# Patient Record
Sex: Male | Born: 1940
Health system: Southern US, Community
[De-identification: ages and names within clinical notes are randomized; demographics above are authoritative.]

## PROBLEM LIST (undated history)

## (undated) DIAGNOSIS — M25579 Pain in unspecified ankle and joints of unspecified foot: Secondary | ICD-10-CM

## (undated) DIAGNOSIS — N200 Calculus of kidney: Secondary | ICD-10-CM

## (undated) DIAGNOSIS — I4891 Unspecified atrial fibrillation: Secondary | ICD-10-CM

## (undated) DIAGNOSIS — E119 Type 2 diabetes mellitus without complications: Secondary | ICD-10-CM

## (undated) DIAGNOSIS — I2510 Atherosclerotic heart disease of native coronary artery without angina pectoris: Secondary | ICD-10-CM

## (undated) DIAGNOSIS — H269 Unspecified cataract: Secondary | ICD-10-CM

## (undated) DIAGNOSIS — M545 Low back pain: Secondary | ICD-10-CM

## (undated) DIAGNOSIS — E785 Hyperlipidemia, unspecified: Secondary | ICD-10-CM

## (undated) DIAGNOSIS — E669 Obesity, unspecified: Secondary | ICD-10-CM

## (undated) DIAGNOSIS — C649 Malignant neoplasm of unspecified kidney, except renal pelvis: Secondary | ICD-10-CM

## (undated) DIAGNOSIS — E039 Hypothyroidism, unspecified: Secondary | ICD-10-CM

## (undated) DIAGNOSIS — I10 Essential (primary) hypertension: Secondary | ICD-10-CM

## (undated) HISTORY — DX: Hyperlipidemia, unspecified: E78.5

## (undated) HISTORY — DX: Calculus of kidney: N20.0

## (undated) HISTORY — DX: Unspecified atrial fibrillation: I48.91

## (undated) HISTORY — DX: Obesity, unspecified: E66.9

## (undated) HISTORY — DX: Unspecified cataract: H26.9

## (undated) HISTORY — DX: Essential (primary) hypertension: I10

## (undated) HISTORY — PX: CORONARY ANGIOPLASTY WITH STENT PLACEMENT: SHX49

## (undated) HISTORY — DX: Type 2 diabetes mellitus without complications: E11.9

## (undated) HISTORY — DX: Malignant neoplasm of unspecified kidney, except renal pelvis: C64.9

## (undated) HISTORY — DX: Pain in unspecified ankle and joints of unspecified foot: M25.579

## (undated) HISTORY — DX: Low back pain: M54.5

## (undated) HISTORY — PX: ANKLE SURGERY: SHX546

## (undated) HISTORY — DX: Hypothyroidism, unspecified: E03.9

## (undated) HISTORY — DX: Atherosclerotic heart disease of native coronary artery without angina pectoris: I25.10

## (undated) HISTORY — PX: ROTATOR CUFF REPAIR: SHX139

---

## 2005-06-29 LAB — HM SIGMOIDOSCOPY: HM Sigmoidoscopy: NORMAL

## 2009-06-29 ENCOUNTER — Ambulatory Visit: Payer: Self-pay | Admitting: Gastroenterology

## 2010-09-05 DIAGNOSIS — E119 Type 2 diabetes mellitus without complications: Secondary | ICD-10-CM

## 2010-09-05 DIAGNOSIS — E039 Hypothyroidism, unspecified: Secondary | ICD-10-CM

## 2010-09-05 DIAGNOSIS — C649 Malignant neoplasm of unspecified kidney, except renal pelvis: Secondary | ICD-10-CM

## 2010-09-05 DIAGNOSIS — N200 Calculus of kidney: Secondary | ICD-10-CM

## 2010-09-05 DIAGNOSIS — I10 Essential (primary) hypertension: Secondary | ICD-10-CM

## 2010-09-05 HISTORY — DX: Hypothyroidism, unspecified: E03.9

## 2010-09-05 HISTORY — DX: Type 2 diabetes mellitus without complications: E11.9

## 2010-09-05 HISTORY — DX: Malignant neoplasm of unspecified kidney, except renal pelvis: C64.9

## 2010-09-05 HISTORY — DX: Calculus of kidney: N20.0

## 2010-09-05 HISTORY — DX: Essential (primary) hypertension: I10

## 2010-11-09 ENCOUNTER — Other Ambulatory Visit (HOSPITAL_COMMUNITY): Payer: Self-pay | Admitting: Urology

## 2010-11-09 ENCOUNTER — Ambulatory Visit (HOSPITAL_COMMUNITY)
Admission: RE | Admit: 2010-11-09 | Discharge: 2010-11-09 | Disposition: A | Discharge status: Home / Self Care | Payer: MEDICARE | Source: Ambulatory Visit | Attending: Urology | Admitting: Urology

## 2010-11-09 DIAGNOSIS — D4959 Neoplasm of unspecified behavior of other genitourinary organ: Secondary | ICD-10-CM | POA: Insufficient documentation

## 2010-11-09 DIAGNOSIS — I10 Essential (primary) hypertension: Secondary | ICD-10-CM | POA: Insufficient documentation

## 2010-11-09 DIAGNOSIS — R0602 Shortness of breath: Secondary | ICD-10-CM | POA: Insufficient documentation

## 2010-11-09 DIAGNOSIS — D49519 Neoplasm of unspecified behavior of unspecified kidney: Secondary | ICD-10-CM

## 2010-12-05 HISTORY — PX: KIDNEY SURGERY: SHX687

## 2010-12-28 ENCOUNTER — Encounter (HOSPITAL_COMMUNITY): Payer: MEDICARE | Attending: Urology

## 2010-12-28 LAB — BASIC METABOLIC PANEL
BUN: 20 mg/dL (ref 6–23)
Chloride: 100 mEq/L (ref 96–112)
Creatinine, Ser: 1.26 mg/dL (ref 0.4–1.5)
GFR calc Af Amer: >60 mL/min (ref >60)
GFR calc non Af Amer: 57 mL/min — ABNORMAL LOW (ref >60)
Potassium: 3.8 mEq/L (ref 3.5–5.1)

## 2010-12-28 LAB — CBC
Platelets: 355 10*3/uL (ref 150–400)
RBC: 5.1 MIL/uL (ref 4.22–5.81)
RDW: 14.2 % (ref 11.5–15.5)
WBC: 10.7 10*3/uL — ABNORMAL HIGH (ref 4.0–10.5)

## 2010-12-28 LAB — SURGICAL PCR SCREEN
MRSA, PCR: NEGATIVE
Staphylococcus aureus: NEGATIVE

## 2011-01-03 ENCOUNTER — Inpatient Hospital Stay (HOSPITAL_COMMUNITY)
Admission: RE | Admit: 2011-01-03 | Discharge: 2011-01-06 | DRG: 658 | Disposition: A | Discharge status: Home / Self Care | Payer: Medicare Other | Source: Ambulatory Visit | Attending: Urology | Admitting: Urology

## 2011-01-03 ENCOUNTER — Other Ambulatory Visit: Payer: Self-pay | Admitting: Urology

## 2011-01-03 DIAGNOSIS — I10 Essential (primary) hypertension: Secondary | ICD-10-CM | POA: Diagnosis present

## 2011-01-03 DIAGNOSIS — Z5331 Laparoscopic surgical procedure converted to open procedure: Secondary | ICD-10-CM

## 2011-01-03 DIAGNOSIS — E119 Type 2 diabetes mellitus without complications: Secondary | ICD-10-CM | POA: Diagnosis present

## 2011-01-03 DIAGNOSIS — J45909 Unspecified asthma, uncomplicated: Secondary | ICD-10-CM | POA: Diagnosis present

## 2011-01-03 DIAGNOSIS — C649 Malignant neoplasm of unspecified kidney, except renal pelvis: Principal | ICD-10-CM | POA: Diagnosis present

## 2011-01-03 DIAGNOSIS — E785 Hyperlipidemia, unspecified: Secondary | ICD-10-CM | POA: Diagnosis present

## 2011-01-03 DIAGNOSIS — K219 Gastro-esophageal reflux disease without esophagitis: Secondary | ICD-10-CM | POA: Diagnosis present

## 2011-01-03 DIAGNOSIS — E039 Hypothyroidism, unspecified: Secondary | ICD-10-CM | POA: Diagnosis present

## 2011-01-03 LAB — BASIC METABOLIC PANEL
BUN: 13 mg/dL (ref 6–23)
CO2: 24 mEq/L (ref 19–32)
Glucose, Bld: 140 mg/dL — ABNORMAL HIGH (ref 70–99)
Potassium: 3.9 mEq/L (ref 3.5–5.1)
Sodium: 131 mEq/L — ABNORMAL LOW (ref 135–145)

## 2011-01-03 LAB — GLUCOSE, CAPILLARY
Glucose-Capillary: 128 mg/dL — ABNORMAL HIGH (ref 70–99)
Glucose-Capillary: 129 mg/dL — ABNORMAL HIGH (ref 70–99)

## 2011-01-03 LAB — TYPE AND SCREEN: Antibody Screen: NEGATIVE

## 2011-01-03 LAB — ABO/RH: ABO/RH(D): A POS

## 2011-01-04 LAB — GLUCOSE, CAPILLARY
Glucose-Capillary: 103 mg/dL — ABNORMAL HIGH (ref 70–99)
Glucose-Capillary: 121 mg/dL — ABNORMAL HIGH (ref 70–99)
Glucose-Capillary: 125 mg/dL — ABNORMAL HIGH (ref 70–99)

## 2011-01-04 LAB — BASIC METABOLIC PANEL
BUN: 11 mg/dL (ref 6–23)
CO2: 25 mEq/L (ref 19–32)
Chloride: 97 mEq/L (ref 96–112)
Glucose, Bld: 114 mg/dL — ABNORMAL HIGH (ref 70–99)
Potassium: 3.8 mEq/L (ref 3.5–5.1)

## 2011-01-05 LAB — GLUCOSE, CAPILLARY
Glucose-Capillary: 119 mg/dL — ABNORMAL HIGH (ref 70–99)
Glucose-Capillary: 119 mg/dL — ABNORMAL HIGH (ref 70–99)
Glucose-Capillary: 125 mg/dL — ABNORMAL HIGH (ref 70–99)

## 2011-01-05 LAB — BASIC METABOLIC PANEL
BUN: 6 mg/dL (ref 6–23)
CO2: 27 mEq/L (ref 19–32)
Chloride: 95 mEq/L — ABNORMAL LOW (ref 96–112)
Creatinine, Ser: 0.95 mg/dL (ref 0.4–1.5)
GFR calc Af Amer: >60 mL/min (ref >60)

## 2011-01-07 LAB — GLUCOSE, CAPILLARY
Glucose-Capillary: 142 mg/dL — ABNORMAL HIGH (ref 70–99)
Glucose-Capillary: 118 mg/dL — ABNORMAL HIGH (ref 70–99)
Glucose-Capillary: 153 mg/dL — ABNORMAL HIGH (ref 70–99)

## 2011-01-08 NOTE — Op Note (Signed)
NAMEJEARL, Nicholas Rivera NO.:  [id number]  MEDICAL RECORD NO.:  [id number]           PATIENT TYPE:  I  LOCATION:  1418                         FACILITY:  [facility]  PHYSICIAN:  Heloise Purpura, MD      DATE OF BIRTH:  Nov 10, 1940  DATE OF PROCEDURE: DATE OF DISCHARGE:                              OPERATIVE REPORT   DATE OF PROCEDURE:  January 03, 2011  PREOPERATIVE DIAGNOSIS:  Left renal neoplasm.  POSTOPERATIVE DIAGNOSIS:  Left renal neoplasm.  PROCEDURE:  Left laparoscopic partial nephrectomy converted to open left partial nephrectomy.  SURGEON:  Dr. Heloise Purpura.  ASSISTANT:  Delia Chimes, NPC  ANESTHESIA:  General.  COMPLICATIONS:  None.  ESTIMATED BLOOD LOSS:  75 mL.  INTRAVENOUS FLUIDS:  3 liters of lactated Ringer.  SPECIMENS: 1. Left perinephric fat. 2. Left renal neoplasm. 3. Left renal tumor margins.  DISPOSITION:  Specimen to pathology.  INTRAOPERATIVE FINDINGS: 1. Intraoperative tumor margins were negative on frozen section     analysis. 2. Warm left renal ischemia times 18 minutes. 3. The left renal neoplasm was confirmed by Pathology to represent the     lesion of concern.  INDICATIONS:  [name] is a 70 year old gentleman who was found to have an incidentally detected enhancing left renal neoplasm concerning for malignancy.  He underwent a metastatic evaluation, which was negative. After discussing management options for treatment, he elected to proceed with surgical resection and a planned left robotic assisted laparoscopic partial nephrectomy.  The potential risks, complications and alternative treatment options were discussed in detail and informed consent obtained.  DESCRIPTION OF PROCEDURE:  Prior to bringing the patient back to the operating room, there was noted to be a fault with the robot.  This was therefore discussed with technical support and troubleshooting occurred.  It was felt that this was fixed with replacement  of a new camera cable. Utilizing the preoperative diagnostics, the robot appeared to be functioning well.  The patient was therefore brought back to the operating room and general anesthesia was induced. He was administered preoperative antibiotics and a preoperative time out was performed. His abdomen was prepped and draped in the usual sterile fashion.  At this point, a similar non-recoverable fault was identified.  Again technical support was contacted and despite further troubleshooting, it was felt that this represented a problem that could not be remedied immediately.  It was felt that it would require a new part, which would not be available for several hours.  It was therefore decided to proceed with surgery laparoscopically at this point.  A camera port site was selected off to the left of the umbilicus in the left upper quadrant and an open Hassan technique was used to enter the peritoneal cavity under direct vision without difficulty.  0 Vicryl holding sutures were placed and a 12-mm Hassan cannula was secured to these holding sutures.  A 30- degree lens was then used to inspect the abdomen, there were noted be multiple omental adhesions along the left side of the abdominal wall, thereby obscuring the left kidney and descending colon.  A 12 mm port was then  placed in the left lower quadrant and a 5-mm port was placed in the left upper quadrant.  The aforementioned adhesions were then taken down with a harmonic scalpel.  Once these adhesions were taken down, the colon was able to be identified and the white line of Toldt was incised along the length of the descending colon allowing the colon be mobilized medially in the space between the mesocolon and the anterior layer of Gerota's fascia to be developed.  The ureter was identified inferiorly and this was able to be lifted anteriorly off the psoas muscle. Dissection then proceeded superiorly toward the main renal hilum where  a single renal artery was identified and isolated with right-angle dissection.  A single left renal vein was also identified just superiorly to the artery.  Both of these vessels were isolated from the surrounding structures.  12.5 grams of intravenous mannitol was administered.  The perinephric fat was then incised over the anterior aspect of the kidney, allowing the inferior pole to be isolated from the surrounding perinephric fat.  However, the patient was noted to have very adherent perinephric fat in the anterior lower pole, which was the part of the kidney where the tumor was located, it was unable to be readily visualized despite extensive dissection.  At this point, it was felt that it would be in the patient's best interest to make an open incision for further evaluation of the kidney and subsequent resection. Therefore the previously placed laparoscopic ports were identified and closed with 0 Vicryl sutures placed laparoscopically prior to removal of these ports.  The 5 mm port in the left upper quadrant was then extended laterally in the subcostal region and a self-retaining Bookwalter retractor was placed.  The left kidney was further mobilized with the lateral and posterior attachments taken down with electrocautery.  The lower and anterior aspect of the left kidney was carefully examined and the patient's left renal tumor was still not well visualized although it was felt to be in the anterior lowe pole in a specific area only much more endophytic than expected based on his imaging.  Based on the patient's imaging, the tumor site was felt to be located just below some very adherent perinephric fat and the anterior aspect of the lower pole. With no further lesions identified, it was felt that this particular area represented the lesion of interest and preparations were made for excision.  The single renal artery was clamped with a bulldog clamp and cold scissor dissection was  used to excise the presumed tumor in the left lower pole.  This appeared to be excised in its entirety grossly and excision continued down to the renal sinus fat.  This specimen was then removed.  Additional margins from the representative section of the underlying parenchyma were excised and sent for frozen section analysis.  The frozen section subsequently returned negative for tumor.  I also asked Dr. Luisa Hart in Pathology to examine the excised tumor specimen grossly and he did confirm that there was a calcified cystic tumor within this parenchyma confirming that this was the lesion of concern. Preparations were then made for repair of the renal defect.  A running 4- 0 Vicryl suture was used to close the collecting system and this was secured with lap ties.  A running 2-0 Vicryl suture was then used to reapproximate the parenchyma again with lap ties used to secure these sutures.  A Surgicel bolster was then placed into the defect and FloSeal was placed underneath the  bolster.  The bolster was then secured with a double-armed 2-0 Monocryl suture, which was secured with a combination of 5 mm Hem-o-lok clips and lap ties.  This resulted in good compression of the renal defect.  The bulldog clamp was then removed and hemostasis appeared excellent.  Total warm ischemia time was 18 minutes.  An additional 12.5 grams of intravenous mannitol was then administered. The kidney was placed back into its normal anatomic position and the bowel was placed back over the kidney in its normal anatomic position.  A #15 round Blake drain was then brought through a separate stab incision in the left lower quadrant and positioned appropriately under direct guidance in the perinephric space.  It was secured to the skin with a nylon suture.  The layers of the abdominal wall and fascia were then closed in two separate layers.  The transversus abdominus and internal oblique fascial layers were closed with a  running looped #1 PDS suture. The external oblique fascia was then closed with a second running #1 PDS suture.  This wound was then copiously irrigated and reapproximated with a running 3-0 Vicryl subcutaneous layer.  4-0 Vicryl subcuticular sutures were then used to reapproximate the skin and Steri-Strips were applied.  The patient appeared to tolerate the procedure well without complications.  He was able to be extubated and transferred to the recovery unit in satisfactory condition.     Heloise Purpura, MD     LB/MEDQ  D:  01/03/2011  T:  01/04/2011  Job:  [id number]  Electronically Signed by Heloise Purpura MD on 01/08/2011 04:19:48 PM

## 2011-06-24 ENCOUNTER — Other Ambulatory Visit (HOSPITAL_COMMUNITY): Payer: Self-pay | Admitting: Urology

## 2011-06-24 ENCOUNTER — Ambulatory Visit (HOSPITAL_COMMUNITY)
Admission: RE | Admit: 2011-06-24 | Discharge: 2011-06-24 | Disposition: A | Discharge status: Home / Self Care | Payer: Medicare Other | Source: Ambulatory Visit | Attending: Urology | Admitting: Urology

## 2011-06-24 DIAGNOSIS — I10 Essential (primary) hypertension: Secondary | ICD-10-CM | POA: Insufficient documentation

## 2011-06-24 DIAGNOSIS — E119 Type 2 diabetes mellitus without complications: Secondary | ICD-10-CM | POA: Insufficient documentation

## 2011-06-24 DIAGNOSIS — C649 Malignant neoplasm of unspecified kidney, except renal pelvis: Secondary | ICD-10-CM | POA: Insufficient documentation

## 2011-09-06 DIAGNOSIS — H269 Unspecified cataract: Secondary | ICD-10-CM

## 2011-09-06 HISTORY — DX: Unspecified cataract: H26.9

## 2011-12-30 ENCOUNTER — Other Ambulatory Visit (HOSPITAL_COMMUNITY): Payer: Self-pay | Admitting: Urology

## 2011-12-30 ENCOUNTER — Ambulatory Visit (HOSPITAL_COMMUNITY)
Admission: RE | Admit: 2011-12-30 | Discharge: 2011-12-30 | Disposition: A | Discharge status: Home / Self Care | Payer: Medicare Other | Source: Ambulatory Visit | Attending: Urology | Admitting: Urology

## 2011-12-30 DIAGNOSIS — C649 Malignant neoplasm of unspecified kidney, except renal pelvis: Secondary | ICD-10-CM

## 2012-07-11 ENCOUNTER — Other Ambulatory Visit (HOSPITAL_COMMUNITY): Payer: Self-pay | Admitting: Urology

## 2012-07-11 ENCOUNTER — Ambulatory Visit (HOSPITAL_COMMUNITY)
Admission: RE | Admit: 2012-07-11 | Discharge: 2012-07-11 | Disposition: A | Discharge status: Home / Self Care | Payer: Medicare Other | Source: Ambulatory Visit | Attending: Urology | Admitting: Urology

## 2012-07-11 DIAGNOSIS — N401 Enlarged prostate with lower urinary tract symptoms: Secondary | ICD-10-CM

## 2012-07-11 DIAGNOSIS — N138 Other obstructive and reflux uropathy: Secondary | ICD-10-CM | POA: Insufficient documentation

## 2012-07-11 DIAGNOSIS — E119 Type 2 diabetes mellitus without complications: Secondary | ICD-10-CM | POA: Insufficient documentation

## 2012-07-11 DIAGNOSIS — I10 Essential (primary) hypertension: Secondary | ICD-10-CM | POA: Insufficient documentation

## 2012-07-11 DIAGNOSIS — R339 Retention of urine, unspecified: Secondary | ICD-10-CM | POA: Insufficient documentation

## 2012-09-03 DIAGNOSIS — M545 Low back pain, unspecified: Secondary | ICD-10-CM | POA: Insufficient documentation

## 2012-09-05 DIAGNOSIS — M545 Low back pain, unspecified: Secondary | ICD-10-CM

## 2012-09-05 HISTORY — DX: Low back pain, unspecified: M54.50

## 2012-12-25 ENCOUNTER — Ambulatory Visit (INDEPENDENT_AMBULATORY_CARE_PROVIDER_SITE_OTHER): Payer: Medicare Other | Admitting: Internal Medicine

## 2012-12-25 ENCOUNTER — Encounter: Payer: Self-pay | Admitting: Internal Medicine

## 2012-12-25 VITALS — BP 110/72 | HR 63 | Temp 98.2°F | Resp 12 | Ht 66.0 in | Wt 233.0 lb

## 2012-12-25 DIAGNOSIS — M533 Sacrococcygeal disorders, not elsewhere classified: Secondary | ICD-10-CM

## 2012-12-25 DIAGNOSIS — M545 Low back pain, unspecified: Secondary | ICD-10-CM

## 2012-12-25 DIAGNOSIS — E119 Type 2 diabetes mellitus without complications: Secondary | ICD-10-CM

## 2012-12-25 DIAGNOSIS — E039 Hypothyroidism, unspecified: Secondary | ICD-10-CM

## 2012-12-25 DIAGNOSIS — E785 Hyperlipidemia, unspecified: Secondary | ICD-10-CM

## 2012-12-25 DIAGNOSIS — I10 Essential (primary) hypertension: Secondary | ICD-10-CM | POA: Insufficient documentation

## 2012-12-25 DIAGNOSIS — R972 Elevated prostate specific antigen [PSA]: Secondary | ICD-10-CM

## 2012-12-25 DIAGNOSIS — E1169 Type 2 diabetes mellitus with other specified complication: Secondary | ICD-10-CM | POA: Insufficient documentation

## 2012-12-25 DIAGNOSIS — C649 Malignant neoplasm of unspecified kidney, except renal pelvis: Secondary | ICD-10-CM

## 2012-12-25 DIAGNOSIS — N209 Urinary calculus, unspecified: Secondary | ICD-10-CM

## 2012-12-25 DIAGNOSIS — E669 Obesity, unspecified: Secondary | ICD-10-CM

## 2012-12-25 MED ORDER — OMEPRAZOLE 20 MG PO CPDR: DELAYED_RELEASE_CAPSULE | ORAL | Status: DC

## 2012-12-25 MED ORDER — METFORMIN HCL 500 MG PO TABS: ORAL_TABLET | ORAL | Status: DC

## 2012-12-25 NOTE — Patient Instructions (Signed)
Continue current medications. 

## 2012-12-25 NOTE — Progress Notes (Signed)
Date: 12/25/2012  MRN:  [id number] Name:  Nicholas Rivera Sex:  male Age:  72 y.o. DOB:1941-08-03   Level Of Care: Independent   Emergency Contacts: Contact Information   Name Relation Home Work Mobile   Nicholas Rivera Spouse [phone]     Nicholas, Rivera [phone]  [phone]      Code Status: Full code  Allergies:No Known Allergies   Chief Complaint  Patient presents with  . establish with practice     HPI:  Diabetes mellitus without complication: generally under good control  Hypertension: Controlled  Unspecified hypothyroidism: Has been normal on supplements  Cancer of kidney, unspecified laterality: Removed surgically by Dr. Laverle Patter. No signs of relapse.  Obesity, unspecified: Denies recent weight gain or loss. He did weigh less than 200 pounds in 2009  Lumbar back pain: This renal lower back since he fell in his care. His muscle relaxer. No radiation of pain in the legs. Eating has helped.  Other and unspecified hyperlipidemia  PSA elevation    Past Medical History  Diagnosis Date  . Diabetes mellitus without complication 2012  . Hypertension 2012  . Unspecified hypothyroidism 2012  . Nephrolithiasis 2012  . Cancer of kidney 2012  . Obesity, unspecified   . Cataract 2013  . Other and unspecified hyperlipidemia 04/03/2013  . Back pain, lumbosacral 2014    Past Surgical History  Procedure Laterality Date  . Kidney surgery  12/2010    had cancer removed; Dr. Laverle Patter  . Rotator cuff repair      bilateral; Dr. Ranell Patrick     Procedures:   Consultants: Urology: Dr. Laverle Patter Orthopedics: Dr. Ranell Patrick   Current Outpatient Prescriptions  Medication Sig Dispense Refill  . aspirin 325 MG tablet Take 325 mg by mouth daily. Take one tablet by mouth daily.      . bisoprolol-hydrochlorothiazide (ZIAC) 5-6.25 MG per tablet Take 1 tablet by mouth daily. Take two tablets by mouth twice daily.      Marland Kitchen levothyroxine (SYNTHROID, LEVOTHROID) 50 MCG tablet  Take 50 mcg by mouth daily before breakfast. Take one tablet by mouth daily.      Marland Kitchen losartan (COZAAR) 100 MG tablet Take 100 mg by mouth daily. Take one tablet by mouth daily.      . metFORMIN (GLUCOPHAGE) 500 MG tablet Take one tablet by mouth twice daily.  180 tablet  4  . Multiple Vitamins-Minerals (MULTIVITAMIN WITH MINERALS) tablet Take 1 tablet by mouth daily. Take one tablet by mouth daily.      Marland Kitchen omeprazole (PRILOSEC) 20 MG capsule Take one capsule by mouth daily to reduce stomach acid.  90 capsule  4  . potassium citrate (UROCIT-K) 10 MEQ (1080 MG) SR tablet Take 10 mEq by mouth 3 (three) times daily with meals. Take two tablets by mouth twice daily.      Marland Kitchen RASPBERRY KETONES PO Take by mouth. Take two tablets by mouth in the morning and two tablets by mouth in the evening for metabolism.      . vitamin C (ASCORBIC ACID) 500 MG tablet Take 500 mg by mouth daily. Take two tablets by mouth daily.       No current facility-administered medications for this visit.    Immunization History  Administered Date(s) Administered  . Influenza-Generic 05/06/2012  . Pneumococcal Polysaccharide 09/05/1998  . Td 09/06/2003     Diet:  Low carbohydrate, no concentrated sweets  History  Substance Use Topics  . Smoking status: Never Smoker   . Smokeless tobacco: Not on file  .  Alcohol Use: No    Family Status  Relation Status Death Age  . Mother Deceased 12    choked  . Father Deceased 56    car accident  . Daughter Alive   . Son Alive       Review of Systems  Constitutional: Negative for fever, chills, activity change and fatigue.  HENT: Negative.   Eyes: Negative.   Respiratory: Negative.   Cardiovascular: Negative for chest pain, palpitations and leg swelling.  Gastrointestinal: Negative.   Endocrine:       Diabetic  Genitourinary: Negative for dysuria, urgency, decreased urine volume and difficulty urinating.       History of kidney cancer removed by Dr. Laverle Patter. History of  bladder stones.  Musculoskeletal:       Low back discomfort. History of bilateral rotator cuff surgery by Dr. Ranell Patrick.  Skin: Negative.   Allergic/Immunologic: Negative.   Neurological: Negative.   Hematological: Negative.   Psychiatric/Behavioral: Negative.      Vital signs: BP 110/72  Pulse 63  Temp(Src) 98.2 F (36.8 C) (Oral)  Resp 12  Ht 5\' 6"  (1.676 m)  Wt 233 lb (105.688 kg)  BMI 37.63 kg/m2  SpO2 97%  Physical Exam  Constitutional: He is oriented to person, place, and time and well-developed, well-nourished, and in no distress.  Moderately overweight.  HENT:  Head: Normocephalic and atraumatic.  Right Ear: External ear normal.  Left Ear: External ear normal.  Nose: Nose normal.  Mouth/Throat: Oropharynx is clear and moist.  Eyes: Conjunctivae and EOM are normal. Pupils are equal, round, and reactive to light.  Corrective lenses  Neck: Neck supple. No JVD present. No tracheal deviation present. No thyromegaly present.  Cardiovascular: Normal rate, regular rhythm, normal heart sounds and intact distal pulses.  Exam reveals no gallop and no friction rub.   No murmur heard. Pulmonary/Chest: Breath sounds normal. No respiratory distress. He has no wheezes. He has no rales.  Abdominal: Bowel sounds are normal. He exhibits no distension. There is no tenderness.  Genitourinary: Rectum normal, prostate normal and penis normal. Guaiac negative stool.  Musculoskeletal: He exhibits no edema and no tenderness.  Mild restriction of movement of the shoulders. Mild back discomfort without restriction of movement.  Lymphadenopathy:    He has no cervical adenopathy.  Neurological: He is oriented to person, place, and time. He has normal reflexes. No cranial nerve deficit. Gait normal. Coordination normal.  Skin: No rash noted. No erythema. No pallor.  Psychiatric: Mood, memory, affect and judgment normal.     Screening Score  MMS    PHQ2 0  PHQ9     Fall Risk    BIMS     Annual summary: Hospitalizations: None last year Infection History: None of significance Functional assessment: Independent in all ADL Areas of potential improvement: None likely Rehabilitation Potential: Not needed Prognosis for survival: Good  Plan: Diabetes mellitus without complication: Continue dietary control and metformin - Plan: Hemoglobin A1c, Lipid Panel, CMP, Microalbumin/Creatinine Ratio, Urine  Hypertension: Controlled  Unspecified hypothyroidism: Controlled on supplements - Plan: TSH  Cancer of kidney, unspecified laterality: No sign of relapse  Obesity, unspecified: Encouraged weight loss and dietary control of portion size and elimination of extra carbohydrates  Lumbago: Continue with mild analgesics as needed. Given advice about back exercises.  Other and unspecified hyperlipidemia: Controlled  PSA elevation: Routine followup with future lab  Urolithiasis: History of bladder stones. Currently asymptomatic.

## 2012-12-26 ENCOUNTER — Other Ambulatory Visit: Payer: Self-pay | Admitting: *Deleted

## 2012-12-26 DIAGNOSIS — I10 Essential (primary) hypertension: Secondary | ICD-10-CM

## 2012-12-26 DIAGNOSIS — E785 Hyperlipidemia, unspecified: Secondary | ICD-10-CM

## 2012-12-26 DIAGNOSIS — E119 Type 2 diabetes mellitus without complications: Secondary | ICD-10-CM

## 2013-01-18 ENCOUNTER — Other Ambulatory Visit (HOSPITAL_COMMUNITY): Payer: Self-pay | Admitting: Urology

## 2013-01-18 ENCOUNTER — Ambulatory Visit (HOSPITAL_COMMUNITY)
Admission: RE | Admit: 2013-01-18 | Discharge: 2013-01-18 | Disposition: A | Discharge status: Home / Self Care | Payer: Medicare Other | Source: Ambulatory Visit | Attending: Urology | Admitting: Urology

## 2013-01-18 DIAGNOSIS — C649 Malignant neoplasm of unspecified kidney, except renal pelvis: Secondary | ICD-10-CM

## 2013-03-28 ENCOUNTER — Other Ambulatory Visit: Payer: Self-pay | Admitting: Internal Medicine

## 2013-03-28 ENCOUNTER — Other Ambulatory Visit: Payer: Medicare Other

## 2013-04-03 ENCOUNTER — Ambulatory Visit (INDEPENDENT_AMBULATORY_CARE_PROVIDER_SITE_OTHER): Payer: Medicare Other | Admitting: Internal Medicine

## 2013-04-03 ENCOUNTER — Encounter: Payer: Self-pay | Admitting: Internal Medicine

## 2013-04-03 VITALS — BP 108/70 | HR 64 | Temp 97.8°F | Resp 18 | Ht 66.0 in | Wt 231.4 lb

## 2013-04-03 DIAGNOSIS — C642 Malignant neoplasm of left kidney, except renal pelvis: Secondary | ICD-10-CM

## 2013-04-03 DIAGNOSIS — R972 Elevated prostate specific antigen [PSA]: Secondary | ICD-10-CM

## 2013-04-03 DIAGNOSIS — M545 Low back pain, unspecified: Secondary | ICD-10-CM

## 2013-04-03 DIAGNOSIS — E039 Hypothyroidism, unspecified: Secondary | ICD-10-CM

## 2013-04-03 DIAGNOSIS — E785 Hyperlipidemia, unspecified: Secondary | ICD-10-CM

## 2013-04-03 DIAGNOSIS — E669 Obesity, unspecified: Secondary | ICD-10-CM

## 2013-04-03 DIAGNOSIS — C649 Malignant neoplasm of unspecified kidney, except renal pelvis: Secondary | ICD-10-CM

## 2013-04-03 DIAGNOSIS — I10 Essential (primary) hypertension: Secondary | ICD-10-CM

## 2013-04-03 DIAGNOSIS — E119 Type 2 diabetes mellitus without complications: Secondary | ICD-10-CM

## 2013-04-03 HISTORY — DX: Hyperlipidemia, unspecified: E78.5

## 2013-04-03 MED ORDER — LEVOTHYROXINE SODIUM 50 MCG PO TABS: 50.0000 ug | ORAL_TABLET | Freq: Every day | ORAL | Status: DC

## 2013-04-03 MED ORDER — LOSARTAN POTASSIUM 100 MG PO TABS: 100.0000 mg | ORAL_TABLET | Freq: Every day | ORAL | Status: DC

## 2013-04-03 MED ORDER — BISOPROLOL-HYDROCHLOROTHIAZIDE 5-6.25 MG PO TABS: 2.0000 | ORAL_TABLET | Freq: Every day | ORAL | Status: DC

## 2013-04-03 NOTE — Patient Instructions (Signed)
Continue current medications. 

## 2013-04-03 NOTE — Progress Notes (Signed)
Subjective:    Patient ID: Nicholas Rivera, male    DOB: 1941-07-18, 72 y.o.   MRN: [id number]  HPI  Diabetes mellitus without complication: under good control  Hypertension: Controlled  Unspecified hypothyroidism: Controlled  Obesity, unspecified:Lost 2#. Has been trying to lose weight.  Cancer of kidney, left: Dr. Verlee Rossetti. No symptoms at present.  Lumbago -started about one month ago when he fell from his  camper truck  PSA elevation -Urologist requested patient have repeat PSA.Marland Kitchen History of BPH.  Other and unspecified hyperlipidemia: Modest elevation in LDL  Current Outpatient Prescriptions on File Prior to Visit  Medication Sig Dispense Refill  . vitamin C (ASCORBIC ACID) 500 MG tablet Take 500 mg by mouth daily. Take two tablets by mouth daily.      Marland Kitchen aspirin 325 MG tablet Take 325 mg by mouth daily. Take one tablet by mouth daily.      . metFORMIN (GLUCOPHAGE) 500 MG tablet Take one tablet by mouth twice daily.  180 tablet  4  . Multiple Vitamins-Minerals (MULTIVITAMIN WITH MINERALS) tablet Take 1 tablet by mouth daily. Take one tablet by mouth daily.      Marland Kitchen omeprazole (PRILOSEC) 20 MG capsule Take one capsule by mouth daily to reduce stomach acid.  90 capsule  4  . potassium citrate (UROCIT-K) 10 MEQ (1080 MG) SR tablet Take 10 mEq by mouth 3 (three) times daily with meals. Take two tablets by mouth twice daily.      Marland Kitchen RASPBERRY KETONES PO Take by mouth. Take two tablets by mouth in the morning and two tablets by mouth in the evening for metabolism.       No current facility-administered medications on file prior to visit.     Review of Systems  Constitutional:       Obese  HENT: Negative.   Eyes: Negative.   Respiratory: Negative.   Cardiovascular: Negative for chest pain, palpitations and leg swelling.  Gastrointestinal: Negative for abdominal pain and abdominal distention.  Endocrine: Negative for cold intolerance, heat intolerance, polydipsia, polyphagia and  polyuria.       Hx DM and hypothyroidism.  Genitourinary:       Hx Ca lef tkidney and removal surgically. Hx BPH. Hx elevated PSA.  Musculoskeletal: Positive for back pain.  Skin: Negative.   Neurological: Negative.   Hematological: Negative.   Psychiatric/Behavioral: Negative.        Objective:   Physical Exam  Constitutional: He is oriented to person, place, and time. No distress.  Obese.  HENT:  Head: Normocephalic.  Right Ear: External ear normal.  Left Ear: External ear normal.  Eyes: Conjunctivae and EOM are normal. Pupils are equal, round, and reactive to light.  Corrective lenses.  Neck: Normal range of motion. Neck supple. No JVD present. No tracheal deviation present. No thyromegaly present.  Cardiovascular: Normal rate, regular rhythm, normal heart sounds and intact distal pulses.  Exam reveals no gallop and no friction rub.   No murmur heard. Pulmonary/Chest: Effort normal and breath sounds normal. No respiratory distress. He has no wheezes. He has no rales. He exhibits no tenderness.  Abdominal: Soft. Bowel sounds are normal. He exhibits no distension and no mass. There is no tenderness.  Musculoskeletal: Normal range of motion. He exhibits edema. He exhibits no tenderness.  Lymphadenopathy:    He has no cervical adenopathy.  Neurological: He is alert and oriented to person, place, and time. No cranial nerve deficit. Coordination normal.  Skin: Skin is warm and dry. No  rash noted. No erythema. No pallor.  Psychiatric: He has a normal mood and affect. His behavior is normal. Judgment and thought content normal.      LAB REVIEWED 03/28/13 CMP: glu 124, otherwise normal  Lipid: TC 213, trig 183, HDL 42, LDL 134  TSH 3.070  Urine microalbumin 3.1  A1c 6.4    Assessment & Plan:  Diabetes mellitus without complication - Plan: Hemoglobin A1c, Basic metabolic panel  Hypertension: controlled  Unspecified hypothyroidism; controlled  Obesity, unspecified: continue  weight loss. Has tried Atkins diet, but did not like it. Suggested he try [facility] Diet  Cancer of kidney, left: no relapse  Lumbago : started after a fall 18 mo ago. Never had a back pain last this long.  - Plan: DG Lumbar Spine Complete  PSA elevation - Plan: PSA  Other and unspecified hyperlipidemia - Plan: Lipid panel

## 2013-04-05 LAB — COMPREHENSIVE METABOLIC PANEL
ALT: 16 IU/L (ref 0–44)
AST: 18 IU/L (ref 0–40)
Alkaline Phosphatase: 99 IU/L (ref 39–117)
BUN/Creatinine Ratio: 14 (ref 10–22)
CO2: 22 mmol/L (ref 18–29)
Calcium: 10.1 mg/dL (ref 8.6–10.2)
Globulin, Total: 2.4 g/dL (ref 1.5–4.5)
Potassium: 4.4 mmol/L (ref 3.5–5.2)
Sodium: 140 mmol/L (ref 134–144)

## 2013-04-05 LAB — LIPID PANEL WITH LDL/HDL RATIO
HDL: 42 mg/dL (ref >39)
LDl/HDL Ratio: 3.2 ratio units (ref 0.0–3.6)
Triglycerides: 183 mg/dL — ABNORMAL HIGH (ref 0–149)

## 2013-04-05 LAB — TSH: TSH: 3.07 u[IU]/mL (ref 0.450–4.500)

## 2013-04-05 LAB — HGB A1C W/O EAG: Hgb A1c MFr Bld: 6.4 % — ABNORMAL HIGH (ref 4.8–5.6)

## 2013-04-08 LAB — PSA: PSA: 3.5 ng/mL (ref 0.0–4.0)

## 2013-04-08 LAB — SPECIMEN STATUS REPORT

## 2013-04-27 ENCOUNTER — Encounter: Payer: Self-pay | Admitting: Internal Medicine

## 2013-04-27 DIAGNOSIS — N209 Urinary calculus, unspecified: Secondary | ICD-10-CM | POA: Insufficient documentation

## 2013-04-27 DIAGNOSIS — M545 Low back pain, unspecified: Secondary | ICD-10-CM | POA: Insufficient documentation

## 2013-07-09 ENCOUNTER — Encounter: Payer: Self-pay | Admitting: Internal Medicine

## 2013-07-09 ENCOUNTER — Ambulatory Visit (INDEPENDENT_AMBULATORY_CARE_PROVIDER_SITE_OTHER): Payer: Medicare Other | Admitting: Internal Medicine

## 2013-07-09 VITALS — BP 118/72 | HR 76 | Wt 229.4 lb

## 2013-07-09 DIAGNOSIS — W540XXA Bitten by dog, initial encounter: Secondary | ICD-10-CM

## 2013-07-09 DIAGNOSIS — S2195XA Open bite of unspecified part of thorax, initial encounter: Secondary | ICD-10-CM | POA: Insufficient documentation

## 2013-07-09 DIAGNOSIS — E669 Obesity, unspecified: Secondary | ICD-10-CM

## 2013-07-09 DIAGNOSIS — I10 Essential (primary) hypertension: Secondary | ICD-10-CM

## 2013-07-09 DIAGNOSIS — IMO0002 Reserved for concepts with insufficient information to code with codable children: Secondary | ICD-10-CM

## 2013-07-09 MED ORDER — MUPIROCIN 2 % EX OINT: TOPICAL_OINTMENT | CUTANEOUS | Status: DC

## 2013-07-09 NOTE — Progress Notes (Signed)
Subjective:    Patient ID: Nicholas Rivera, male    DOB: May 27, 1941, 72 y.o.   MRN: [id number]  Chief Complaint  Patient presents with  . Animal Bite    Patient c/o dog bite(Son's dog) on left side of stomach  x 2 weeks ago     HPI Bitten by his son's dog 2 weeks ago. Used Neosporin. Got worse like an allergy. Switched to Betadine 3 days ago..Dog has had all its shots including rabies.  Home glucose about 120's.  Bp is stable and controlled  Immunization History  Administered Date(s) Administered  . Influenza-Generic 05/06/2012, 06/05/2013  . Pneumococcal Polysaccharide 09/05/1998  . Td 09/06/2003     Current Outpatient Prescriptions on File Prior to Visit  Medication Sig Dispense Refill  . aspirin 325 MG tablet Take 325 mg by mouth daily. Take one tablet by mouth daily.      . bisoprolol-hydrochlorothiazide (ZIAC) 5-6.25 MG per tablet Take 2 tablets by mouth daily. Take two tablets by mouth daily.  180 tablet  3  . levothyroxine (SYNTHROID, LEVOTHROID) 50 MCG tablet Take 1 tablet (50 mcg total) by mouth daily before breakfast. Take one tablet by mouth daily.  90 tablet  3  . losartan (COZAAR) 100 MG tablet Take 1 tablet (100 mg total) by mouth daily. Take one tablet by mouth daily.  90 tablet  3  . metFORMIN (GLUCOPHAGE) 500 MG tablet Take one tablet by mouth twice daily.  180 tablet  4  . Multiple Vitamins-Minerals (MULTIVITAMIN WITH MINERALS) tablet Take 1 tablet by mouth daily. Take one tablet by mouth daily.      Marland Kitchen omeprazole (PRILOSEC) 20 MG capsule Take one capsule by mouth daily to reduce stomach acid.  90 capsule  4  . potassium citrate (UROCIT-K) 10 MEQ (1080 MG) SR tablet Take 10 mEq by mouth 3 (three) times daily with meals. Take two tablets by mouth twice daily.      . vitamin C (ASCORBIC ACID) 500 MG tablet Take 500 mg by mouth daily. Take two tablets by mouth daily.       No current facility-administered medications on file prior to visit.    Review of Systems   Constitutional:       Obese  HENT: Positive for hearing loss.   Eyes: Negative.   Respiratory: Negative.   Cardiovascular: Negative for chest pain, palpitations and leg swelling.  Gastrointestinal: Negative for abdominal pain and abdominal distention.  Endocrine: Negative for cold intolerance, heat intolerance, polydipsia, polyphagia and polyuria.       Hx DM and hypothyroidism.  Genitourinary:       Hx Ca lef tkidney and removal surgically. Hx BPH. Hx elevated PSA.  Musculoskeletal: Positive for back pain.  Skin:       Spreading rash on the left lower quadrant of the abdomen. Skin has dried in areas. No residual puncture marks from the dog bite.  Neurological: Negative.   Hematological: Negative.   Psychiatric/Behavioral: Negative.        Objective:BP 118/72  Pulse 76  Wt 229 lb 6.4 oz (104.055 kg)  SpO2 97%    Physical Exam  Constitutional: He is oriented to person, place, and time. No distress.  Obese.  HENT:  Head: Normocephalic.  Right Ear: External ear normal.  Left Ear: External ear normal.  Eyes: Conjunctivae and EOM are normal. Pupils are equal, round, and reactive to light.  Corrective lenses.  Neck: Normal range of motion. Neck supple. No JVD present. No tracheal deviation  present. No thyromegaly present.  Cardiovascular: Normal rate, regular rhythm, normal heart sounds and intact distal pulses.  Exam reveals no gallop and no friction rub.   No murmur heard. Pulmonary/Chest: Effort normal and breath sounds normal. No respiratory distress. He has no wheezes. He has no rales. He exhibits no tenderness.  Abdominal: Soft. Bowel sounds are normal. He exhibits no distension and no mass. There is no tenderness.  Musculoskeletal: Normal range of motion. He exhibits edema. He exhibits no tenderness.  Lymphadenopathy:    He has no cervical adenopathy.  Neurological: He is alert and oriented to person, place, and time. No cranial nerve deficit. Coordination normal.  Skin:  Rash noted. There is erythema.  Spreading rash of the left lower abd quadrant. Dry patches. No residual puncture marks.  Psychiatric: He has a normal mood and affect. His behavior is normal. Judgment and thought content normal.          Assessment & Plan:  Dog bite of trunk, initial encounter - Plan: mupirocin ointment (BACTROBAN) 2 %  Obesity, unspecified: he is planning to go to a vegetarian diet to lose weight  Hypertension: controlled

## 2013-07-09 NOTE — Patient Instructions (Signed)
Continue current medications. Try Bactroban to affected area of skin.

## 2013-07-31 ENCOUNTER — Other Ambulatory Visit: Payer: Medicare Other

## 2013-07-31 DIAGNOSIS — E119 Type 2 diabetes mellitus without complications: Secondary | ICD-10-CM

## 2013-07-31 DIAGNOSIS — I10 Essential (primary) hypertension: Secondary | ICD-10-CM

## 2013-07-31 DIAGNOSIS — E785 Hyperlipidemia, unspecified: Secondary | ICD-10-CM

## 2013-08-01 LAB — HEMOGLOBIN A1C
Est. average glucose Bld gHb Est-mCnc: 137 mg/dL
Hgb A1c MFr Bld: 6.4 % — ABNORMAL HIGH (ref 4.8–5.6)

## 2013-08-01 LAB — COMPREHENSIVE METABOLIC PANEL
ALT: 16 IU/L (ref 0–44)
AST: 22 IU/L (ref 0–40)
Alkaline Phosphatase: 85 IU/L (ref 39–117)
BUN/Creatinine Ratio: 13 (ref 10–22)
CO2: 25 mmol/L (ref 18–29)
Calcium: 9.8 mg/dL (ref 8.6–10.2)
Chloride: 99 mmol/L (ref 97–108)
Sodium: 140 mmol/L (ref 134–144)

## 2013-08-01 LAB — MICROALBUMIN / CREATININE URINE RATIO
MICROALB/CREAT RATIO: <2.9 mg/g creat (ref 0.0–30.0)
Microalbumin, Urine: <3 ug/mL (ref 0.0–17.0)

## 2013-08-01 LAB — LIPID PANEL
Cholesterol, Total: 233 mg/dL — ABNORMAL HIGH (ref 100–199)
HDL: 44 mg/dL (ref >39)
Triglycerides: 279 mg/dL — ABNORMAL HIGH (ref 0–149)

## 2013-08-06 ENCOUNTER — Ambulatory Visit (INDEPENDENT_AMBULATORY_CARE_PROVIDER_SITE_OTHER): Payer: Medicare Other | Admitting: Internal Medicine

## 2013-08-06 ENCOUNTER — Encounter: Payer: Self-pay | Admitting: Internal Medicine

## 2013-08-06 VITALS — BP 140/78 | HR 56 | Temp 97.8°F | Resp 14 | Wt 236.6 lb

## 2013-08-06 DIAGNOSIS — E785 Hyperlipidemia, unspecified: Secondary | ICD-10-CM

## 2013-08-06 DIAGNOSIS — M545 Low back pain, unspecified: Secondary | ICD-10-CM

## 2013-08-06 DIAGNOSIS — I10 Essential (primary) hypertension: Secondary | ICD-10-CM

## 2013-08-06 DIAGNOSIS — N209 Urinary calculus, unspecified: Secondary | ICD-10-CM

## 2013-08-06 DIAGNOSIS — E119 Type 2 diabetes mellitus without complications: Secondary | ICD-10-CM

## 2013-08-06 DIAGNOSIS — E039 Hypothyroidism, unspecified: Secondary | ICD-10-CM

## 2013-08-06 DIAGNOSIS — E669 Obesity, unspecified: Secondary | ICD-10-CM

## 2013-08-06 MED ORDER — ATENOLOL 50 MG PO TABS: ORAL_TABLET | ORAL | Status: DC

## 2013-08-06 NOTE — Progress Notes (Signed)
Subjective:    Patient ID: Nicholas Rivera, male    DOB: [birth year], 72 y.o.   MRN: [id number]  Chief Complaint  Patient presents with  . Follow-up    4 month f/u with labs printed  . other    ? about changing BP medication  . other    colonoscopy done in 2006 @ [facility]     HPI  Hypertension: controlled  Diabetes mellitus without complication; controlled  Lumbago: more than usual pain today.  Obesity, unspecified: gained 6# since last visit  Other and unspecified hyperlipidemia: moderate elevation in the LDL  Unspecified hypothyroidism; compensated  Prior dog bite area on the abd is still red, but is healed.    Current Outpatient Prescriptions on File Prior to Visit  Medication Sig Dispense Refill  . aspirin 325 MG tablet Take 325 mg by mouth daily. Take one tablet by mouth daily.      . bisoprolol-hydrochlorothiazide (ZIAC) 5-6.25 MG per tablet Take 2 tablets by mouth daily. Take two tablets by mouth daily.  180 tablet  3  . levothyroxine (SYNTHROID, LEVOTHROID) 50 MCG tablet Take 1 tablet (50 mcg total) by mouth daily before breakfast. Take one tablet by mouth daily.  90 tablet  3  . losartan (COZAAR) 100 MG tablet Take 1 tablet (100 mg total) by mouth daily. Take one tablet by mouth daily.  90 tablet  3  . metFORMIN (GLUCOPHAGE) 500 MG tablet Take one tablet by mouth twice daily.  180 tablet  4  . Multiple Vitamins-Minerals (MULTIVITAMIN WITH MINERALS) tablet Take 1 tablet by mouth daily. Take one tablet by mouth daily.      . mupirocin ointment (BACTROBAN) 2 % Apply twice daily to affected area.  22 g  0  . omeprazole (PRILOSEC) 20 MG capsule Take one capsule by mouth daily to reduce stomach acid.  90 capsule  4  . potassium citrate (UROCIT-K) 10 MEQ (1080 MG) SR tablet Take 10 mEq by mouth 3 (three) times daily with meals. Take two tablets by mouth twice daily.      . vitamin C (ASCORBIC ACID) 500 MG tablet Take 500 mg by mouth daily. Take two tablets by  mouth daily.       No current facility-administered medications on file prior to visit.    Review of Systems  Constitutional:       Obese  HENT: Positive for hearing loss.   Eyes: Negative.   Respiratory: Negative.   Cardiovascular: Negative for chest pain, palpitations and leg swelling.  Gastrointestinal: Negative for abdominal pain and abdominal distention.  Endocrine: Negative for cold intolerance, heat intolerance, polydipsia, polyphagia and polyuria.       Hx DM and hypothyroidism.  Genitourinary:       Hx Ca left tkidney and removal surgically. Hx BPH. Hx elevated PSA.  Musculoskeletal: Positive for back pain.  Skin:       Spreading rash on the left lower quadrant of the abdomen. Skin has dried in areas. No residual puncture marks from the dog bite.  Neurological: Negative.   Hematological: Negative.   Psychiatric/Behavioral: Negative.        Objective:BP 140/78  Pulse 56  Temp(Src) 97.8 F (36.6 C) (Oral)  Resp 14  Wt 236 lb 9.6 oz (107.321 kg)  SpO2 97%    Physical Exam  Constitutional: He is oriented to person, place, and time. No distress.  Obese.  HENT:  Head: Normocephalic.  Right Ear: External ear normal.  Left Ear: External  ear normal.  Eyes: Conjunctivae and EOM are normal. Pupils are equal, round, and reactive to light.  Corrective lenses.  Neck: Normal range of motion. Neck supple. No JVD present. No tracheal deviation present. No thyromegaly present.  Cardiovascular: Normal rate, regular rhythm, normal heart sounds and intact distal pulses.  Exam reveals no gallop and no friction rub.   No murmur heard. Pulmonary/Chest: Effort normal and breath sounds normal. No respiratory distress. He has no wheezes. He has no rales. He exhibits no tenderness.  Abdominal: Soft. Bowel sounds are normal. He exhibits no distension and no mass. There is no tenderness.  Musculoskeletal: Normal range of motion. He exhibits edema. He exhibits no tenderness.    Lymphadenopathy:    He has no cervical adenopathy.  Neurological: He is alert and oriented to person, place, and time. No cranial nerve deficit. Coordination normal.  Skin: No rash noted. There is erythema.  Psychiatric: He has a normal mood and affect. His behavior is normal. Judgment and thought content normal.    Appointment on 07/31/2013  Component Date Value Range Status  . Hemoglobin A1C 07/31/2013 6.4* 4.8 - 5.6 % Final   Comment:          Increased risk for diabetes: 5.7 - 6.4                                   Diabetes: >6.4                                   Glycemic control for adults with diabetes: <7.0  . Estimated average glucose 07/31/2013 137   Final  . Glucose 07/31/2013 102* 65 - 99 mg/dL Final  . BUN 16/06/9603 14  8 - 27 mg/dL Final  . Creatinine, Ser 07/31/2013 1.05  0.76 - 1.27 mg/dL Final  . GFR calc non Af Amer 07/31/2013 72  >59 mL/min/1.73 Final  . GFR calc Af Amer 07/31/2013 83  >59 mL/min/1.73 Final  . BUN/Creatinine Ratio 07/31/2013 13  10 - 22 Final  . Sodium 07/31/2013 140  134 - 144 mmol/L Final  . Potassium 07/31/2013 4.8  3.5 - 5.2 mmol/L Final  . Chloride 07/31/2013 99  97 - 108 mmol/L Final  . CO2 07/31/2013 25  18 - 29 mmol/L Final  . Calcium 07/31/2013 9.8  8.6 - 10.2 mg/dL Final  . Total Protein 07/31/2013 6.4  6.0 - 8.5 g/dL Final  . Albumin 54/05/8118 4.1  3.5 - 4.8 g/dL Final  . Globulin, Total 07/31/2013 2.3  1.5 - 4.5 g/dL Final  . Albumin/Globulin Ratio 07/31/2013 1.8  1.1 - 2.5 Final  . Total Bilirubin 07/31/2013 0.4  0.0 - 1.2 mg/dL Final  . Alkaline Phosphatase 07/31/2013 85  39 - 117 IU/L Final  . AST 07/31/2013 22  0 - 40 IU/L Final  . ALT 07/31/2013 16  0 - 44 IU/L Final  . Cholesterol, Total 07/31/2013 233* 100 - 199 mg/dL Final  . Triglycerides 07/31/2013 279* 0 - 149 mg/dL Final  . HDL 14/78/2956 44  >39 mg/dL Final   Comment: According to ATP-III Guidelines, HDL-C >59 mg/dL is considered a                          negative  risk factor for CHD.  Marland Kitchen VLDL Cholesterol Cal 07/31/2013 56*  5 - 40 mg/dL Final  . LDL Calculated 07/31/2013 454* 0 - 99 mg/dL Final  . Chol/HDL Ratio 07/31/2013 5.3* 0.0 - 5.0 ratio units Final   Comment:                                   T. Chol/HDL Ratio                                                                      Men  Women                                                        1/2 Avg.Risk  3.4    3.3                                                            Avg.Risk  5.0    4.4                                                         2X Avg.Risk  9.6    7.1                                                         3X Avg.Risk 23.4   11.0  . TSH 07/31/2013 2.130  0.450 - 4.500 uIU/mL Final  . Creatinine, Ur 07/31/2013 101.7  22.0 - 328.0 mg/dL Final  . Microalbum.,U,Random 07/31/2013 <3.0  0.0 - 17.0 ug/mL Final   **Verified by repeat analysis**  . MICROALB/CREAT RATIO 07/31/2013 <2.9  0.0 - 30.0 mg/g creat Final         Assessment & Plan:  1. Hypertension Controlled. He wants to change from Bisoprolol to another medication due to cost increase. Will change to atenolol 50 mg qd.  2. Diabetes mellitus without complication controlled  3. Lumbago worse  4. Obesity, unspecified Continues to be a problem.   5. Other and unspecified hyperlipidemia Moderately high  6. Unspecified hypothyroidism Compensated  7. Urolithiasis Using potassium citrate to prevent stones

## 2013-08-06 NOTE — Patient Instructions (Signed)
Switch bisoprolol to atenolol. Continue other medications.

## 2013-10-14 ENCOUNTER — Other Ambulatory Visit: Payer: Self-pay | Admitting: Internal Medicine

## 2013-11-18 ENCOUNTER — Other Ambulatory Visit: Payer: Self-pay | Admitting: *Deleted

## 2013-11-18 MED ORDER — ONETOUCH LANCETS MISC: Status: DC

## 2013-11-18 MED ORDER — GLUCOSE BLOOD VI STRP: ORAL_STRIP | Status: DC

## 2013-11-18 NOTE — Telephone Encounter (Signed)
Patient called and stated that they are in [place] and needs test strips and lancets called into the pharmacy in [place] because he is out of them. Faxed Rx's into pharmacy

## 2014-01-05 DIAGNOSIS — M25579 Pain in unspecified ankle and joints of unspecified foot: Secondary | ICD-10-CM | POA: Insufficient documentation

## 2014-01-06 ENCOUNTER — Other Ambulatory Visit: Payer: Medicare Other

## 2014-01-06 DIAGNOSIS — E785 Hyperlipidemia, unspecified: Secondary | ICD-10-CM

## 2014-01-06 DIAGNOSIS — E119 Type 2 diabetes mellitus without complications: Secondary | ICD-10-CM

## 2014-01-07 LAB — HEMOGLOBIN A1C
Est. average glucose Bld gHb Est-mCnc: 128 mg/dL
Hgb A1c MFr Bld: 6.1 % — ABNORMAL HIGH (ref 4.8–5.6)

## 2014-01-07 LAB — LIPID PANEL
Chol/HDL Ratio: 5 ratio units (ref 0.0–5.0)
Cholesterol, Total: 217 mg/dL — ABNORMAL HIGH (ref 100–199)
HDL: 43 mg/dL (ref >39)
LDL CALC: 122 mg/dL — AB (ref 0–99)
Triglycerides: 261 mg/dL — ABNORMAL HIGH (ref 0–149)
VLDL Cholesterol Cal: 52 mg/dL — ABNORMAL HIGH (ref 5–40)

## 2014-01-07 LAB — BASIC METABOLIC PANEL
BUN/Creatinine Ratio: 21 (ref 10–22)
BUN: 19 mg/dL (ref 8–27)
CHLORIDE: 102 mmol/L (ref 97–108)
CO2: 21 mmol/L (ref 18–29)
Calcium: 9.8 mg/dL (ref 8.6–10.2)
Creatinine, Ser: 0.91 mg/dL (ref 0.76–1.27)
GFR, EST AFRICAN AMERICAN: 98 mL/min/{1.73_m2} (ref >59)
GFR, EST NON AFRICAN AMERICAN: 84 mL/min/{1.73_m2} (ref >59)
Glucose: 97 mg/dL (ref 65–99)
POTASSIUM: 5.3 mmol/L — AB (ref 3.5–5.2)
SODIUM: 142 mmol/L (ref 134–144)

## 2014-01-08 ENCOUNTER — Ambulatory Visit
Admission: RE | Admit: 2014-01-08 | Discharge: 2014-01-08 | Disposition: A | Discharge status: Home / Self Care | Payer: Medicare Other | Source: Ambulatory Visit | Attending: Internal Medicine | Admitting: Internal Medicine

## 2014-01-08 ENCOUNTER — Encounter: Payer: Self-pay | Admitting: Internal Medicine

## 2014-01-08 ENCOUNTER — Ambulatory Visit (INDEPENDENT_AMBULATORY_CARE_PROVIDER_SITE_OTHER): Payer: Medicare Other | Admitting: Internal Medicine

## 2014-01-08 VITALS — BP 132/80 | HR 66 | Temp 98.3°F | Resp 18 | Ht 65.35 in | Wt 230.4 lb

## 2014-01-08 DIAGNOSIS — I10 Essential (primary) hypertension: Secondary | ICD-10-CM

## 2014-01-08 DIAGNOSIS — M25579 Pain in unspecified ankle and joints of unspecified foot: Secondary | ICD-10-CM

## 2014-01-08 DIAGNOSIS — K219 Gastro-esophageal reflux disease without esophagitis: Secondary | ICD-10-CM

## 2014-01-08 DIAGNOSIS — E039 Hypothyroidism, unspecified: Secondary | ICD-10-CM

## 2014-01-08 DIAGNOSIS — E785 Hyperlipidemia, unspecified: Secondary | ICD-10-CM

## 2014-01-08 DIAGNOSIS — E119 Type 2 diabetes mellitus without complications: Secondary | ICD-10-CM

## 2014-01-08 DIAGNOSIS — E669 Obesity, unspecified: Secondary | ICD-10-CM

## 2014-01-08 DIAGNOSIS — M545 Low back pain, unspecified: Secondary | ICD-10-CM

## 2014-01-08 DIAGNOSIS — R609 Edema, unspecified: Secondary | ICD-10-CM | POA: Insufficient documentation

## 2014-01-08 MED ORDER — ASPIRIN 81 MG PO TABS: ORAL_TABLET | ORAL | Status: DC

## 2014-01-08 MED ORDER — FUROSEMIDE 20 MG PO TABS: ORAL_TABLET | ORAL | Status: DC

## 2014-01-08 MED ORDER — GLUCOSE BLOOD VI STRP: ORAL_STRIP | Status: DC

## 2014-01-08 NOTE — Progress Notes (Signed)
Patient ID: Nicholas Rivera, male   DOB: 02-05-1941, 73 y.o.   MRN: [id number]    Location:  [facility]   Place of Service: OFFICE    No Known Allergies  Chief Complaint  Patient presents with  . Follow-up    HPI:   Diabetes mellitus without complication: Well controlled  Hypertension: Controlled  Other and unspecified hyperlipidemia: Adequately controlled  Unspecified hypothyroidism: Recheck lab next visit  GERD (gastroesophageal reflux disease): Mild, intermittent, and generally in good control  Obesity, unspecified: Continues to struggle with weight loss. He does seem sincere in wanting to lose weight.  Lumbago: Chronic intermittent low back discomfort  Pain in joint, ankle and foot: Left foot swollen and uncomfortable. He does not recall any injuries, strains, or falls.    Medications: Patient's Medications  New Prescriptions   No medications on file  Previous Medications   ASPIRIN 325 MG TABLET    Take 325 mg by mouth daily. Take one tablet by mouth daily.   ATENOLOL (TENORMIN) 50 MG TABLET    One daily to control BP   GLUCOSE BLOOD TEST STRIP    Use in One Touch Meter to check blood sugar once daily. Dx 250.00   LEVOTHYROXINE (SYNTHROID, LEVOTHROID) 50 MCG TABLET    Take 1 tablet (50 mcg total) by mouth daily before breakfast. Take one tablet by mouth daily.   LOSARTAN (COZAAR) 100 MG TABLET    Take 1 tablet (100 mg total) by mouth daily. Take one tablet by mouth daily.   METFORMIN (GLUCOPHAGE) 500 MG TABLET    Take one tablet by mouth twice daily.   MULTIPLE VITAMINS-MINERALS (MULTIVITAMIN WITH MINERALS) TABLET    Take 1 tablet by mouth daily. Take one tablet by mouth daily.   MUPIROCIN OINTMENT (BACTROBAN) 2 %    Apply twice daily to affected area.   OMEPRAZOLE (PRILOSEC) 20 MG CAPSULE    Take one capsule by mouth daily to reduce stomach acid.   ONE TOUCH LANCETS MISC    Use to check blood sugar once daily Dx: 250.00   POTASSIUM CITRATE (UROCIT-K) 10 MEQ (1080 MG) SR  TABLET    TAKE 2 TABLETS BY MOUTH TWICE DAILY   VITAMIN C (ASCORBIC ACID) 500 MG TABLET    Take 500 mg by mouth daily. Take two tablets by mouth daily.  Modified Medications   No medications on file  Discontinued Medications   No medications on file     Review of Systems  Constitutional:       Obese  HENT: Positive for hearing loss.   Eyes: Negative.   Respiratory: Negative.   Cardiovascular: Negative for chest pain, palpitations and leg swelling.  Gastrointestinal: Negative for abdominal pain and abdominal distention.  Endocrine: Negative for cold intolerance, heat intolerance, polydipsia, polyphagia and polyuria.       Hx DM and hypothyroidism.  Genitourinary:       Hx Ca left tkidney and removal surgically. Hx BPH. Hx elevated PSA.  Musculoskeletal: Positive for back pain.       Swollen left foot that is mildly painful when he is walking.  Skin: Negative.   Neurological: Negative.   Hematological: Negative.   Psychiatric/Behavioral: Negative.     Filed Vitals:   01/08/14 1124  BP: 132/80  Pulse: 66  Temp: 98.3 F (36.8 C)  TempSrc: Oral  Resp: 18  Height: 5' 5.35" (1.66 m)  Weight: 230 lb 6.4 oz (104.509 kg)  SpO2: 97%   Physical Exam  Constitutional: He is  oriented to person, place, and time. No distress.  Obese.  HENT:  Head: Normocephalic.  Right Ear: External ear normal.  Left Ear: External ear normal.  Eyes: Conjunctivae and EOM are normal. Pupils are equal, round, and reactive to light.  Corrective lenses.  Neck: Normal range of motion. Neck supple. No JVD present. No tracheal deviation present. No thyromegaly present.  Cardiovascular: Normal rate, regular rhythm, normal heart sounds and intact distal pulses.  Exam reveals no gallop and no friction rub.   No murmur heard. Pulmonary/Chest: Effort normal and breath sounds normal. No respiratory distress. He has no wheezes. He has no rales. He exhibits no tenderness.  Abdominal: Soft. Bowel sounds are  normal. He exhibits no distension and no mass. There is no tenderness.  Musculoskeletal: Normal range of motion. He exhibits edema and tenderness.  Left ankle acutely inflamed.  Lymphadenopathy:    He has no cervical adenopathy.  Neurological: He is alert and oriented to person, place, and time. No cranial nerve deficit. Coordination normal.  Skin: No rash noted. There is erythema.  Psychiatric: He has a normal mood and affect. His behavior is normal. Judgment and thought content normal.     Labs reviewed: Appointment on 01/06/2014  Component Date Value Ref Range Status  . Hemoglobin A1C 01/06/2014 6.1* 4.8 - 5.6 % Final   Comment:          Increased risk for diabetes: 5.7 - 6.4                                   Diabetes: >6.4                                   Glycemic control for adults with diabetes: <7.0  . Estimated average glucose 01/06/2014 128   Final  . Glucose 01/06/2014 97  65 - 99 mg/dL Final  . BUN 01/06/2014 19  8 - 27 mg/dL Final  . Creatinine, Ser 01/06/2014 0.91  0.76 - 1.27 mg/dL Final  . GFR calc non Af Amer 01/06/2014 84  >59 mL/min/1.73 Final  . GFR calc Af Amer 01/06/2014 98  >59 mL/min/1.73 Final  . BUN/Creatinine Ratio 01/06/2014 21  10 - 22 Final  . Sodium 01/06/2014 142  134 - 144 mmol/L Final  . Potassium 01/06/2014 5.3* 3.5 - 5.2 mmol/L Final  . Chloride 01/06/2014 102  97 - 108 mmol/L Final  . CO2 01/06/2014 21  18 - 29 mmol/L Final  . Calcium 01/06/2014 9.8  8.6 - 10.2 mg/dL Final  . Cholesterol, Total 01/06/2014 217* 100 - 199 mg/dL Final  . Triglycerides 01/06/2014 261* 0 - 149 mg/dL Final  . HDL 01/06/2014 43  >39 mg/dL Final   Comment: According to ATP-III Guidelines, HDL-C >59 mg/dL is considered a                          negative risk factor for CHD.  Marland Kitchen VLDL Cholesterol Cal 01/06/2014 52* 5 - 40 mg/dL Final  . LDL Calculated 01/06/2014 122* 0 - 99 mg/dL Final  . Chol/HDL Ratio 01/06/2014 5.0  0.0 - 5.0 ratio units Final   Comment:  T. Chol/HDL Ratio                                                                      Men  Women                                                        1/2 Avg.Risk  3.4    3.3                                                            Avg.Risk  5.0    4.4                                                         2X Avg.Risk  9.6    7.1                                                         3X Avg.Risk 23.4   11.0      Assessment/Plan  1. Diabetes mellitus without complication: Continue current medication - glucose blood test strip; Use in One Touch Meter to check blood sugar once daily. Dx 250.00  Dispense: 50 each; Refill: 11  2. Hypertension: Controlled - aspirin 81 MG tablet; One daily to revent heart attack or stroke  Dispense: 30 tablet; Refill: 5  3. Other and unspecified hyperlipidemia Lose weight  4. Unspecified hypothyroidism Compensated. Repeat lab next visit.  5. GERD (gastroesophageal reflux disease) asymptomatic  6. Obesity, unspecified Focus on weight loss  7. Lumbago Fluctuating pain  8. Pain in joint, ankle and foot Acute. Should have x-rays and rule out gout. - Uric Acid - DG Ankle Complete Left; Future  9. Edema worse - furosemide (LASIX) 20 MG tablet; One each morning to help control edema.  Dispense: 30 tablet; Refill: 3

## 2014-01-08 NOTE — Patient Instructions (Signed)
Use medications as indicated.  Take Aleve with meals 3 times daily.

## 2014-01-09 ENCOUNTER — Encounter: Payer: Self-pay | Admitting: *Deleted

## 2014-01-09 LAB — SPECIMEN STATUS REPORT

## 2014-01-10 LAB — URIC ACID: Uric Acid: 6.6 mg/dL (ref 3.7–8.6)

## 2014-01-10 LAB — SPECIMEN STATUS REPORT

## 2014-01-24 ENCOUNTER — Ambulatory Visit (HOSPITAL_COMMUNITY)
Admission: RE | Admit: 2014-01-24 | Discharge: 2014-01-24 | Disposition: A | Discharge status: Home / Self Care | Payer: Medicare Other | Source: Ambulatory Visit | Attending: Urology | Admitting: Urology

## 2014-01-24 ENCOUNTER — Other Ambulatory Visit (HOSPITAL_COMMUNITY): Payer: Self-pay | Admitting: Urology

## 2014-01-24 DIAGNOSIS — C649 Malignant neoplasm of unspecified kidney, except renal pelvis: Secondary | ICD-10-CM | POA: Insufficient documentation

## 2014-01-24 DIAGNOSIS — J438 Other emphysema: Secondary | ICD-10-CM | POA: Insufficient documentation

## 2014-01-24 DIAGNOSIS — IMO0002 Reserved for concepts with insufficient information to code with codable children: Secondary | ICD-10-CM | POA: Insufficient documentation

## 2014-02-24 ENCOUNTER — Other Ambulatory Visit: Payer: Self-pay | Admitting: Internal Medicine

## 2014-03-19 ENCOUNTER — Ambulatory Visit (INDEPENDENT_AMBULATORY_CARE_PROVIDER_SITE_OTHER): Payer: Medicare Other | Admitting: Internal Medicine

## 2014-03-19 ENCOUNTER — Encounter: Payer: Self-pay | Admitting: Internal Medicine

## 2014-03-19 VITALS — BP 130/82 | HR 68 | Temp 98.3°F | Resp 10 | Wt 233.0 lb

## 2014-03-19 DIAGNOSIS — R609 Edema, unspecified: Secondary | ICD-10-CM

## 2014-03-19 DIAGNOSIS — K219 Gastro-esophageal reflux disease without esophagitis: Secondary | ICD-10-CM

## 2014-03-19 DIAGNOSIS — I10 Essential (primary) hypertension: Secondary | ICD-10-CM

## 2014-03-19 DIAGNOSIS — E039 Hypothyroidism, unspecified: Secondary | ICD-10-CM

## 2014-03-19 DIAGNOSIS — R059 Cough, unspecified: Secondary | ICD-10-CM

## 2014-03-19 DIAGNOSIS — E119 Type 2 diabetes mellitus without complications: Secondary | ICD-10-CM

## 2014-03-19 DIAGNOSIS — R05 Cough: Secondary | ICD-10-CM | POA: Insufficient documentation

## 2014-03-19 MED ORDER — METFORMIN HCL 500 MG PO TABS: ORAL_TABLET | ORAL | Status: DC

## 2014-03-19 MED ORDER — OMEPRAZOLE 20 MG PO CPDR
DELAYED_RELEASE_CAPSULE | ORAL | Status: DC
Start: 2014-03-19 — End: 2014-12-10

## 2014-03-19 MED ORDER — LEVOTHYROXINE SODIUM 50 MCG PO TABS: ORAL_TABLET | ORAL | Status: DC

## 2014-03-19 MED ORDER — LOSARTAN POTASSIUM 100 MG PO TABS: ORAL_TABLET | ORAL | Status: DC

## 2014-03-19 MED ORDER — AZITHROMYCIN 250 MG PO TABS: ORAL_TABLET | ORAL | Status: DC

## 2014-03-19 NOTE — Progress Notes (Signed)
Patient ID: Nicholas Rivera, male   DOB: 1941-03-25, 73 y.o.   MRN: [id number]    Location:    [facility]  Place of Service:  OFFICE    No Known Allergies  Chief Complaint  Patient presents with  . URI    Cough x 3 weeks     HPI:  Acute respiratory illness that started about 3 weeks ago. Cough produces yellow phlegm. Using Robitussin DM. Denies fever, tearing, full ears, hemoptysis. No nausea.  Concerned about swollen ankles that seemed to occur when switched from [place] due to cost considerations.  Medications: Patient's Medications  New Prescriptions   No medications on file  Previous Medications   ASPIRIN 81 MG TABLET    One daily to revent heart attack or stroke   ATENOLOL (TENORMIN) 50 MG TABLET    One daily to control BP   FUROSEMIDE (LASIX) 20 MG TABLET    One each morning to help control edema.   GLUCOSE BLOOD TEST STRIP    Use in One Touch Meter to check blood sugar once daily. Dx 250.00   LEVOTHYROXINE (SYNTHROID, LEVOTHROID) 50 MCG TABLET    Take 1 tablet (50 mcg total) by mouth daily before breakfast. Take one tablet by mouth daily.   LOSARTAN (COZAAR) 100 MG TABLET    Take 1 tablet (100 mg total) by mouth daily. Take one tablet by mouth daily.   METFORMIN (GLUCOPHAGE) 500 MG TABLET    TAKE 1 TABLET BY MOUTH TWICE DAILY   MULTIPLE VITAMINS-MINERALS (MULTIVITAMIN WITH MINERALS) TABLET    Take 1 tablet by mouth daily. Take one tablet by mouth daily.   MUPIROCIN OINTMENT (BACTROBAN) 2 %    Apply twice daily to affected area.   OMEPRAZOLE (PRILOSEC) 20 MG CAPSULE    Take one capsule by mouth daily to reduce stomach acid.   ONE TOUCH LANCETS MISC    Use to check blood sugar once daily Dx: 250.00   VITAMIN C (ASCORBIC ACID) 500 MG TABLET    Take 500 mg by mouth daily. Take two tablets by mouth daily.  Modified Medications   No medications on file  Discontinued Medications   POTASSIUM CITRATE (UROCIT-K) 10 MEQ (1080 MG) SR TABLET    TAKE 2 TABLETS BY MOUTH TWICE DAILY     Review  of Systems  Constitutional:       Obese  HENT: Positive for hearing loss.   Eyes: Negative.   Respiratory: Negative.   Cardiovascular: Negative for chest pain, palpitations and leg swelling.  Gastrointestinal: Negative for abdominal pain and abdominal distention.  Endocrine: Negative for cold intolerance, heat intolerance, polydipsia, polyphagia and polyuria.       Hx DM and hypothyroidism.  Genitourinary:       Hx Ca left tkidney and removal surgically. Hx BPH. Hx elevated PSA.  Musculoskeletal: Positive for back pain.       Swollen left foot that is mildly painful when he is walking.  Skin: Negative.   Neurological: Negative.   Hematological: Negative.   Psychiatric/Behavioral: Negative.     Filed Vitals:   03/19/14 1155  BP: 130/82  Pulse: 68  Temp: 98.3 F (36.8 C)  TempSrc: Oral  Resp: 10  Weight: 233 lb (105.688 kg)  SpO2: 96%   Body mass index is 38.35 kg/(m^2).  Physical Exam  Constitutional: He is oriented to person, place, and time. No distress.  Obese.  HENT:  Head: Normocephalic.  Right Ear: External ear normal.  Left Ear: External ear normal.  Eyes: Conjunctivae and EOM are normal. Pupils are equal, round, and reactive to light.  Corrective lenses.  Neck: Normal range of motion. Neck supple. No JVD present. No tracheal deviation present. No thyromegaly present.  Cardiovascular: Normal rate, regular rhythm, normal heart sounds and intact distal pulses.  Exam reveals no gallop and no friction rub.   No murmur heard. Pulmonary/Chest: Effort normal and breath sounds normal. No respiratory distress. He has no wheezes. He has no rales. He exhibits no tenderness.  Abdominal: Soft. Bowel sounds are normal. He exhibits no distension and no mass. There is no tenderness.  Musculoskeletal: Normal range of motion. He exhibits edema and tenderness.  Left ankle acutely inflamed.  Lymphadenopathy:    He has no cervical adenopathy.  Neurological: He is alert and  oriented to person, place, and time. No cranial nerve deficit. Coordination normal.  Skin: No rash noted. There is erythema.  Psychiatric: He has a normal mood and affect. His behavior is normal. Judgment and thought content normal.     Labs reviewed: Lab on 01/06/2014  Component Date Value Ref Range Status  . Hemoglobin A1C 01/06/2014 6.1* 4.8 - 5.6 % Final   Comment:          Increased risk for diabetes: 5.7 - 6.4                                   Diabetes: >6.4                                   Glycemic control for adults with diabetes: <7.0  . Estimated average glucose 01/06/2014 128   Final  . Glucose 01/06/2014 97  65 - 99 mg/dL Final  . BUN 01/06/2014 19  8 - 27 mg/dL Final  . Creatinine, Ser 01/06/2014 0.91  0.76 - 1.27 mg/dL Final  . GFR calc non Af Amer 01/06/2014 84  >59 mL/min/1.73 Final  . GFR calc Af Amer 01/06/2014 98  >59 mL/min/1.73 Final  . BUN/Creatinine Ratio 01/06/2014 21  10 - 22 Final  . Sodium 01/06/2014 142  134 - 144 mmol/L Final  . Potassium 01/06/2014 5.3* 3.5 - 5.2 mmol/L Final  . Chloride 01/06/2014 102  97 - 108 mmol/L Final  . CO2 01/06/2014 21  18 - 29 mmol/L Final  . Calcium 01/06/2014 9.8  8.6 - 10.2 mg/dL Final  . Cholesterol, Total 01/06/2014 217* 100 - 199 mg/dL Final  . Triglycerides 01/06/2014 261* 0 - 149 mg/dL Final  . HDL 01/06/2014 43  >39 mg/dL Final   Comment: According to ATP-III Guidelines, HDL-C >59 mg/dL is considered a                          negative risk factor for CHD.  Marland Kitchen VLDL Cholesterol Cal 01/06/2014 52* 5 - 40 mg/dL Final  . LDL Calculated 01/06/2014 122* 0 - 99 mg/dL Final  . Chol/HDL Ratio 01/06/2014 5.0  0.0 - 5.0 ratio units Final   Comment:                                   T. Chol/HDL Ratio  Men  Women                                                        1/2 Avg.Risk  3.4    3.3                                                            Avg.Risk   5.0    4.4                                                         2X Avg.Risk  9.6    7.1                                                         3X Avg.Risk 23.4   11.0  . specimen status report 01/06/2014 Comment   Final   Comment: Verbal Order                          See below:                          Comment:                          The [place] Code of [employer] requires a written and                          signed request be forwarded to a laboratory following a verbal order                          of a laboratory test.  Please assist [place] to meet this requirement and                          to complete our records.                          Date:______________________________                          ICD-9/Diagnosis Code(s):______________________________________________                          Physician or Authorized Designee:_____________________________________  Please Print                          Physician or Authorized Designee Signature:                          ______________________________________________________________________                          Your Signature Confirms Your Order Of The Test(s) Listed                          Please provide requested information and fax to [phone].                          Additional Test(s) Requested                          Comment:                          Test(s) added per Imelda B at account 01-09-2014                          Logged by Marlena Clipper                          Test# [id number] Uric Acid, Serum                          Diagnosis Codes Provided                             250.00     272.4  . Uric Acid 01/06/2014 6.6  3.7 - 8.6 mg/dL Final              Therapeutic target for gout patients: <6.0  . specimen status report 01/06/2014 Comment   Final   Comment: Written Authorization                          Written  Authorization                          Written Authorization Received.                          Authorization received from [facility] 01-09-2014                          Logged by Maralyn Sago      Assessment/Plan  1. Cough continueRobitussin DM or Delsym - azithromycin (ZITHROMAX) 250 MG tablet; 2 initially, then 1 daily for infection  Dispense: 6 tablet; Refill: 0  2. Essential hypertension Continue ATENOLOL. I will change this if he decides to change in the future. - losartan (COZAAR) 100 MG tablet; One daly to control BP  Dispense: 90 tablet; Refill: 3  3. Diabetes mellitus without complication - metFORMIN (GLUCOPHAGE) 500 MG tablet; One twice daily to control diabetes  Dispense: 180 tablet; Refill: 3  4. Unspecified hypothyroidism - levothyroxine (SYNTHROID, LEVOTHROID) 50 MCG tablet; Take one daily  for thyroid supplement.  Dispense: 90 tablet; Refill: 3  5. Gastroesophageal reflux disease without esophagitis - omeprazole (PRILOSEC) 20 MG capsule; Take one capsule by mouth daily to reduce stomach acid.  Dispense: 90 capsule; Refill: 4  6. Edema Not sure if this is related to atenolol.

## 2014-03-20 ENCOUNTER — Other Ambulatory Visit: Payer: Self-pay | Admitting: *Deleted

## 2014-03-20 DIAGNOSIS — R059 Cough, unspecified: Secondary | ICD-10-CM

## 2014-03-20 DIAGNOSIS — R05 Cough: Secondary | ICD-10-CM

## 2014-03-20 MED ORDER — AZITHROMYCIN 250 MG PO TABS: ORAL_TABLET | ORAL | Status: DC

## 2014-04-30 ENCOUNTER — Encounter: Payer: Self-pay | Admitting: Internal Medicine

## 2014-04-30 ENCOUNTER — Ambulatory Visit (INDEPENDENT_AMBULATORY_CARE_PROVIDER_SITE_OTHER): Payer: Medicare Other | Admitting: Internal Medicine

## 2014-04-30 VITALS — BP 152/86 | HR 73 | Temp 97.9°F | Wt 240.8 lb

## 2014-04-30 DIAGNOSIS — J45901 Unspecified asthma with (acute) exacerbation: Secondary | ICD-10-CM

## 2014-04-30 DIAGNOSIS — R05 Cough: Secondary | ICD-10-CM

## 2014-04-30 DIAGNOSIS — I10 Essential (primary) hypertension: Secondary | ICD-10-CM

## 2014-04-30 DIAGNOSIS — K219 Gastro-esophageal reflux disease without esophagitis: Secondary | ICD-10-CM

## 2014-04-30 DIAGNOSIS — R059 Cough, unspecified: Secondary | ICD-10-CM

## 2014-04-30 MED ORDER — BISOPROLOL-HYDROCHLOROTHIAZIDE 5-6.25 MG PO TABS: 1.0000 | ORAL_TABLET | Freq: Every day | ORAL | Status: DC

## 2014-04-30 MED ORDER — BUDESONIDE-FORMOTEROL FUMARATE 160-4.5 MCG/ACT IN AERO: INHALATION_SPRAY | RESPIRATORY_TRACT | Status: DC

## 2014-04-30 MED ORDER — MONTELUKAST SODIUM 10 MG PO TABS: ORAL_TABLET | ORAL | Status: DC

## 2014-04-30 NOTE — Patient Instructions (Signed)
Stop atenolol. Start Ziac. Continue losartan.

## 2014-04-30 NOTE — Progress Notes (Signed)
Patient ID: Nicholas Rivera, male   DOB: [birth year], 73 y.o.   MRN: [id number]    Location:    [facility]  Place of Service:  OFFICE    No Known Allergies  Chief Complaint  Patient presents with  . Acute Visit    cough x 3 months and SOB , has taken antibotics 3 weeks ago with little clearance & has been taking OTC Mucinex, cough drops, and cough syrup with no relief.  Marland Kitchen other    neg fall screening    HPI:  SOB, cough, sputum (clear to white) , wheeze. No fever. Only got 2 hours sleep last night. Using cough drops and [employer] cough medication. No sinus congestion. Denies watery eyes. No fever.  Feet more swollen. Wants to return to [place]. When he switched to atenolol, his feet began to swell.  Bp has been high.   Medications: Patient's Medications  New Prescriptions   No medications on file  Previous Medications   ASPIRIN 81 MG TABLET    One daily to revent heart attack or stroke   ATENOLOL (TENORMIN) 50 MG TABLET    One daily to control BP   FUROSEMIDE (LASIX) 20 MG TABLET    One each morning to help control edema.   GLUCOSE BLOOD TEST STRIP    Use in One Touch Meter to check blood sugar once daily. Dx 250.00   LEVOTHYROXINE (SYNTHROID, LEVOTHROID) 50 MCG TABLET    Take one daily for thyroid supplement.   LOSARTAN (COZAAR) 100 MG TABLET    One daly to control BP   METFORMIN (GLUCOPHAGE) 500 MG TABLET    One twice daily to control diabetes   MULTIPLE VITAMINS-MINERALS (MULTIVITAMIN WITH MINERALS) TABLET    Take 1 tablet by mouth daily. Take one tablet by mouth daily.   NAPROXEN SODIUM (ANAPROX) 220 MG TABLET    Take 220 mg by mouth 2 (two) times daily with a meal.   OMEPRAZOLE (PRILOSEC) 20 MG CAPSULE    Take one capsule by mouth daily to reduce stomach acid.   ONE TOUCH LANCETS MISC    Use to check blood sugar once daily Dx: 250.00   VITAMIN C (ASCORBIC ACID) 500 MG TABLET    Take 500 mg by mouth daily. Take two tablets by mouth daily.  Modified Medications   No medications on file    Discontinued Medications   AZITHROMYCIN (ZITHROMAX) 250 MG TABLET    2 initially, then 1 daily for infection   MUPIROCIN OINTMENT (BACTROBAN) 2 %    Apply twice daily to affected area.     Review of Systems  Constitutional:       Obese  HENT: Positive for hearing loss.   Eyes: Negative.   Respiratory: Negative.   Cardiovascular: Negative for chest pain, palpitations and leg swelling.  Gastrointestinal: Negative for abdominal pain and abdominal distention.  Endocrine: Negative for cold intolerance, heat intolerance, polydipsia, polyphagia and polyuria.       Hx DM and hypothyroidism.  Genitourinary:       Hx Ca left tkidney and removal surgically. Hx BPH. Hx elevated PSA.  Musculoskeletal: Positive for back pain.       Swollen left foot that is mildly painful when he is walking.  Skin: Negative.   Neurological: Negative.   Hematological: Negative.   Psychiatric/Behavioral: Negative.     Filed Vitals:   04/30/14 1558  BP: 152/86  Pulse: 73  Temp: 97.9 F (36.6 C)  TempSrc: Oral  Weight: 240 lb 12.8  oz (109.226 kg)  SpO2: 96%   Body mass index is 39.64 kg/(m^2).  Physical Exam  Constitutional: He is oriented to person, place, and time. No distress.  Obese.  HENT:  Head: Normocephalic.  Right Ear: External ear normal.  Left Ear: External ear normal.  Eyes: Conjunctivae and EOM are normal. Pupils are equal, round, and reactive to light.  Corrective lenses.  Neck: Normal range of motion. Neck supple. No JVD present. No tracheal deviation present. No thyromegaly present.  Cardiovascular: Normal rate, regular rhythm, normal heart sounds and intact distal pulses.  Exam reveals no gallop and no friction rub.   No murmur heard. Pulmonary/Chest: Effort normal and breath sounds normal. No respiratory distress. He has no wheezes. He has no rales. He exhibits no tenderness.  Abdominal: Soft. Bowel sounds are normal. He exhibits no distension and no mass. There is no tenderness.   Musculoskeletal: Normal range of motion. He exhibits edema and tenderness.  Left ankle acutely inflamed.  Lymphadenopathy:    He has no cervical adenopathy.  Neurological: He is alert and oriented to person, place, and time. No cranial nerve deficit. Coordination normal.  Skin: No rash noted. There is erythema.  Psychiatric: He has a normal mood and affect. His behavior is normal. Judgment and thought content normal.     Labs reviewed: No visits with results within 3 Month(s) from this visit. Latest known visit with results is:  Lab on 01/06/2014  Component Date Value Ref Range Status  . Hemoglobin A1C 01/06/2014 6.1* 4.8 - 5.6 % Final   Comment:          Increased risk for diabetes: 5.7 - 6.4                                   Diabetes: >6.4                                   Glycemic control for adults with diabetes: <7.0  . Estimated average glucose 01/06/2014 128   Final  . Glucose 01/06/2014 97  65 - 99 mg/dL Final  . BUN 01/06/2014 19  8 - 27 mg/dL Final  . Creatinine, Ser 01/06/2014 0.91  0.76 - 1.27 mg/dL Final  . GFR calc non Af Amer 01/06/2014 84  >59 mL/min/1.73 Final  . GFR calc Af Amer 01/06/2014 98  >59 mL/min/1.73 Final  . BUN/Creatinine Ratio 01/06/2014 21  10 - 22 Final  . Sodium 01/06/2014 142  134 - 144 mmol/L Final  . Potassium 01/06/2014 5.3* 3.5 - 5.2 mmol/L Final  . Chloride 01/06/2014 102  97 - 108 mmol/L Final  . CO2 01/06/2014 21  18 - 29 mmol/L Final  . Calcium 01/06/2014 9.8  8.6 - 10.2 mg/dL Final  . Cholesterol, Total 01/06/2014 217* 100 - 199 mg/dL Final  . Triglycerides 01/06/2014 261* 0 - 149 mg/dL Final  . HDL 01/06/2014 43  >39 mg/dL Final   Comment: According to ATP-III Guidelines, HDL-C >59 mg/dL is considered a                          negative risk factor for CHD.  Marland Kitchen VLDL Cholesterol Cal 01/06/2014 52* 5 - 40 mg/dL Final  . LDL Calculated 01/06/2014 122* 0 - 99 mg/dL Final  . Chol/HDL Ratio 01/06/2014 5.0  0.0 -  5.0 ratio units Final    Comment:                                   T. Chol/HDL Ratio                                                                      Men  Women                                                        1/2 Avg.Risk  3.4    3.3                                                            Avg.Risk  5.0    4.4                                                         2X Avg.Risk  9.6    7.1                                                         3X Avg.Risk 23.4   11.0  . specimen status report 01/06/2014 Comment   Final   Comment: Verbal Order                          See below:                          Comment:                          The [place] Code of [employer] requires a written and                          signed request be forwarded to a laboratory following a verbal order                          of a laboratory test.  Please assist [place] to meet this requirement and                          to complete our records.  Date:______________________________                          ICD-9/Diagnosis Code(s):______________________________________________                          Physician or Authorized Designee:_____________________________________                                                                        Please Print                          Physician or Authorized Designee Signature:                          ______________________________________________________________________                          Your Signature Confirms Your Order Of The Test(s) Listed                          Please provide requested information and fax to [phone].                          Additional Test(s) Requested                          Comment:                          Test(s) added per Imelda B at account 01-09-2014                          Logged by Marlena Clipper                          Test# [id number] Uric Acid, Serum                          Diagnosis Codes  Provided                             250.00     272.4  . Uric Acid 01/06/2014 6.6  3.7 - 8.6 mg/dL Final              Therapeutic target for gout patients: <6.0  . specimen status report 01/06/2014 Comment   Final   Comment: Written Authorization                          Written Authorization                          Written Authorization Received.                          Authorization received from [place] 01-09-2014  Logged by Maralyn Sago    Assessment/Plan  1. Cough - budesonide-formoterol (SYMBICORT) 160-4.5 MCG/ACT inhaler; 2 puffs inhaled twice daily to help breathing  Dispense: 1 Inhaler; Refill: 3  2. Essential hypertension Elevated SBP Resume [place]  3. Gastroesophageal reflux disease without esophagitis stable  4. Asthma with acute exacerbation, unspecified asthma severity - budesonide-formoterol (SYMBICORT) 160-4.5 MCG/ACT inhaler; 2 puffs inhaled twice daily to help breathing  Dispense: 1 Inhaler; Refill: 3

## 2014-05-13 ENCOUNTER — Other Ambulatory Visit: Payer: Self-pay | Admitting: *Deleted

## 2014-05-13 DIAGNOSIS — R609 Edema, unspecified: Secondary | ICD-10-CM

## 2014-05-13 MED ORDER — FUROSEMIDE 20 MG PO TABS: ORAL_TABLET | ORAL | Status: DC

## 2014-05-19 ENCOUNTER — Other Ambulatory Visit: Payer: Medicare Other

## 2014-05-19 DIAGNOSIS — E119 Type 2 diabetes mellitus without complications: Secondary | ICD-10-CM

## 2014-05-19 DIAGNOSIS — E785 Hyperlipidemia, unspecified: Secondary | ICD-10-CM

## 2014-05-20 LAB — COMPREHENSIVE METABOLIC PANEL
ALBUMIN: 4.3 g/dL (ref 3.5–4.8)
ALT: 24 IU/L (ref 0–44)
AST: 15 IU/L (ref 0–40)
Albumin/Globulin Ratio: 1.9 (ref 1.1–2.5)
Alkaline Phosphatase: 98 IU/L (ref 39–117)
BUN/Creatinine Ratio: 18 (ref 10–22)
BUN: 18 mg/dL (ref 8–27)
CO2: 26 mmol/L (ref 18–29)
CREATININE: 1.02 mg/dL (ref 0.76–1.27)
Calcium: 9.9 mg/dL (ref 8.6–10.2)
Chloride: 97 mmol/L (ref 97–108)
GFR calc non Af Amer: 74 mL/min/{1.73_m2} (ref >59)
GFR, EST AFRICAN AMERICAN: 85 mL/min/{1.73_m2} (ref >59)
GLUCOSE: 104 mg/dL — AB (ref 65–99)
Globulin, Total: 2.3 g/dL (ref 1.5–4.5)
Potassium: 4.4 mmol/L (ref 3.5–5.2)
Sodium: 140 mmol/L (ref 134–144)
TOTAL PROTEIN: 6.6 g/dL (ref 6.0–8.5)
Total Bilirubin: 0.3 mg/dL (ref 0.0–1.2)

## 2014-05-20 LAB — HEMOGLOBIN A1C
Est. average glucose Bld gHb Est-mCnc: 143 mg/dL
HEMOGLOBIN A1C: 6.6 % — AB (ref 4.8–5.6)

## 2014-05-20 LAB — MICROALBUMIN / CREATININE URINE RATIO
Creatinine, Ur: 21.4 mg/dL — ABNORMAL LOW (ref 22.0–328.0)
MICROALB/CREAT RATIO: <14 mg/g creat (ref 0.0–30.0)

## 2014-05-20 LAB — LIPID PANEL
CHOLESTEROL TOTAL: 248 mg/dL — AB (ref 100–199)
Chol/HDL Ratio: 5.4 ratio units — ABNORMAL HIGH (ref 0.0–5.0)
HDL: 46 mg/dL (ref >39)
LDL CALC: 142 mg/dL — AB (ref 0–99)
Triglycerides: 298 mg/dL — ABNORMAL HIGH (ref 0–149)
VLDL CHOLESTEROL CAL: 60 mg/dL — AB (ref 5–40)

## 2014-05-21 ENCOUNTER — Ambulatory Visit: Payer: Medicare Other | Admitting: Internal Medicine

## 2014-06-04 ENCOUNTER — Encounter: Payer: Self-pay | Admitting: Internal Medicine

## 2014-06-04 ENCOUNTER — Ambulatory Visit (INDEPENDENT_AMBULATORY_CARE_PROVIDER_SITE_OTHER): Payer: Medicare Other | Admitting: Internal Medicine

## 2014-06-04 DIAGNOSIS — M545 Low back pain, unspecified: Secondary | ICD-10-CM | POA: Diagnosis not present

## 2014-06-04 DIAGNOSIS — E785 Hyperlipidemia, unspecified: Secondary | ICD-10-CM | POA: Diagnosis not present

## 2014-06-04 DIAGNOSIS — E669 Obesity, unspecified: Secondary | ICD-10-CM

## 2014-06-04 DIAGNOSIS — Z23 Encounter for immunization: Secondary | ICD-10-CM

## 2014-06-04 DIAGNOSIS — R972 Elevated prostate specific antigen [PSA]: Secondary | ICD-10-CM | POA: Diagnosis not present

## 2014-06-04 DIAGNOSIS — E119 Type 2 diabetes mellitus without complications: Secondary | ICD-10-CM

## 2014-06-04 DIAGNOSIS — R059 Cough, unspecified: Secondary | ICD-10-CM | POA: Diagnosis not present

## 2014-06-04 DIAGNOSIS — E039 Hypothyroidism, unspecified: Secondary | ICD-10-CM

## 2014-06-04 DIAGNOSIS — I10 Essential (primary) hypertension: Secondary | ICD-10-CM | POA: Diagnosis not present

## 2014-06-04 DIAGNOSIS — K219 Gastro-esophageal reflux disease without esophagitis: Secondary | ICD-10-CM | POA: Diagnosis not present

## 2014-06-04 DIAGNOSIS — C649 Malignant neoplasm of unspecified kidney, except renal pelvis: Secondary | ICD-10-CM | POA: Diagnosis not present

## 2014-06-04 DIAGNOSIS — R05 Cough: Secondary | ICD-10-CM

## 2014-06-04 NOTE — Progress Notes (Signed)
Failed clock drawing  

## 2014-06-04 NOTE — Progress Notes (Signed)
Patient ID: Nicholas Rivera, male   DOB: Feb 17, 1941, 73 y.o.   MRN: [id number]    Location:    [facility]   Place of Service:   OFFICE  Extended Emergency Contact Information Primary Emergency Contact: Payton Doughty Address: [place] [address], [place] [address] [place] Phone: [phone] Mobile Phone: [phone] Relation: Daughter Secondary Emergency Contact: Orlie Pollen A Address: [address] [place],  [address] Home Phone: [phone] Relation: None   Chief Complaint  Patient presents with  . Annual Exam    Yearly check-up, discuss labs (copy printed)   . MMSE    30/30, Failed clock drawing     HPI:  Other and unspecified hyperlipidemia - LDL 142. Discussed  Potential need to medicate.  Type II or unspecified type diabetes mellitus without mention of complication, not stated as uncontrolled - controlled  Need for prophylactic vaccination against Streptococcus pneumoniae (pneumococcus) -   Essential hypertension: controlled  Midline low back pain without sciatica: stable  Obesity, unspecified: gaining weight  PSA elevation: follow in future  Unspecified hypothyroidism: compensated  Cancer of kidney, unspecified laterality: resolved  Gastroesophageal reflux disease without esophagitis: asymptomatic    Past Medical History  Diagnosis Date  . Diabetes mellitus without complication 3536  . Hypertension 2012  . Unspecified hypothyroidism 2012  . Nephrolithiasis 2012  . Cancer of kidney 2012  . Obesity, unspecified   . Cataract 2013  . Other and unspecified hyperlipidemia 04/03/2013  . Back pain, lumbosacral 2014  . Pain, joint, ankle and foot     Past Surgical History  Procedure Laterality Date  . Kidney surgery  12/2010    had cancer removed; Dr. Alinda Money  . Rotator cuff repair      bilateral; Dr. [name]    History   Social History  . Marital Status: Married    Spouse Name: N/A    Number of Children: N/A  . Years of  Education: N/A   Occupational History  . Retired    Social History Main Topics  . Smoking status: Never Smoker   . Smokeless tobacco: Not on file  . Alcohol Use: No  . Drug Use: No  . Sexual Activity: Yes   Other Topics Concern  . Not on file   Social History Narrative   Patient was previously married. His wife has died from cancer.     reports that he has never smoked. He does not have any smokeless tobacco history on file. He reports that he does not drink alcohol or use illicit drugs.  Immunization History  Administered Date(s) Administered  . Influenza-Unspecified 05/06/2012, 06/05/2013  . Pneumococcal Conjugate-13 06/04/2014  . Pneumococcal Polysaccharide-23 09/05/1998  . Td 09/06/2003    No Known Allergies  Medications: Patient's Medications  New Prescriptions   No medications on file  Previous Medications   ASPIRIN 81 MG TABLET    One daily to revent heart attack or stroke   BISOPROLOL-HYDROCHLOROTHIAZIDE (ZIAC) 5-6.25 MG PER TABLET    Take 1 tablet by mouth daily.   BUDESONIDE-FORMOTEROL (SYMBICORT) 160-4.5 MCG/ACT INHALER    2 puffs inhaled twice daily to help breathing   FUROSEMIDE (LASIX) 20 MG TABLET    One each morning to help control edema.   GLUCOSE BLOOD TEST STRIP    Use in One Touch Meter to check blood sugar once daily. Dx 250.00   LEVOTHYROXINE (SYNTHROID, LEVOTHROID) 50  MCG TABLET    Take one daily for thyroid supplement.   LOSARTAN (COZAAR) 100 MG TABLET    One daly to control BP   METFORMIN (GLUCOPHAGE) 500 MG TABLET    One twice daily to control diabetes   MONTELUKAST (SINGULAIR) 10 MG TABLET    One nightly to help breathing   MULTIPLE VITAMINS-MINERALS (MULTIVITAMIN WITH MINERALS) TABLET    Take 1 tablet by mouth daily. Take one tablet by mouth daily.   NAPROXEN SODIUM (ANAPROX) 220 MG TABLET    Take 220 mg by mouth 2 (two) times daily with a meal.   OMEPRAZOLE (PRILOSEC) 20 MG CAPSULE    Take one capsule by mouth daily to reduce stomach acid.     ONE TOUCH LANCETS MISC    Use to check blood sugar once daily Dx: 250.00   VITAMIN C (ASCORBIC ACID) 500 MG TABLET    Take 500 mg by mouth daily. Take two tablets by mouth daily.  Modified Medications   No medications on file  Discontinued Medications   No medications on file     Review of Systems  Constitutional:       Obese  HENT: Positive for hearing loss.   Eyes: Negative.   Respiratory: Negative.   Cardiovascular: Negative for chest pain, palpitations and leg swelling.  Gastrointestinal: Negative for abdominal pain and abdominal distention.  Endocrine: Negative for cold intolerance, heat intolerance, polydipsia, polyphagia and polyuria.       Hx DM and hypothyroidism.  Genitourinary:       Hx Ca left tkidney and removal surgically. Hx BPH. Hx elevated PSA.  Musculoskeletal: Positive for back pain.       ResolvedsSwollen left foot that was mildly painful when he was walking.  Skin: Negative.   Neurological: Negative.   Hematological: Negative.   Psychiatric/Behavioral: Negative.     Filed Vitals:   06/04/14 1332  BP: 122/80  Pulse: 86  Temp: 98.5 F (36.9 C)  TempSrc: Oral  Resp: 10  Height: 5' 5.5" (1.664 m)  Weight: 242 lb (109.77 kg)  SpO2: 95%   Body mass index is 39.64 kg/(m^2).  Physical Exam  Constitutional: He is oriented to person, place, and time. No distress.  Obese.  HENT:  Head: Normocephalic.  Right Ear: External ear normal.  Left Ear: External ear normal.  Eyes: Conjunctivae and EOM are normal. Pupils are equal, round, and reactive to light.  Corrective lenses.  Neck: Normal range of motion. Neck supple. No JVD present. No tracheal deviation present. No thyromegaly present.  Cardiovascular: Normal rate, regular rhythm, normal heart sounds and intact distal pulses.  Exam reveals no gallop and no friction rub.   No murmur heard. Pulmonary/Chest: Effort normal and breath sounds normal. No respiratory distress. He has no wheezes. He has no  rales. He exhibits no tenderness.  Abdominal: Soft. Bowel sounds are normal. He exhibits no distension and no mass. There is no tenderness.  Musculoskeletal: Normal range of motion. He exhibits edema and tenderness.  Lymphadenopathy:    He has no cervical adenopathy.  Neurological: He is alert and oriented to person, place, and time. No cranial nerve deficit. Coordination normal.  Skin: No rash noted. There is erythema.  Psychiatric: He has a normal mood and affect. His behavior is normal. Judgment and thought content normal.     Labs reviewed: Appointment on 05/19/2014  Component Date Value Ref Range Status  . Hemoglobin A1C 05/19/2014 6.6* 4.8 - 5.6 % Final   Comment:  Increased risk for diabetes: 5.7 - 6.4                                   Diabetes: >6.4                                   Glycemic control for adults with diabetes: <7.0  . Estimated average glucose 05/19/2014 143   Final  . Glucose 05/19/2014 104* 65 - 99 mg/dL Final  . BUN 05/19/2014 18  8 - 27 mg/dL Final  . Creatinine, Ser 05/19/2014 1.02  0.76 - 1.27 mg/dL Final  . GFR calc non Af Amer 05/19/2014 74  >59 mL/min/1.73 Final  . GFR calc Af Amer 05/19/2014 85  >59 mL/min/1.73 Final  . BUN/Creatinine Ratio 05/19/2014 18  10 - 22 Final  . Sodium 05/19/2014 140  134 - 144 mmol/L Final  . Potassium 05/19/2014 4.4  3.5 - 5.2 mmol/L Final  . Chloride 05/19/2014 97  97 - 108 mmol/L Final  . CO2 05/19/2014 26  18 - 29 mmol/L Final  . Calcium 05/19/2014 9.9  8.6 - 10.2 mg/dL Final  . Total Protein 05/19/2014 6.6  6.0 - 8.5 g/dL Final  . Albumin 05/19/2014 4.3  3.5 - 4.8 g/dL Final  . Globulin, Total 05/19/2014 2.3  1.5 - 4.5 g/dL Final  . Albumin/Globulin Ratio 05/19/2014 1.9  1.1 - 2.5 Final  . Total Bilirubin 05/19/2014 0.3  0.0 - 1.2 mg/dL Final  . Alkaline Phosphatase 05/19/2014 98  39 - 117 IU/L Final  . AST 05/19/2014 15  0 - 40 IU/L Final  . ALT 05/19/2014 24  0 - 44 IU/L Final  . Creatinine, Ur  05/19/2014 21.4* 22.0 - 328.0 mg/dL Final  . Microalbum.,U,Random 05/19/2014 <3.0  0.0 - 17.0 ug/mL Final   **Verified by repeat analysis**  . MICROALB/CREAT RATIO 05/19/2014 <14.0  0.0 - 30.0 mg/g creat Final  . Cholesterol, Total 05/19/2014 248* 100 - 199 mg/dL Final  . Triglycerides 05/19/2014 298* 0 - 149 mg/dL Final  . HDL 05/19/2014 46  >39 mg/dL Final   Comment: According to ATP-III Guidelines, HDL-C >59 mg/dL is considered a                          negative risk factor for CHD.  Marland Kitchen VLDL Cholesterol Cal 05/19/2014 60* 5 - 40 mg/dL Final  . LDL Calculated 05/19/2014 142* 0 - 99 mg/dL Final  . Chol/HDL Ratio 05/19/2014 5.4* 0.0 - 5.0 ratio units Final   Comment:                                   T. Chol/HDL Ratio                                                                      Men  Women  1/2 Avg.Risk  3.4    3.3                                                            Avg.Risk  5.0    4.4                                                         2X Avg.Risk  9.6    7.1                                                         3X Avg.Risk 23.4   11.0   06/04/14 EKG: rate 83. NSR. Normal  Assessment/Plan  1. Diabetes mellitus without complication - Hemoglobin A1c; Future - Basic metabolic panel; Future  2. Essential hypertension - EKG 12-Lead  3. Midline low back pain without sciatica stable  4. Obesity, unspecified Must lose weight. Discussed diet  5. PSA elevation Recheck in the future  6. Unspecified hypothyroidism compensated  7. Cancer of kidney, unspecified laterality No relapse  8. Gastroesophageal reflux disease without esophagitis asymptomatic  9. Cough mild and nonproductive. Treat with OTC cough suppressant.  10. Hyperlipidemia - Lipid panel; Future  11. Hypothyroidism - TSH; Future  12. Need for prophylactic vaccination against Streptococcus pneumoniae (pneumococcus) - Pneumococcal  conjugate vaccine 13-valent

## 2014-07-28 DIAGNOSIS — E785 Hyperlipidemia, unspecified: Secondary | ICD-10-CM | POA: Insufficient documentation

## 2014-07-28 DIAGNOSIS — E039 Hypothyroidism, unspecified: Secondary | ICD-10-CM | POA: Insufficient documentation

## 2014-08-19 ENCOUNTER — Other Ambulatory Visit: Payer: Self-pay | Admitting: Internal Medicine

## 2014-08-25 ENCOUNTER — Other Ambulatory Visit: Payer: Medicare Other

## 2014-08-25 DIAGNOSIS — E119 Type 2 diabetes mellitus without complications: Secondary | ICD-10-CM

## 2014-08-25 DIAGNOSIS — E039 Hypothyroidism, unspecified: Secondary | ICD-10-CM

## 2014-08-25 DIAGNOSIS — E785 Hyperlipidemia, unspecified: Secondary | ICD-10-CM

## 2014-08-26 LAB — BASIC METABOLIC PANEL
BUN/Creatinine Ratio: 17 (ref 10–22)
BUN: 17 mg/dL (ref 8–27)
CO2: 24 mmol/L (ref 18–29)
Calcium: 9.7 mg/dL (ref 8.6–10.2)
Chloride: 100 mmol/L (ref 97–108)
Creatinine, Ser: 0.99 mg/dL (ref 0.76–1.27)
GFR calc Af Amer: 88 mL/min/{1.73_m2} (ref >59)
GFR, EST NON AFRICAN AMERICAN: 76 mL/min/{1.73_m2} (ref >59)
Glucose: 110 mg/dL — ABNORMAL HIGH (ref 65–99)
Potassium: 4.4 mmol/L (ref 3.5–5.2)
SODIUM: 139 mmol/L (ref 134–144)

## 2014-08-26 LAB — LIPID PANEL
Chol/HDL Ratio: 5.8 ratio units — ABNORMAL HIGH (ref 0.0–5.0)
Cholesterol, Total: 227 mg/dL — ABNORMAL HIGH (ref 100–199)
HDL: 39 mg/dL — ABNORMAL LOW (ref >39)
LDL Calculated: 119 mg/dL — ABNORMAL HIGH (ref 0–99)
Triglycerides: 345 mg/dL — ABNORMAL HIGH (ref 0–149)
VLDL CHOLESTEROL CAL: 69 mg/dL — AB (ref 5–40)

## 2014-08-26 LAB — TSH: TSH: 2.77 u[IU]/mL (ref 0.450–4.500)

## 2014-08-26 LAB — HEMOGLOBIN A1C
Est. average glucose Bld gHb Est-mCnc: 140 mg/dL
Hgb A1c MFr Bld: 6.5 % — ABNORMAL HIGH (ref 4.8–5.6)

## 2014-08-27 ENCOUNTER — Ambulatory Visit (INDEPENDENT_AMBULATORY_CARE_PROVIDER_SITE_OTHER): Payer: Medicare Other | Admitting: Internal Medicine

## 2014-08-27 ENCOUNTER — Encounter: Payer: Self-pay | Admitting: Internal Medicine

## 2014-08-27 VITALS — BP 138/74 | HR 81 | Temp 98.6°F | Resp 20 | Ht 66.0 in | Wt 247.0 lb

## 2014-08-27 DIAGNOSIS — R682 Dry mouth, unspecified: Secondary | ICD-10-CM

## 2014-08-27 DIAGNOSIS — M545 Low back pain, unspecified: Secondary | ICD-10-CM

## 2014-08-27 DIAGNOSIS — E669 Obesity, unspecified: Secondary | ICD-10-CM

## 2014-08-27 DIAGNOSIS — R609 Edema, unspecified: Secondary | ICD-10-CM

## 2014-08-27 DIAGNOSIS — K219 Gastro-esophageal reflux disease without esophagitis: Secondary | ICD-10-CM

## 2014-08-27 DIAGNOSIS — C649 Malignant neoplasm of unspecified kidney, except renal pelvis: Secondary | ICD-10-CM

## 2014-08-27 DIAGNOSIS — J45909 Unspecified asthma, uncomplicated: Secondary | ICD-10-CM | POA: Insufficient documentation

## 2014-08-27 DIAGNOSIS — E785 Hyperlipidemia, unspecified: Secondary | ICD-10-CM

## 2014-08-27 DIAGNOSIS — I10 Essential (primary) hypertension: Secondary | ICD-10-CM

## 2014-08-27 DIAGNOSIS — E039 Hypothyroidism, unspecified: Secondary | ICD-10-CM

## 2014-08-27 DIAGNOSIS — J4520 Mild intermittent asthma, uncomplicated: Secondary | ICD-10-CM

## 2014-08-27 DIAGNOSIS — K117 Disturbances of salivary secretion: Secondary | ICD-10-CM | POA: Insufficient documentation

## 2014-08-27 DIAGNOSIS — E119 Type 2 diabetes mellitus without complications: Secondary | ICD-10-CM

## 2014-08-27 NOTE — Progress Notes (Signed)
Patient ID: Nicholas Rivera, male   DOB: 05/03/1941, 73 y.o.   MRN: [id number]    Facility  [facility]    Place of Service:   OFFICE   No Known Allergies  Chief Complaint  Patient presents with  . Medical Management of Chronic Issues    HPI:  Diabetes mellitus without complication: : controlled  Essential hypertension: : controlled  Hyperlipidemia: runs high HDL and triglycerides  Gastroesophageal reflux disease without esophagitis: improved  Hypothyroidism, unspecified hypothyroidism type: controlled  Midline low back pain without sciatica: stable to improved  Edema: 1-2 +. R>L  Cancer of kidney, unspecified laterality: no relapse. Followed  Asthma, chronic, mild intermittent, uncomplicated: no wheezing  Obesity: He is to be on the Atkins diet but has not used this for quite a while. He is gaining weight steadily. It appears he may be up 17 pounds since May 2015  Xerostomia: He is not aware whether he is a mouth breather at night. He asked whether his medicines could be giving him trouble. He is no longer using Symbicort but does use Spiriva. He is on diuretics.    Medications: Patient's Medications  New Prescriptions   No medications on file  Previous Medications   ASPIRIN 81 MG TABLET    One daily to revent heart attack or stroke   BISOPROLOL-HYDROCHLOROTHIAZIDE (ZIAC) 5-6.25 MG PER TABLET    TAKE 1 TABLET BY MOUTH EVERY DAY   BUDESONIDE-FORMOTEROL (SYMBICORT) 160-4.5 MCG/ACT INHALER    2 puffs inhaled twice daily to help breathing   FUROSEMIDE (LASIX) 20 MG TABLET    One each morning to help control edema.   GLUCOSE BLOOD TEST STRIP    Use in One Touch Meter to check blood sugar once daily. Dx 250.00   LEVOTHYROXINE (SYNTHROID, LEVOTHROID) 50 MCG TABLET    Take one daily for thyroid supplement.   LOSARTAN (COZAAR) 100 MG TABLET    One daly to control BP   METFORMIN (GLUCOPHAGE) 500 MG TABLET    One twice daily to control diabetes   MONTELUKAST (SINGULAIR) 10 MG TABLET     One nightly to help breathing   MULTIPLE VITAMINS-MINERALS (MULTIVITAMIN WITH MINERALS) TABLET    Take 1 tablet by mouth daily. Take one tablet by mouth daily.   NAPROXEN SODIUM (ANAPROX) 220 MG TABLET    Take 220 mg by mouth 2 (two) times daily with a meal.   OMEPRAZOLE (PRILOSEC) 20 MG CAPSULE    Take one capsule by mouth daily to reduce stomach acid.   ONE TOUCH LANCETS MISC    Use to check blood sugar once daily Dx: 250.00   VITAMIN C (ASCORBIC ACID) 500 MG TABLET    Take 500 mg by mouth daily. Take two tablets by mouth daily.  Modified Medications   No medications on file  Discontinued Medications   No medications on file     Review of Systems  Constitutional:       Obese  HENT: Positive for hearing loss.   Eyes: Negative.   Respiratory: Negative.   Cardiovascular: Negative for chest pain, palpitations and leg swelling.  Gastrointestinal: Negative for abdominal pain and abdominal distention.  Endocrine: Negative for cold intolerance, heat intolerance, polydipsia, polyphagia and polyuria.       Hx DM and hypothyroidism.  Genitourinary:       Hx Ca left tkidney and removal surgically. Hx BPH. Hx elevated PSA.  Musculoskeletal: Positive for back pain.       ResolvedsSwollen left foot that was mildly  painful when he was walking.  Skin: Negative.   Neurological: Negative.   Hematological: Negative.   Psychiatric/Behavioral: Negative.     Filed Vitals:   08/27/14 1256  BP: 138/74  Pulse: 81  Temp: 98.6 F (37 C)  TempSrc: Oral  Resp: 20  Height: 5\' 6"  (1.676 m)  Weight: 247  SpO2: 96%   Body mass index is 39.89 kg/(m^2).   Physical Exam  Constitutional: He is oriented to person, place, and time. No distress.  Obese.  HENT:  Head: Normocephalic.  Right Ear: External ear normal.  Left Ear: External ear normal.  Eyes: Conjunctivae and EOM are normal. Pupils are equal, round, and reactive to light.  Corrective lenses.  Neck: Normal range of motion. Neck supple. No  JVD present. No tracheal deviation present. No thyromegaly present.  Cardiovascular: Normal rate, regular rhythm, normal heart sounds and intact distal pulses.  Exam reveals no gallop and no friction rub.   No murmur heard. Pulmonary/Chest: Effort normal and breath sounds normal. No respiratory distress. He has no wheezes. He has no rales. He exhibits no tenderness.  Abdominal: Soft. Bowel sounds are normal. He exhibits no distension and no mass. There is no tenderness.  Musculoskeletal: Normal range of motion. He exhibits edema and tenderness.  Lymphadenopathy:    He has no cervical adenopathy.  Neurological: He is alert and oriented to person, place, and time. No cranial nerve deficit. Coordination normal.  Skin: No rash noted. There is erythema.  Psychiatric: He has a normal mood and affect. His behavior is normal. Judgment and thought content normal.     Labs reviewed: Lab on 08/25/2014  Component Date Value Ref Range Status  . Hgb A1c MFr Bld 08/25/2014 6.5* 4.8 - 5.6 % Final   Comment:          Pre-diabetes: 5.7 - 6.4          Diabetes: >6.4          Glycemic control for adults with diabetes: <7.0   . Est. average glucose Bld gHb Est-m* 08/25/2014 140   Final  . Glucose 08/25/2014 110* 65 - 99 mg/dL Final  . BUN 08/25/2014 17  8 - 27 mg/dL Final  . Creatinine, Ser 08/25/2014 0.99  0.76 - 1.27 mg/dL Final  . GFR calc non Af Amer 08/25/2014 76  >59 mL/min/1.73 Final  . GFR calc Af Amer 08/25/2014 88  >59 mL/min/1.73 Final  . BUN/Creatinine Ratio 08/25/2014 17  10 - 22 Final  . Sodium 08/25/2014 139  134 - 144 mmol/L Final  . Potassium 08/25/2014 4.4  3.5 - 5.2 mmol/L Final  . Chloride 08/25/2014 100  97 - 108 mmol/L Final  . CO2 08/25/2014 24  18 - 29 mmol/L Final  . Calcium 08/25/2014 9.7  8.6 - 10.2 mg/dL Final  . Cholesterol, Total 08/25/2014 227* 100 - 199 mg/dL Final  . Triglycerides 08/25/2014 345* 0 - 149 mg/dL Final  . HDL 08/25/2014 39* >39 mg/dL Final   Comment:  According to ATP-III Guidelines, HDL-C >59 mg/dL is considered a negative risk factor for CHD.   Marland Kitchen VLDL Cholesterol Cal 08/25/2014 69* 5 - 40 mg/dL Final  . LDL Calculated 08/25/2014 119* 0 - 99 mg/dL Final  . Chol/HDL Ratio 08/25/2014 5.8* 0.0 - 5.0 ratio units Final   Comment:  T. Chol/HDL Ratio                                             Men  Women                               1/2 Avg.Risk  3.4    3.3                                   Avg.Risk  5.0    4.4                                2X Avg.Risk  9.6    7.1                                3X Avg.Risk 23.4   11.0   . TSH 08/25/2014 2.770  0.450 - 4.500 uIU/mL Final     Assessment/Plan  1. Diabetes mellitus without complication Continue current medication Counseled on weight loss. He was given a 1000-calorie diet. He should avoid starches such as breads, pasta, and potatoes. - Hemoglobin A1c; Future - Basic metabolic panel; Future - Microalbumin, urine; Future  2. Essential hypertension Stable   3. Hyperlipidemia Patient needs to be compliant with diet, lose weight, and avoid cholesterol containing foods. - Lipid panel; Future  4. Gastroesophageal reflux disease without esophagitis Asymptomatic  5. Hypothyroidism, unspecified hypothyroidism type Compensated - TSH; Future  6. Midline low back pain without sciatica Significantly improved  7. Edema 1-2+ bilateral. Does not follow any salt restrictions. Recommended that he follow salt restricted diet. As well as calorically restricted.  8. Cancer of kidney, unspecified laterality No recurrence. See Dr. Alinda Money as scheduled on a regular basis.  9. Asthma, chronic, mild intermittent, uncomplicated Under control at this time. Discontinued Symbicort. He will remain on Spiriva.  10. Obesity Counseled on diet: Lower calories, reduced sodium, avoidance of starches such as breads, pasta, and potatoes.  11. Xerostomia I'm not sure whether  medications can be December be part of this. I suspect he is mouth breathing at night.

## 2014-09-01 ENCOUNTER — Other Ambulatory Visit: Payer: Self-pay | Admitting: Internal Medicine

## 2014-09-16 ENCOUNTER — Other Ambulatory Visit: Payer: Self-pay | Admitting: Internal Medicine

## 2014-10-08 DIAGNOSIS — H34832 Tributary (branch) retinal vein occlusion, left eye: Secondary | ICD-10-CM | POA: Diagnosis not present

## 2014-10-08 DIAGNOSIS — H3581 Retinal edema: Secondary | ICD-10-CM | POA: Diagnosis not present

## 2014-12-08 ENCOUNTER — Other Ambulatory Visit: Payer: Medicare Other

## 2014-12-08 DIAGNOSIS — E785 Hyperlipidemia, unspecified: Secondary | ICD-10-CM

## 2014-12-08 DIAGNOSIS — E039 Hypothyroidism, unspecified: Secondary | ICD-10-CM | POA: Diagnosis not present

## 2014-12-08 DIAGNOSIS — E119 Type 2 diabetes mellitus without complications: Secondary | ICD-10-CM | POA: Diagnosis not present

## 2014-12-09 LAB — BASIC METABOLIC PANEL
BUN/Creatinine Ratio: 12 (ref 10–22)
BUN: 13 mg/dL (ref 8–27)
CALCIUM: 9.4 mg/dL (ref 8.6–10.2)
CO2: 23 mmol/L (ref 18–29)
Chloride: 99 mmol/L (ref 97–108)
Creatinine, Ser: 1.12 mg/dL (ref 0.76–1.27)
GFR calc Af Amer: 75 mL/min/{1.73_m2} (ref >59)
GFR, EST NON AFRICAN AMERICAN: 65 mL/min/{1.73_m2} (ref >59)
GLUCOSE: 108 mg/dL — AB (ref 65–99)
Potassium: 4.5 mmol/L (ref 3.5–5.2)
Sodium: 137 mmol/L (ref 134–144)

## 2014-12-09 LAB — MICROALBUMIN, URINE: MICROALBUM., U, RANDOM: 4.2 ug/mL (ref 0.0–17.0)

## 2014-12-09 LAB — LIPID PANEL
CHOLESTEROL TOTAL: 203 mg/dL — AB (ref 100–199)
Chol/HDL Ratio: 5.1 ratio units — ABNORMAL HIGH (ref 0.0–5.0)
HDL: 40 mg/dL (ref >39)
LDL Calculated: 107 mg/dL — ABNORMAL HIGH (ref 0–99)
Triglycerides: 282 mg/dL — ABNORMAL HIGH (ref 0–149)
VLDL CHOLESTEROL CAL: 56 mg/dL — AB (ref 5–40)

## 2014-12-09 LAB — HEMOGLOBIN A1C
Est. average glucose Bld gHb Est-mCnc: 128 mg/dL
HEMOGLOBIN A1C: 6.1 % — AB (ref 4.8–5.6)

## 2014-12-09 LAB — TSH: TSH: 2.33 u[IU]/mL (ref 0.450–4.500)

## 2014-12-10 ENCOUNTER — Ambulatory Visit (INDEPENDENT_AMBULATORY_CARE_PROVIDER_SITE_OTHER): Payer: Medicare Other | Admitting: Internal Medicine

## 2014-12-10 ENCOUNTER — Encounter: Payer: Self-pay | Admitting: Internal Medicine

## 2014-12-10 VITALS — BP 120/68 | HR 72 | Temp 97.4°F | Ht 66.0 in | Wt 225.8 lb

## 2014-12-10 DIAGNOSIS — E785 Hyperlipidemia, unspecified: Secondary | ICD-10-CM

## 2014-12-10 DIAGNOSIS — M545 Low back pain, unspecified: Secondary | ICD-10-CM

## 2014-12-10 DIAGNOSIS — I10 Essential (primary) hypertension: Secondary | ICD-10-CM

## 2014-12-10 DIAGNOSIS — R609 Edema, unspecified: Secondary | ICD-10-CM

## 2014-12-10 DIAGNOSIS — R682 Dry mouth, unspecified: Secondary | ICD-10-CM

## 2014-12-10 DIAGNOSIS — K117 Disturbances of salivary secretion: Secondary | ICD-10-CM

## 2014-12-10 DIAGNOSIS — E119 Type 2 diabetes mellitus without complications: Secondary | ICD-10-CM | POA: Diagnosis not present

## 2014-12-10 DIAGNOSIS — J4520 Mild intermittent asthma, uncomplicated: Secondary | ICD-10-CM | POA: Diagnosis not present

## 2014-12-10 DIAGNOSIS — E669 Obesity, unspecified: Secondary | ICD-10-CM | POA: Diagnosis not present

## 2014-12-10 DIAGNOSIS — E039 Hypothyroidism, unspecified: Secondary | ICD-10-CM | POA: Diagnosis not present

## 2014-12-10 DIAGNOSIS — K219 Gastro-esophageal reflux disease without esophagitis: Secondary | ICD-10-CM

## 2014-12-10 NOTE — Progress Notes (Signed)
Patient ID: Nicholas Rivera, male   DOB: Nov 22, 1940, 74 y.o.   MRN: [id number]    Facility  [facility]    Place of Service:   OFFICE   No Known Allergies  Chief Complaint  Patient presents with  . Medical Management of Chronic Issues    3 Month Follow up    HPI:  Dm htn hld gerd hypothy Back pain Obese: lost 22# Asthma Edema Ca kidney Diabetes mellitus without complication: Well controlled  Hyperlipidemia: Controlled  Essential hypertension: Controlled  Hypothyroidism, unspecified hypothyroidism type: Compensated  Midline low back pain without sciatica: Improved  Obesity: Lost 21 pounds last 3 months intentionally.  Gastroesophageal reflux disease without esophagitis: Improved. He has stopped omeprazole.  Edema: Improved  Asthma, chronic, mild intermittent, uncomplicated: Doing well on Singulair only.  Xerostomia: Improved     Medications: Patient's Medications  New Prescriptions   No medications on file  Previous Medications   ASPIRIN 81 MG TABLET    One daily to revent heart attack or stroke   BISOPROLOL-HYDROCHLOROTHIAZIDE (ZIAC) 5-6.25 MG PER TABLET    TAKE 1 TABLET BY MOUTH EVERY DAY   FUROSEMIDE (LASIX) 20 MG TABLET    TAKE 1 TABLET BY MOUTH EVERY MORNING TO HELP CONTROL EDEMA   GLUCOSE BLOOD TEST STRIP    Use in One Touch Meter to check blood sugar once daily. Dx 250.00   LEVOTHYROXINE (SYNTHROID, LEVOTHROID) 50 MCG TABLET    Take one daily for thyroid supplement.   LOSARTAN (COZAAR) 100 MG TABLET    One daly to control BP   METFORMIN (GLUCOPHAGE) 500 MG TABLET    One twice daily to control diabetes   MONTELUKAST (SINGULAIR) 10 MG TABLET    TAKE 1 TABLET BY MOUTH EVERY NIGHT AT BEDTIME TO HELP WITH BREATHING   MULTIPLE VITAMINS-MINERALS (MULTIVITAMIN WITH MINERALS) TABLET    Take 1 tablet by mouth daily. Take one tablet by mouth daily.   NAPROXEN SODIUM (ANAPROX) 220 MG TABLET    Take 220 mg by mouth 2 (two) times daily with a meal.   ONE TOUCH LANCETS  MISC    Use to check blood sugar once daily Dx: 250.00   VITAMIN C (ASCORBIC ACID) 500 MG TABLET    Take 500 mg by mouth daily. Take two tablets by mouth daily.  Modified Medications   No medications on file  Discontinued Medications   BUDESONIDE-FORMOTEROL (SYMBICORT) 160-4.5 MCG/ACT INHALER    2 puffs inhaled twice daily to help breathing   OMEPRAZOLE (PRILOSEC) 20 MG CAPSULE    Take one capsule by mouth daily to reduce stomach acid.     Review of Systems  Constitutional:       Obese  HENT: Positive for hearing loss.   Eyes: Negative.   Respiratory: Negative.   Cardiovascular: Negative for chest pain, palpitations and leg swelling.  Gastrointestinal: Negative for abdominal pain and abdominal distention.  Endocrine: Negative for cold intolerance, heat intolerance, polydipsia, polyphagia and polyuria.       Hx DM and hypothyroidism.  Genitourinary:       Hx Ca left tkidney and removal surgically. Hx BPH. Hx elevated PSA.  Musculoskeletal: Positive for back pain.       ResolvedsSwollen left foot that was mildly painful when he was walking.  Skin: Negative.   Neurological: Negative.   Hematological: Negative.   Psychiatric/Behavioral: Negative.     Filed Vitals:   12/10/14 1338  BP: 120/68  Pulse: 72  Temp: 97.4 F (36.3 C)  TempSrc: Oral  Height: 5\' 6"  (1.676 m)  Weight: 225 lb 12.8 oz (102.422 kg)   Body mass index is 36.46 kg/(m^2).  Physical Exam  Constitutional: He is oriented to person, place, and time. No distress.  Obese.  HENT:  Head: Normocephalic.  Right Ear: External ear normal.  Left Ear: External ear normal.  Eyes: Conjunctivae and EOM are normal. Pupils are equal, round, and reactive to light.  Corrective lenses.  Neck: Normal range of motion. Neck supple. No JVD present. No tracheal deviation present. No thyromegaly present.  Cardiovascular: Normal rate, regular rhythm, normal heart sounds and intact distal pulses.  Exam reveals no gallop and no  friction rub.   No murmur heard. Pulmonary/Chest: Effort normal and breath sounds normal. No respiratory distress. He has no wheezes. He has no rales. He exhibits no tenderness.  Abdominal: Soft. Bowel sounds are normal. He exhibits no distension and no mass. There is no tenderness.  Musculoskeletal: Normal range of motion. He exhibits edema and tenderness.  Lymphadenopathy:    He has no cervical adenopathy.  Neurological: He is alert and oriented to person, place, and time. No cranial nerve deficit. Coordination normal.  Skin: No rash noted. There is erythema.  Psychiatric: He has a normal mood and affect. His behavior is normal. Judgment and thought content normal.     Labs reviewed: Appointment on 12/08/2014  Component Date Value Ref Range Status  . Microalbum.,U,Random 12/08/2014 4.2  0.0 - 17.0 ug/mL Final  . Hgb A1c MFr Bld 12/08/2014 6.1* 4.8 - 5.6 % Final   Comment:          Pre-diabetes: 5.7 - 6.4          Diabetes: >6.4          Glycemic control for adults with diabetes: <7.0   . Est. average glucose Bld gHb Est-m* 12/08/2014 128   Final  . Glucose 12/08/2014 108* 65 - 99 mg/dL Final  . BUN 12/08/2014 13  8 - 27 mg/dL Final  . Creatinine, Ser 12/08/2014 1.12  0.76 - 1.27 mg/dL Final  . GFR calc non Af Amer 12/08/2014 65  >59 mL/min/1.73 Final  . GFR calc Af Amer 12/08/2014 75  >59 mL/min/1.73 Final  . BUN/Creatinine Ratio 12/08/2014 12  10 - 22 Final  . Sodium 12/08/2014 137  134 - 144 mmol/L Final  . Potassium 12/08/2014 4.5  3.5 - 5.2 mmol/L Final  . Chloride 12/08/2014 99  97 - 108 mmol/L Final  . CO2 12/08/2014 23  18 - 29 mmol/L Final  . Calcium 12/08/2014 9.4  8.6 - 10.2 mg/dL Final  . Cholesterol, Total 12/08/2014 203* 100 - 199 mg/dL Final  . Triglycerides 12/08/2014 282* 0 - 149 mg/dL Final  . HDL 12/08/2014 40  >39 mg/dL Final   Comment: According to ATP-III Guidelines, HDL-C >59 mg/dL is considered a negative risk factor for CHD.   Marland Kitchen VLDL Cholesterol Cal  12/08/2014 56* 5 - 40 mg/dL Final  . LDL Calculated 12/08/2014 107* 0 - 99 mg/dL Final  . Chol/HDL Ratio 12/08/2014 5.1* 0.0 - 5.0 ratio units Final   Comment:                                   T. Chol/HDL Ratio  Men  Women                               1/2 Avg.Risk  3.4    3.3                                   Avg.Risk  5.0    4.4                                2X Avg.Risk  9.6    7.1                                3X Avg.Risk 23.4   11.0   . TSH 12/08/2014 2.330  0.450 - 4.500 uIU/mL Final     Assessment/Plan  1. Diabetes mellitus without complication - Hemoglobin A1c; Future - Basic metabolic panel; Future - Microalbumin, urine; Future  2. Hyperlipidemia - Lipid panel; Future  3. Essential hypertension controlled - Basic metabolic panel; Future  4. Hypothyroidism, unspecified hypothyroidism type compensated  5. Midline low back pain without sciatica improved  6. Obesity Continue weight loss  7. Gastroesophageal reflux disease without esophagitis improved  8. Edema improved  9. Asthma, chronic, mild intermittent, uncomplicated stable  10. Xerostomia improved

## 2015-01-06 DIAGNOSIS — E11329 Type 2 diabetes mellitus with mild nonproliferative diabetic retinopathy without macular edema: Secondary | ICD-10-CM | POA: Diagnosis not present

## 2015-01-06 DIAGNOSIS — H34832 Tributary (branch) retinal vein occlusion, left eye: Secondary | ICD-10-CM | POA: Diagnosis not present

## 2015-01-06 DIAGNOSIS — H3581 Retinal edema: Secondary | ICD-10-CM | POA: Diagnosis not present

## 2015-01-14 ENCOUNTER — Encounter: Payer: Self-pay | Admitting: Internal Medicine

## 2015-01-14 ENCOUNTER — Ambulatory Visit (INDEPENDENT_AMBULATORY_CARE_PROVIDER_SITE_OTHER): Payer: Medicare Other | Admitting: Internal Medicine

## 2015-01-14 VITALS — BP 128/72 | HR 67 | Temp 98.1°F | Ht 66.0 in | Wt 225.4 lb

## 2015-01-14 DIAGNOSIS — J45901 Unspecified asthma with (acute) exacerbation: Secondary | ICD-10-CM | POA: Diagnosis not present

## 2015-01-14 DIAGNOSIS — I10 Essential (primary) hypertension: Secondary | ICD-10-CM

## 2015-01-14 MED ORDER — BUDESONIDE-FORMOTEROL FUMARATE 160-4.5 MCG/ACT IN AERO: INHALATION_SPRAY | RESPIRATORY_TRACT | Status: DC

## 2015-01-14 MED ORDER — CETIRIZINE HCL 10 MG PO TABS: ORAL_TABLET | ORAL | Status: DC

## 2015-01-14 MED ORDER — FUROSEMIDE 20 MG PO TABS: ORAL_TABLET | ORAL | Status: DC

## 2015-01-14 NOTE — Patient Instructions (Signed)
Get cetirizine (Zyrtec) 10 mg over the counter and take daily.

## 2015-01-14 NOTE — Progress Notes (Signed)
Patient ID: Nicholas Rivera, male   DOB: February 25, 1941, 74 y.o.   MRN: [id number]    Facility  [facility]    Place of Service:   OFFICE    No Known Allergies  Chief Complaint  Patient presents with  . Acute Visit    Complains of Allergies and cough getting worse since last Saturday    HPI:  Coughing clear to yellow phlegm. No fever. Rhinorrhea. Using cough drops from [employer] and [employer] cough drop. No wheezing, but feels dyspneic at times. Using Singulair daily. Not using other antihistamine. Had asthma as a child.  Medications: Patient's Medications  New Prescriptions   No medications on file  Previous Medications   ASPIRIN 81 MG TABLET    One daily to revent heart attack or stroke   BISOPROLOL-HYDROCHLOROTHIAZIDE (ZIAC) 5-6.25 MG PER TABLET    TAKE 1 TABLET BY MOUTH EVERY DAY   FUROSEMIDE (LASIX) 20 MG TABLET    TAKE 1 TABLET BY MOUTH EVERY MORNING TO HELP CONTROL EDEMA   GLUCOSE BLOOD TEST STRIP    Use in One Touch Meter to check blood sugar once daily. Dx 250.00   LEVOTHYROXINE (SYNTHROID, LEVOTHROID) 50 MCG TABLET    Take one daily for thyroid supplement.   LOSARTAN (COZAAR) 100 MG TABLET    One daly to control BP   METFORMIN (GLUCOPHAGE) 500 MG TABLET    One twice daily to control diabetes   MONTELUKAST (SINGULAIR) 10 MG TABLET    TAKE 1 TABLET BY MOUTH EVERY NIGHT AT BEDTIME TO HELP WITH BREATHING   MULTIPLE VITAMINS-MINERALS (MULTIVITAMIN WITH MINERALS) TABLET    Take 1 tablet by mouth daily. Take one tablet by mouth daily.   NAPROXEN SODIUM (ANAPROX) 220 MG TABLET    Take 220 mg by mouth 2 (two) times daily with a meal.   ONE TOUCH LANCETS MISC    Use to check blood sugar once daily Dx: 250.00   VITAMIN C (ASCORBIC ACID) 500 MG TABLET    Take 500 mg by mouth daily. Take two tablets by mouth daily.  Modified Medications   No medications on file  Discontinued Medications   No medications on file     Review of Systems  Constitutional:       Obese  HENT: Positive for hearing  loss.   Eyes: Negative.   Respiratory: Positive for cough and chest tightness.   Cardiovascular: Negative for chest pain, palpitations and leg swelling.  Gastrointestinal: Negative for abdominal pain and abdominal distention.  Endocrine: Negative for cold intolerance, heat intolerance, polydipsia, polyphagia and polyuria.       Hx DM and hypothyroidism.  Genitourinary:       Hx Ca left tkidney and removal surgically. Hx BPH. Hx elevated PSA.  Musculoskeletal: Positive for back pain.       ResolvedsSwollen left foot that was mildly painful when he was walking.  Skin: Negative.   Neurological: Negative.   Hematological: Negative.   Psychiatric/Behavioral: Negative.     Filed Vitals:   01/14/15 1452  BP: 128/72  Pulse: 67  Temp: 98.1 F (36.7 C)  TempSrc: Oral  Height: 5\' 6"  (1.676 m)  Weight: 225 lb 6.4 oz (102.241 kg)   Body mass index is 36.4 kg/(m^2).  Physical Exam  Constitutional: He is oriented to person, place, and time. No distress.  Obese.  HENT:  Head: Normocephalic.  Right Ear: External ear normal.  Left Ear: External ear normal.  Eyes: Conjunctivae and EOM are normal. Pupils are equal,  round, and reactive to light.  Corrective lenses.  Neck: Normal range of motion. Neck supple. No JVD present. No tracheal deviation present. No thyromegaly present.  Cardiovascular: Normal rate, regular rhythm, normal heart sounds and intact distal pulses.  Exam reveals no gallop and no friction rub.   No murmur heard. Pulmonary/Chest: Effort normal and breath sounds normal. No respiratory distress. He has no wheezes. He has no rales. He exhibits no tenderness.  Abdominal: Soft. Bowel sounds are normal. He exhibits no distension and no mass. There is no tenderness.  Musculoskeletal: Normal range of motion. He exhibits edema and tenderness.  Lymphadenopathy:    He has no cervical adenopathy.  Neurological: He is alert and oriented to person, place, and time. No cranial nerve  deficit. Coordination normal.  Skin: No rash noted. There is erythema.  Psychiatric: He has a normal mood and affect. His behavior is normal. Judgment and thought content normal.     Labs reviewed: Appointment on 12/08/2014  Component Date Value Ref Range Status  . Microalbum.,U,Random 12/08/2014 4.2  0.0 - 17.0 ug/mL Final  . Hgb A1c MFr Bld 12/08/2014 6.1* 4.8 - 5.6 % Final   Comment:          Pre-diabetes: 5.7 - 6.4          Diabetes: >6.4          Glycemic control for adults with diabetes: <7.0   . Est. average glucose Bld gHb Est-m* 12/08/2014 128   Final  . Glucose 12/08/2014 108* 65 - 99 mg/dL Final  . BUN 12/08/2014 13  8 - 27 mg/dL Final  . Creatinine, Ser 12/08/2014 1.12  0.76 - 1.27 mg/dL Final  . GFR calc non Af Amer 12/08/2014 65  >59 mL/min/1.73 Final  . GFR calc Af Amer 12/08/2014 75  >59 mL/min/1.73 Final  . BUN/Creatinine Ratio 12/08/2014 12  10 - 22 Final  . Sodium 12/08/2014 137  134 - 144 mmol/L Final  . Potassium 12/08/2014 4.5  3.5 - 5.2 mmol/L Final  . Chloride 12/08/2014 99  97 - 108 mmol/L Final  . CO2 12/08/2014 23  18 - 29 mmol/L Final  . Calcium 12/08/2014 9.4  8.6 - 10.2 mg/dL Final  . Cholesterol, Total 12/08/2014 203* 100 - 199 mg/dL Final  . Triglycerides 12/08/2014 282* 0 - 149 mg/dL Final  . HDL 12/08/2014 40  >39 mg/dL Final   Comment: According to ATP-III Guidelines, HDL-C >59 mg/dL is considered a negative risk factor for CHD.   Marland Kitchen VLDL Cholesterol Cal 12/08/2014 56* 5 - 40 mg/dL Final  . LDL Calculated 12/08/2014 107* 0 - 99 mg/dL Final  . Chol/HDL Ratio 12/08/2014 5.1* 0.0 - 5.0 ratio units Final   Comment:                                   T. Chol/HDL Ratio                                             Men  Women                               1/2 Avg.Risk  3.4    3.3  Avg.Risk  5.0    4.4                                2X Avg.Risk  9.6    7.1                                3X Avg.Risk 23.4   11.0   . TSH  12/08/2014 2.330  0.450 - 4.500 uIU/mL Final     Assessment/Plan  1. Asthma with acute exacerbation, unspecified asthma severity - budesonide-formoterol (SYMBICORT) 160-4.5 MCG/ACT inhaler; Inhale one puff in lungs twice daily  Dispense: 1 Inhaler; Refill: 3 - cetirizine (ZYRTEC) 10 MG tablet; One daily to treat allergies  Dispense: 30 tablet; Refill: 5  2. Essential hypertension Stop Ziac

## 2015-01-19 NOTE — Progress Notes (Signed)
This encounter was created in error - please disregard.

## 2015-01-27 DIAGNOSIS — H34832 Tributary (branch) retinal vein occlusion, left eye: Secondary | ICD-10-CM | POA: Diagnosis not present

## 2015-01-27 DIAGNOSIS — H3581 Retinal edema: Secondary | ICD-10-CM | POA: Diagnosis not present

## 2015-02-09 ENCOUNTER — Other Ambulatory Visit (HOSPITAL_COMMUNITY): Payer: Self-pay | Admitting: Urology

## 2015-02-09 ENCOUNTER — Ambulatory Visit (HOSPITAL_COMMUNITY)
Admission: RE | Admit: 2015-02-09 | Discharge: 2015-02-09 | Disposition: A | Discharge status: Home / Self Care | Payer: Medicare Other | Source: Ambulatory Visit | Attending: Urology | Admitting: Urology

## 2015-02-09 DIAGNOSIS — C649 Malignant neoplasm of unspecified kidney, except renal pelvis: Secondary | ICD-10-CM

## 2015-02-09 DIAGNOSIS — Z85528 Personal history of other malignant neoplasm of kidney: Secondary | ICD-10-CM | POA: Diagnosis not present

## 2015-02-09 DIAGNOSIS — R972 Elevated prostate specific antigen [PSA]: Secondary | ICD-10-CM | POA: Diagnosis not present

## 2015-02-20 ENCOUNTER — Ambulatory Visit: Payer: Medicare Other | Admitting: Internal Medicine

## 2015-02-20 ENCOUNTER — Ambulatory Visit (INDEPENDENT_AMBULATORY_CARE_PROVIDER_SITE_OTHER): Payer: Medicare Other | Admitting: Internal Medicine

## 2015-02-20 ENCOUNTER — Encounter: Payer: Self-pay | Admitting: Internal Medicine

## 2015-02-20 VITALS — BP 110/60 | HR 73 | Temp 97.2°F | Resp 20 | Ht 66.0 in | Wt 218.0 lb

## 2015-02-20 DIAGNOSIS — E119 Type 2 diabetes mellitus without complications: Secondary | ICD-10-CM

## 2015-02-20 DIAGNOSIS — N200 Calculus of kidney: Secondary | ICD-10-CM | POA: Diagnosis not present

## 2015-02-20 DIAGNOSIS — E039 Hypothyroidism, unspecified: Secondary | ICD-10-CM

## 2015-02-20 DIAGNOSIS — Z7189 Other specified counseling: Secondary | ICD-10-CM | POA: Diagnosis not present

## 2015-02-20 DIAGNOSIS — N401 Enlarged prostate with lower urinary tract symptoms: Secondary | ICD-10-CM | POA: Diagnosis not present

## 2015-02-20 DIAGNOSIS — J4520 Mild intermittent asthma, uncomplicated: Secondary | ICD-10-CM | POA: Diagnosis not present

## 2015-02-20 DIAGNOSIS — R3916 Straining to void: Secondary | ICD-10-CM | POA: Diagnosis not present

## 2015-02-20 DIAGNOSIS — M545 Low back pain, unspecified: Secondary | ICD-10-CM

## 2015-02-20 DIAGNOSIS — Z85528 Personal history of other malignant neoplasm of kidney: Secondary | ICD-10-CM | POA: Diagnosis not present

## 2015-02-20 DIAGNOSIS — I10 Essential (primary) hypertension: Secondary | ICD-10-CM

## 2015-02-20 DIAGNOSIS — R972 Elevated prostate specific antigen [PSA]: Secondary | ICD-10-CM | POA: Diagnosis not present

## 2015-02-20 NOTE — Patient Instructions (Signed)
Reviewed sun chlorella ingredients and no interactions with medication determined. Ok to start supplement as directed per bottle instructions  Follow up as scheduled with Dr Nyoka Cowden

## 2015-02-20 NOTE — Progress Notes (Signed)
Patient ID: Nicholas Rivera, male   DOB: 1940-12-23, 74 y.o.   MRN: [id number]    Location:    [facility]   Place of Service:   OFFICE  Chief Complaint  Patient presents with  . Medical Management of Chronic Issues    Talk about medications    HPI:  74 yo male seen today to discuss medications. He would like to start Sun Chlorella which is a nutritional supplement with vitamins, amino acids and protein. Reviewed contents of pill. No contraindication determined and no obvious drug-drug interactions identified. He has HTN and takes lasix and losartan. Thyroid stable on levothyroxine. DM stable on metformin. Joint pain controlled on naproxen. He has asthma and seasonal allergy which is stable on zyrtec, symbicort and singulair. He also takes MVI and ASA daily. No low BS reactions. No new numbness or tingling  Past Medical History  Diagnosis Date  . Diabetes mellitus without complication 9622  . Hypertension 2012  . Unspecified hypothyroidism 2012  . Nephrolithiasis 2012  . Cancer of kidney 2012  . Obesity, unspecified   . Cataract 2013  . Other and unspecified hyperlipidemia 04/03/2013  . Back pain, lumbosacral 2014  . Pain, joint, ankle and foot     Past Surgical History  Procedure Laterality Date  . Kidney surgery  12/2010    had cancer removed; Dr. Alinda Money  . Rotator cuff repair      bilateral; Dr. [name]    Patient Care Team: Estill Dooms, MD as PCP - General (Internal Medicine) [name], MD as Consulting Physician (Urology) [name], MD as Consulting Physician (Ophthalmology)  History   Social History  . Marital Status: Married    Spouse Name: N/A  . Number of Children: N/A  . Years of Education: N/A   Occupational History  . Retired    Social History Main Topics  . Smoking status: Never Smoker   . Smokeless tobacco: Not on file  . Alcohol Use: No  . Drug Use: No  . Sexual Activity: Yes   Other Topics Concern  . Not on file   Social History Narrative     Patient was previously married. His wife has died from cancer.     reports that he has never smoked. He does not have any smokeless tobacco history on file. He reports that he does not drink alcohol or use illicit drugs.  No Known Allergies  Medications: Patient's Medications  New Prescriptions   No medications on file  Previous Medications   ASPIRIN 81 MG TABLET    One daily to revent heart attack or stroke   BUDESONIDE-FORMOTEROL (SYMBICORT) 160-4.5 MCG/ACT INHALER    Inhale one puff in lungs twice daily   CETIRIZINE (ZYRTEC) 10 MG TABLET    One daily to treat allergies   FUROSEMIDE (LASIX) 20 MG TABLET    One daily to control fluid retention   GLUCOSE BLOOD TEST STRIP    Use in One Touch Meter to check blood sugar once daily. Dx 250.00   LEVOTHYROXINE (SYNTHROID, LEVOTHROID) 50 MCG TABLET    Take one daily for thyroid supplement.   LOSARTAN (COZAAR) 100 MG TABLET    One daly to control BP   METFORMIN (GLUCOPHAGE) 500 MG TABLET    One twice daily to control diabetes   MONTELUKAST (SINGULAIR) 10 MG TABLET    TAKE 1 TABLET BY MOUTH EVERY NIGHT AT BEDTIME TO HELP WITH BREATHING   MULTIPLE VITAMINS-MINERALS (MULTIVITAMIN WITH MINERALS) TABLET    Take  1 tablet by mouth daily. Take one tablet by mouth daily.   NAPROXEN SODIUM (ANAPROX) 220 MG TABLET    Take 220 mg by mouth 2 (two) times daily with a meal.   ONE TOUCH LANCETS MISC    Use to check blood sugar once daily Dx: 250.00   VITAMIN C (ASCORBIC ACID) 500 MG TABLET    Take 500 mg by mouth daily. Take two tablets by mouth daily.  Modified Medications   No medications on file  Discontinued Medications   No medications on file    Review of Systems  All other systems reviewed and are negative.   Filed Vitals:   02/20/15 1429  BP: 110/60  Pulse: 73  Temp: 97.2 F (36.2 C)  TempSrc: Oral  Resp: 20  Height: 5\' 6"  (1.676 m)  Weight: 218 lb (98.884 kg)  SpO2: 94%   Body mass index is 35.2 kg/(m^2).  Physical Exam   Constitutional: He is oriented to person, place, and time. He appears well-developed and well-nourished. No distress.  Neurological: He is alert and oriented to person, place, and time.  Skin: Skin is warm and dry. No rash noted.  Psychiatric: He has a normal mood and affect. His behavior is normal. Judgment and thought content normal.     Labs reviewed: Appointment on 12/08/2014  Component Date Value Ref Range Status  . Microalbum.,U,Random 12/08/2014 4.2  0.0 - 17.0 ug/mL Final  . Hgb A1c MFr Bld 12/08/2014 6.1* 4.8 - 5.6 % Final   Comment:          Pre-diabetes: 5.7 - 6.4          Diabetes: >6.4          Glycemic control for adults with diabetes: <7.0   . Est. average glucose Bld gHb Est-m* 12/08/2014 128   Final  . Glucose 12/08/2014 108* 65 - 99 mg/dL Final  . BUN 12/08/2014 13  8 - 27 mg/dL Final  . Creatinine, Ser 12/08/2014 1.12  0.76 - 1.27 mg/dL Final  . GFR calc non Af Amer 12/08/2014 65  >59 mL/min/1.73 Final  . GFR calc Af Amer 12/08/2014 75  >59 mL/min/1.73 Final  . BUN/Creatinine Ratio 12/08/2014 12  10 - 22 Final  . Sodium 12/08/2014 137  134 - 144 mmol/L Final  . Potassium 12/08/2014 4.5  3.5 - 5.2 mmol/L Final  . Chloride 12/08/2014 99  97 - 108 mmol/L Final  . CO2 12/08/2014 23  18 - 29 mmol/L Final  . Calcium 12/08/2014 9.4  8.6 - 10.2 mg/dL Final  . Cholesterol, Total 12/08/2014 203* 100 - 199 mg/dL Final  . Triglycerides 12/08/2014 282* 0 - 149 mg/dL Final  . HDL 12/08/2014 40  >39 mg/dL Final   Comment: According to ATP-III Guidelines, HDL-C >59 mg/dL is considered a negative risk factor for CHD.   Marland Kitchen VLDL Cholesterol Cal 12/08/2014 56* 5 - 40 mg/dL Final  . LDL Calculated 12/08/2014 107* 0 - 99 mg/dL Final  . Chol/HDL Ratio 12/08/2014 5.1* 0.0 - 5.0 ratio units Final   Comment:                                   T. Chol/HDL Ratio  Men  Women                               1/2 Avg.Risk  3.4    3.3                                    Avg.Risk  5.0    4.4                                2X Avg.Risk  9.6    7.1                                3X Avg.Risk 23.4   11.0   . TSH 12/08/2014 2.330  0.450 - 4.500 uIU/mL Final    Dg Chest 2 View  02/09/2015   CLINICAL DATA:  History of renal cell carcinoma  EXAM: CHEST  2 VIEW  COMPARISON:  01/24/2014.  FINDINGS: Mediastinum and hilar structures are normal. Lungs are clear of acute infiltrates. No pleural effusion or pneumothorax. Heart size normal. Stable eventration left hemidiaphragm. No focal bony abnormality.  IMPRESSION: No acute or focal abnormality.   Electronically Signed   By: Marcello Moores  Register   On: 02/09/2015 09:02     Assessment/Plan   ICD-9-CM ICD-10-CM   1. Encounter for medication review and counseling V65.49 Z71.89   2. Diabetes mellitus without complication - stable 585.27 E11.9   3. Essential hypertension - stable 401.9 I10   4. Hypothyroidism, unspecified hypothyroidism type - stable 244.9 E03.9   5. Asthma, chronic, mild intermittent, uncomplicated - stable 782.42 J45.20   6. Midline low back pain without sciatica - stable 724.2 M54.5     --Reviewed sun chlorella ingredients and no interactions with medication determined. Ok to start supplement as directed per bottle instructions  --Follow up as scheduled with Dr Ledell Noss. Perlie Gold  [facility] [address] [place], [place] [address] [phone] Cell (Monday-Friday 8 AM - 5 PM) [phone] After 5 PM and follow prompts

## 2015-02-24 ENCOUNTER — Ambulatory Visit: Payer: Medicare Other | Admitting: Internal Medicine

## 2015-04-01 ENCOUNTER — Other Ambulatory Visit: Payer: Self-pay | Admitting: Internal Medicine

## 2015-04-09 ENCOUNTER — Other Ambulatory Visit: Payer: Self-pay | Admitting: Internal Medicine

## 2015-04-15 DIAGNOSIS — R972 Elevated prostate specific antigen [PSA]: Secondary | ICD-10-CM | POA: Diagnosis not present

## 2015-04-20 ENCOUNTER — Other Ambulatory Visit: Payer: Medicare Other

## 2015-04-22 ENCOUNTER — Ambulatory Visit: Payer: Medicare Other | Admitting: Internal Medicine

## 2015-05-08 ENCOUNTER — Other Ambulatory Visit: Payer: Medicare Other

## 2015-05-08 DIAGNOSIS — E785 Hyperlipidemia, unspecified: Secondary | ICD-10-CM

## 2015-05-08 DIAGNOSIS — I10 Essential (primary) hypertension: Secondary | ICD-10-CM | POA: Diagnosis not present

## 2015-05-08 DIAGNOSIS — E119 Type 2 diabetes mellitus without complications: Secondary | ICD-10-CM

## 2015-05-09 LAB — BASIC METABOLIC PANEL
BUN/Creatinine Ratio: 12 (ref 10–22)
BUN: 13 mg/dL (ref 8–27)
CALCIUM: 9.3 mg/dL (ref 8.6–10.2)
CO2: 22 mmol/L (ref 18–29)
CREATININE: 1.11 mg/dL (ref 0.76–1.27)
Chloride: 102 mmol/L (ref 97–108)
GFR calc Af Amer: 76 mL/min/{1.73_m2} (ref >59)
GFR, EST NON AFRICAN AMERICAN: 66 mL/min/{1.73_m2} (ref >59)
Glucose: 105 mg/dL — ABNORMAL HIGH (ref 65–99)
Potassium: 4.6 mmol/L (ref 3.5–5.2)
Sodium: 139 mmol/L (ref 134–144)

## 2015-05-09 LAB — HEMOGLOBIN A1C
Est. average glucose Bld gHb Est-mCnc: 126 mg/dL
Hgb A1c MFr Bld: 6 % — ABNORMAL HIGH (ref 4.8–5.6)

## 2015-05-09 LAB — LIPID PANEL
CHOL/HDL RATIO: 4.7 ratio (ref 0.0–5.0)
Cholesterol, Total: 199 mg/dL (ref 100–199)
HDL: 42 mg/dL (ref >39)
LDL CALC: 124 mg/dL — AB (ref 0–99)
TRIGLYCERIDES: 164 mg/dL — AB (ref 0–149)
VLDL CHOLESTEROL CAL: 33 mg/dL (ref 5–40)

## 2015-05-09 LAB — MICROALBUMIN, URINE: Microalbumin, Urine: <3 ug/mL

## 2015-05-13 ENCOUNTER — Encounter: Payer: Self-pay | Admitting: Internal Medicine

## 2015-05-13 ENCOUNTER — Ambulatory Visit (INDEPENDENT_AMBULATORY_CARE_PROVIDER_SITE_OTHER): Payer: Medicare Other | Admitting: Internal Medicine

## 2015-05-13 VITALS — BP 122/78 | HR 76 | Temp 97.9°F | Ht 66.0 in | Wt 199.6 lb

## 2015-05-13 DIAGNOSIS — R609 Edema, unspecified: Secondary | ICD-10-CM

## 2015-05-13 DIAGNOSIS — E785 Hyperlipidemia, unspecified: Secondary | ICD-10-CM | POA: Diagnosis not present

## 2015-05-13 DIAGNOSIS — E039 Hypothyroidism, unspecified: Secondary | ICD-10-CM | POA: Diagnosis not present

## 2015-05-13 DIAGNOSIS — L723 Sebaceous cyst: Secondary | ICD-10-CM | POA: Diagnosis not present

## 2015-05-13 DIAGNOSIS — Z23 Encounter for immunization: Secondary | ICD-10-CM | POA: Diagnosis not present

## 2015-05-13 DIAGNOSIS — I10 Essential (primary) hypertension: Secondary | ICD-10-CM

## 2015-05-13 DIAGNOSIS — E669 Obesity, unspecified: Secondary | ICD-10-CM

## 2015-05-13 DIAGNOSIS — E119 Type 2 diabetes mellitus without complications: Secondary | ICD-10-CM

## 2015-05-13 MED ORDER — METFORMIN HCL 500 MG PO TABS: ORAL_TABLET | ORAL | Status: DC

## 2015-05-13 MED ORDER — LEVOTHYROXINE SODIUM 50 MCG PO TABS: ORAL_TABLET | ORAL | Status: DC

## 2015-05-13 NOTE — Patient Instructions (Signed)
Reduce Losartan to 1/2 tablet daily for 2 weeks. If BP remains <403 systolic and < 90 diastolic, then stop the Losartan.  The sebaceous cyst is harmless.

## 2015-05-13 NOTE — Progress Notes (Signed)
Patient ID: Nicholas Rivera, male   DOB: 09-07-1940, 74 y.o.   MRN: [id number]     Facility  [facility]    Place of Service:   OFFICE    No Known Allergies  Chief Complaint  Patient presents with  . Medical Management of Chronic Issues    Medical Management of Chronic Issues. 4 Month Follow up    HPI:   Essential hypertension - controlled. Patient would like to see if he can do with less medication.  Diabetes mellitus without complication - A1c and fasting glucose recently better.   Hyperlipidemia - controlled   Hypothyroidism, unspecified hypothyroidism type -compensated   Edema - no further swelling. Off diuretics.  Obesity - significant intentional weight loss. Over 30 pounds a few months.  Sebaceous cyst - wife and son have noted a lump on his back. It is nontender. There is been no discharge of waxy material and no signs of infection. It is painless.  Encounter for immunization - flu vaccine    Medications: Patient's Medications  New Prescriptions   No medications on file  Previous Medications   ASPIRIN 81 MG TABLET    One daily to revent heart attack or stroke   GLUCOSE BLOOD TEST STRIP    Use in One Touch Meter to check blood sugar once daily. Dx 250.00   LEVOTHYROXINE (SYNTHROID, LEVOTHROID) 50 MCG TABLET    Take one daily for thyroid supplement.   LOSARTAN (COZAAR) 100 MG TABLET    TAKE 1 TABLET BY MOUTH DAILY TO CONTROL BLOOD PRESSURE   METFORMIN (GLUCOPHAGE) 500 MG TABLET    TAKE 1 TABLET BY MOUTH TWICE DAILY TO CONTROL DIABETES   MULTIPLE VITAMINS-MINERALS (MULTIVITAMIN WITH MINERALS) TABLET    Take 1 tablet by mouth daily. Take one tablet by mouth daily.   ONE TOUCH ULTRA TEST TEST STRIP    CHECK BLOOD SUGAR ONCE DAILY AS DIRECTED   ONETOUCH DELICA LANCETS FINE MISC    USE TO CHECK BLOOD SUGAR ONCE DAILY   VITAMIN C (ASCORBIC ACID) 500 MG TABLET    Take 500 mg by mouth daily. Take two tablets by mouth daily.  Modified Medications   No medications on file    Discontinued Medications   BUDESONIDE-FORMOTEROL (SYMBICORT) 160-4.5 MCG/ACT INHALER    Inhale one puff in lungs twice daily   CETIRIZINE (ZYRTEC) 10 MG TABLET    One daily to treat allergies   FUROSEMIDE (LASIX) 20 MG TABLET    One daily to control fluid retention   MONTELUKAST (SINGULAIR) 10 MG TABLET    TAKE 1 TABLET BY MOUTH EVERY NIGHT AT BEDTIME TO HELP WITH BREATHING   NAPROXEN SODIUM (ANAPROX) 220 MG TABLET    Take 220 mg by mouth 2 (two) times daily with a meal.     Review of Systems  Constitutional:       Obese  HENT: Positive for hearing loss.   Eyes: Negative.   Respiratory: Positive for cough and chest tightness.   Cardiovascular: Negative for chest pain, palpitations and leg swelling.  Gastrointestinal: Negative for abdominal pain and abdominal distention.  Endocrine: Negative for cold intolerance, heat intolerance, polydipsia, polyphagia and polyuria.       Hx DM and hypothyroidism.  Genitourinary:       Hx Ca left tkidney and removal surgically. Hx BPH. Hx elevated PSA.  Musculoskeletal: Positive for back pain.       ResolvedsSwollen left foot that was mildly painful when he was walking.  Skin:  Nontender sebaceous cyst of upper back  Neurological: Negative.   Hematological: Negative.   Psychiatric/Behavioral: Negative.     Filed Vitals:   05/13/15 1459  BP: 122/78  Pulse: 76  Temp: 97.9 F (36.6 C)  TempSrc: Oral  Height: $Remove'5\' 6"'sCDDAcm$  (1.676 m)  Weight: 199 lb 9.6 oz (90.538 kg)   Body mass index is 32.23 kg/(m^2).  Physical Exam  Constitutional: He is oriented to person, place, and time. No distress.  Obese.  HENT:  Head: Normocephalic.  Right Ear: External ear normal.  Left Ear: External ear normal.  Eyes: Conjunctivae and EOM are normal. Pupils are equal, round, and reactive to light.  Corrective lenses.  Neck: Normal range of motion. Neck supple. No JVD present. No tracheal deviation present. No thyromegaly present.  Cardiovascular: Normal  rate, regular rhythm, normal heart sounds and intact distal pulses.  Exam reveals no gallop and no friction rub.   No murmur heard. Pulmonary/Chest: Effort normal and breath sounds normal. No respiratory distress. He has no wheezes. He has no rales. He exhibits no tenderness.  Abdominal: Soft. Bowel sounds are normal. He exhibits no distension and no mass. There is no tenderness.  Musculoskeletal: Normal range of motion. He exhibits edema and tenderness.  Lymphadenopathy:    He has no cervical adenopathy.  Neurological: He is alert and oriented to person, place, and time. No cranial nerve deficit. Coordination normal.  Skin: No rash noted. No erythema.  Nontender sebaceous cyst of the upper back.  Psychiatric: He has a normal mood and affect. His behavior is normal. Judgment and thought content normal.     Labs reviewed: Lab Summary Latest Ref Rng 05/08/2015 12/08/2014 08/25/2014 05/19/2014  Hemoglobin 13.0-17.0 g/dL (None) (None) (None) (None)  Hematocrit 39.0-52.0 % (None) (None) (None) (None)  White count - (None) (None) (None) (None)  Platelet count - (None) (None) (None) (None)  Sodium 134 - 144 mmol/L 139 137 139 140  Potassium 3.5 - 5.2 mmol/L 4.6 4.5 4.4 4.4  Calcium 8.6 - 10.2 mg/dL 9.3 9.4 9.7 9.9  Phosphorus - (None) (None) (None) (None)  Creatinine 0.76 - 1.27 mg/dL 1.11 1.12 0.99 1.02  AST 0 - 40 IU/L (None) (None) (None) 15  Alk Phos 39 - 117 IU/L (None) (None) (None) 98  Bilirubin 0.0 - 1.2 mg/dL (None) (None) (None) 0.3  Glucose 65 - 99 mg/dL 105(H) 108(H) 110(H) 104(H)  Cholesterol - (None) (None) (None) (None)  HDL cholesterol >39 mg/dL 42 40 39(L) 46  Triglycerides 0 - 149 mg/dL 164(H) 282(H) 345(H) 298(H)  LDL Direct - (None) (None) (None) (None)  LDL Calc 0 - 99 mg/dL 124(H) 107(H) 119(H) 142(H)  Total protein - (None) (None) (None) (None)  Albumin 3.5 - 4.8 g/dL (None) (None) (None) 4.3   Lab Results  Component Value Date   TSH 2.330 12/08/2014   Lab Results   Component Value Date   BUN 13 05/08/2015   Lab Results  Component Value Date   HGBA1C 6.0* 05/08/2015    Assessment/Plan  1. Essential hypertension Reduce losartan to half tablet for 2 weeks, then if blood pressure has been stable he may discontinue it. - Basic metabolic panel; Future  2. Diabetes mellitus without complication Continue weight loss - Hemoglobin A1c; Future - Basic metabolic panel; Future - metFORMIN (GLUCOPHAGE) 500 MG tablet; TAKE 1 TABLET BY MOUTH TWICE DAILY TO CONTROL DIABETES  Dispense: 180 tablet; Refill: 3  3. Hyperlipidemia Continue weight loss - Lipid panel; Future  4. Hypothyroidism, unspecified hypothyroidism type -  TSH; Future - levothyroxine (SYNTHROID, LEVOTHROID) 50 MCG tablet; Take one daily for thyroid supplement.  Dispense: 90 tablet; Refill: 3  5. Edema Improved  6. Obesity Improved  7. Sebaceous cyst Benign problem. He is not eager to have surgery on it.  8. Encounter for immunization Flu vaccine

## 2015-06-08 ENCOUNTER — Other Ambulatory Visit: Payer: Self-pay | Admitting: Internal Medicine

## 2015-06-10 DIAGNOSIS — H34832 Tributary (branch) retinal vein occlusion, left eye, with macular edema: Secondary | ICD-10-CM | POA: Diagnosis not present

## 2015-06-10 DIAGNOSIS — E113293 Type 2 diabetes mellitus with mild nonproliferative diabetic retinopathy without macular edema, bilateral: Secondary | ICD-10-CM | POA: Diagnosis not present

## 2015-06-24 ENCOUNTER — Ambulatory Visit: Payer: Medicare Other | Admitting: Internal Medicine

## 2015-06-30 ENCOUNTER — Ambulatory Visit (INDEPENDENT_AMBULATORY_CARE_PROVIDER_SITE_OTHER): Payer: Medicare Other | Admitting: Internal Medicine

## 2015-06-30 ENCOUNTER — Encounter: Payer: Self-pay | Admitting: Internal Medicine

## 2015-06-30 VITALS — BP 118/70 | HR 73 | Temp 97.5°F | Resp 20 | Ht 66.0 in | Wt 195.2 lb

## 2015-06-30 DIAGNOSIS — E785 Hyperlipidemia, unspecified: Secondary | ICD-10-CM | POA: Diagnosis not present

## 2015-06-30 DIAGNOSIS — L723 Sebaceous cyst: Secondary | ICD-10-CM | POA: Diagnosis not present

## 2015-06-30 DIAGNOSIS — E039 Hypothyroidism, unspecified: Secondary | ICD-10-CM | POA: Diagnosis not present

## 2015-06-30 DIAGNOSIS — I10 Essential (primary) hypertension: Secondary | ICD-10-CM

## 2015-06-30 DIAGNOSIS — M545 Low back pain, unspecified: Secondary | ICD-10-CM

## 2015-06-30 DIAGNOSIS — E119 Type 2 diabetes mellitus without complications: Secondary | ICD-10-CM

## 2015-06-30 DIAGNOSIS — C649 Malignant neoplasm of unspecified kidney, except renal pelvis: Secondary | ICD-10-CM | POA: Diagnosis not present

## 2015-06-30 DIAGNOSIS — E669 Obesity, unspecified: Secondary | ICD-10-CM | POA: Diagnosis not present

## 2015-06-30 DIAGNOSIS — R609 Edema, unspecified: Secondary | ICD-10-CM

## 2015-06-30 DIAGNOSIS — G8929 Other chronic pain: Secondary | ICD-10-CM | POA: Diagnosis not present

## 2015-06-30 DIAGNOSIS — R972 Elevated prostate specific antigen [PSA]: Secondary | ICD-10-CM | POA: Diagnosis not present

## 2015-06-30 NOTE — Progress Notes (Signed)
Patient ID: Nicholas Rivera, male   DOB: May 27, 1941, 74 y.o.   MRN: [id number]    Facility  [facility]     Place of Service:   OFFICE   No Known Allergies  Chief Complaint  Patient presents with  . Annual Exam  . Medical Management of Chronic Issues    HPI:  Diabetes mellitus without complication ([place]) - controlled  Essential hypertension - controlled  Cancer of kidney, unspecified laterality ([place]) - no evidence of relapse. He is followed regularly by his urologist, Dr. Alinda Money.  Obesity - patient has successfully lost a considerable amount of weight and maintained the weight loss.  Chronic midline low back pain without sciatica - still present at times, but generally improved  PSA elevation - improved  Edema, unspecified type - improved  Hyperlipidemia - controlled  Hypothyroidism, unspecified hypothyroidism type - compensated  Sebaceous cyst - nontender, benign, on posterior back    Medications: Patient's Medications  New Prescriptions   No medications on file  Previous Medications   ASPIRIN 81 MG TABLET    One daily to revent heart attack or stroke   LEVOTHYROXINE (SYNTHROID, LEVOTHROID) 50 MCG TABLET    Take one daily for thyroid supplement.   METFORMIN (GLUCOPHAGE) 500 MG TABLET    TAKE 1 TABLET BY MOUTH TWICE DAILY TO CONTROL DIABETES   MULTIPLE VITAMINS-MINERALS (MULTIVITAMIN WITH MINERALS) TABLET    Take 1 tablet by mouth daily. Take one tablet by mouth daily.   ONE TOUCH ULTRA TEST TEST STRIP    CHECK BLOOD SUGAR ONCE DAILY AS DIRECTED   ONETOUCH DELICA LANCETS FINE MISC    USE TO CHECK BLOOD SUGAR ONCE DAILY   VITAMIN C (ASCORBIC ACID) 500 MG TABLET    Take 500 mg by mouth daily. Take two tablets by mouth daily.  Modified Medications   No medications on file  Discontinued Medications   GLUCOSE BLOOD TEST STRIP    Use in One Touch Meter to check blood sugar once daily. Dx 250.00   LOSARTAN (COZAAR) 100 MG TABLET    TAKE 1 TABLET BY MOUTH DAILY TO CONTROL BLOOD  PRESSURE     Review of Systems  Constitutional:       Obese  HENT: Positive for hearing loss.   Eyes: Negative.   Respiratory: Positive for cough and chest tightness.   Cardiovascular: Negative for chest pain, palpitations and leg swelling.  Gastrointestinal: Negative for abdominal pain and abdominal distention.  Endocrine: Negative for cold intolerance, heat intolerance, polydipsia, polyphagia and polyuria.       Hx DM and hypothyroidism.  Genitourinary:       Hx Ca left tkidney and removal surgically. Hx BPH. Hx elevated PSA. Bx prostate in Sept 2016 by dr. Alinda Money,  Musculoskeletal: Positive for back pain.  Skin:       Nontender sebaceous cyst of upper back  Neurological: Negative.   Hematological: Negative.   Psychiatric/Behavioral: Negative.     Filed Vitals:   06/30/15 1436  BP: 118/70  Pulse: 73  Temp: 97.5 F (36.4 C)  TempSrc: Oral  Resp: 20  Height: $Remove'5\' 6"'gkSGlcc$  (1.676 m)  Weight: 195 lb 3.2 oz (88.542 kg)  SpO2: 95%   Body mass index is 31.52 kg/(m^2).  Physical Exam  Constitutional: He is oriented to person, place, and time. No distress.  Obese.  HENT:  Head: Normocephalic.  Right Ear: External ear normal.  Left Ear: External ear normal.  Eyes: Conjunctivae and EOM are normal. Pupils are equal, round,  and reactive to light.  Corrective lenses.  Neck: Normal range of motion. Neck supple. No JVD present. No tracheal deviation present. No thyromegaly present.  Cardiovascular: Normal rate, regular rhythm, normal heart sounds and intact distal pulses.  Exam reveals no gallop and no friction rub.   No murmur heard. Pulmonary/Chest: Effort normal and breath sounds normal. No respiratory distress. He has no wheezes. He has no rales. He exhibits no tenderness.  Abdominal: Soft. Bowel sounds are normal. He exhibits no distension and no mass. There is no tenderness.  Genitourinary:  By Dr.  Alinda Money  Musculoskeletal: Normal range of motion. He exhibits edema and  tenderness.  Lymphadenopathy:    He has no cervical adenopathy.  Neurological: He is alert and oriented to person, place, and time. No cranial nerve deficit. Coordination normal.  Skin: No rash noted. No erythema.  Nontender sebaceous cyst of the upper back.  Psychiatric: He has a normal mood and affect. His behavior is normal. Judgment and thought content normal.     Labs reviewed: Lab Summary Latest Ref Rng 05/08/2015 12/08/2014 08/25/2014 05/19/2014  Hemoglobin 13.0-17.0 g/dL (None) (None) (None) (None)  Hematocrit 39.0-52.0 % (None) (None) (None) (None)  White count - (None) (None) (None) (None)  Platelet count - (None) (None) (None) (None)  Sodium 134 - 144 mmol/L 139 137 139 140  Potassium 3.5 - 5.2 mmol/L 4.6 4.5 4.4 4.4  Calcium 8.6 - 10.2 mg/dL 9.3 9.4 9.7 9.9  Phosphorus - (None) (None) (None) (None)  Creatinine 0.76 - 1.27 mg/dL 1.11 1.12 0.99 1.02  AST 0 - 40 IU/L (None) (None) (None) 15  Alk Phos 39 - 117 IU/L (None) (None) (None) 98  Bilirubin 0.0 - 1.2 mg/dL (None) (None) (None) 0.3  Glucose 65 - 99 mg/dL 105(H) 108(H) 110(H) 104(H)  Cholesterol - (None) (None) (None) (None)  HDL cholesterol >39 mg/dL 42 40 39(L) 46  Triglycerides 0 - 149 mg/dL 164(H) 282(H) 345(H) 298(H)  LDL Direct - (None) (None) (None) (None)  LDL Calc 0 - 99 mg/dL 124(H) 107(H) 119(H) 142(H)  Total protein - (None) (None) (None) (None)  Albumin 3.5 - 4.8 g/dL (None) (None) (None) 4.3   Lab Results  Component Value Date   TSH 2.330 12/08/2014   Lab Results  Component Value Date   BUN 13 05/08/2015   Lab Results  Component Value Date   HGBA1C 6.0* 05/08/2015       Assessment/Plan  1. Diabetes mellitus without complication ([place]) - Hemoglobin A1c; Future - Comprehensive metabolic panel; Future  2. Essential hypertension - Comprehensive metabolic panel; Future  3. Cancer of kidney, unspecified laterality ([place]) No signs of relapse.  4. Obesity Has been successful losing weight  and maintaining the loss.  5. Chronic midline low back pain without sciatica Improved  6. PSA elevation Improved  7. Edema, unspecified type Improved  8. Hyperlipidemia - Lipid panel; Future  9. Hypothyroidism, unspecified hypothyroidism type - TSH; Future  10. Sebaceous cyst Benign. Unchanged. No further action needed at this time.

## 2015-07-01 DIAGNOSIS — H25813 Combined forms of age-related cataract, bilateral: Secondary | ICD-10-CM | POA: Diagnosis not present

## 2015-07-01 LAB — HM DIABETES EYE EXAM

## 2015-07-07 ENCOUNTER — Telehealth: Payer: Self-pay | Admitting: *Deleted

## 2015-07-07 DIAGNOSIS — R310 Gross hematuria: Secondary | ICD-10-CM | POA: Diagnosis not present

## 2015-07-07 NOTE — Telephone Encounter (Signed)
Patient wife, Harmon Pier called and stated that her husband is bleeding from the penis. They have an appointment today at his Urologist office with the NP but they want to see a Dr. Appointment was scheduled for tomorrow with Dr. And they will keep appointment also with the Urologist today.

## 2015-07-08 ENCOUNTER — Ambulatory Visit: Payer: Medicare Other | Admitting: Internal Medicine

## 2015-07-08 DIAGNOSIS — E669 Obesity, unspecified: Secondary | ICD-10-CM | POA: Diagnosis not present

## 2015-07-08 DIAGNOSIS — Y658 Other specified misadventures during surgical and medical care: Secondary | ICD-10-CM | POA: Insufficient documentation

## 2015-07-08 DIAGNOSIS — Z79899 Other long term (current) drug therapy: Secondary | ICD-10-CM | POA: Insufficient documentation

## 2015-07-08 DIAGNOSIS — I10 Essential (primary) hypertension: Secondary | ICD-10-CM | POA: Insufficient documentation

## 2015-07-08 DIAGNOSIS — Z85528 Personal history of other malignant neoplasm of kidney: Secondary | ICD-10-CM | POA: Diagnosis not present

## 2015-07-08 DIAGNOSIS — E039 Hypothyroidism, unspecified: Secondary | ICD-10-CM | POA: Insufficient documentation

## 2015-07-08 DIAGNOSIS — R319 Hematuria, unspecified: Secondary | ICD-10-CM | POA: Diagnosis present

## 2015-07-08 DIAGNOSIS — T83098A Other mechanical complication of other indwelling urethral catheter, initial encounter: Secondary | ICD-10-CM | POA: Diagnosis not present

## 2015-07-08 DIAGNOSIS — R310 Gross hematuria: Secondary | ICD-10-CM | POA: Diagnosis not present

## 2015-07-08 DIAGNOSIS — E119 Type 2 diabetes mellitus without complications: Secondary | ICD-10-CM | POA: Diagnosis not present

## 2015-07-08 DIAGNOSIS — H269 Unspecified cataract: Secondary | ICD-10-CM | POA: Insufficient documentation

## 2015-07-08 DIAGNOSIS — Z87442 Personal history of urinary calculi: Secondary | ICD-10-CM | POA: Diagnosis not present

## 2015-07-08 DIAGNOSIS — Z7982 Long term (current) use of aspirin: Secondary | ICD-10-CM | POA: Insufficient documentation

## 2015-07-09 ENCOUNTER — Encounter (HOSPITAL_COMMUNITY): Payer: Self-pay | Admitting: *Deleted

## 2015-07-09 ENCOUNTER — Emergency Department (HOSPITAL_COMMUNITY)
Admission: EM | Admit: 2015-07-09 | Discharge: 2015-07-09 | Disposition: A | Discharge status: Home / Self Care | Payer: Medicare Other | Attending: Emergency Medicine | Admitting: Emergency Medicine

## 2015-07-09 DIAGNOSIS — T839XXA Unspecified complication of genitourinary prosthetic device, implant and graft, initial encounter: Secondary | ICD-10-CM

## 2015-07-09 NOTE — ED Notes (Signed)
Leg bag exchanged; small amount of blood tinged urine noted to new leg bag

## 2015-07-09 NOTE — ED Provider Notes (Signed)
CSN: [id number]     Arrival date & time 07/08/15  2357 History  By signing my name below, I, Sonum Patel, attest that this documentation has been prepared under the direction and in the presence of Merryl Hacker, MD. Electronically Signed: Sonum Patel, Education administrator. 07/09/2015. 12:32 AM.    Chief Complaint  Patient presents with  . Hematuria   The history is provided by the patient. No language interpreter was used.     HPI Comments: Nicholas Rivera is a 74 y.o. male who presents to the Emergency Department complaining of a foley catheter problem that began PTA. Patient states he woke up to empty his leg bag but was unable to do this. His urologist placed the foley earlier in the day due to prostate bleeding. He reports noticing small blood clots in the leg bag today but denies any pain at this time. He denies associated symptoms or modifying factors.   Urologist Alinda Money   Past Medical History  Diagnosis Date  . Diabetes mellitus without complication ([place]) 6546  . Hypertension 2012  . Unspecified hypothyroidism 2012  . Nephrolithiasis 2012  . Cancer of kidney ([place]) 2012  . Obesity, unspecified   . Cataract 2013  . Other and unspecified hyperlipidemia 04/03/2013  . Back pain, lumbosacral 2014  . Pain, joint, ankle and foot    Past Surgical History  Procedure Laterality Date  . Kidney surgery  12/2010    had cancer removed; Dr. Alinda Money  . Rotator cuff repair      bilateral; Dr. [name]   No family history on file. Social History  Substance Use Topics  . Smoking status: Never Smoker   . Smokeless tobacco: None  . Alcohol Use: No    Review of Systems  Constitutional: Negative for fever.  Genitourinary: Positive for hematuria (blood-tinged uring in leg bag).       +foley catheter problem   All other systems reviewed and are negative.     Allergies  Review of patient's allergies indicates no known allergies.  Home Medications   Prior to Admission medications   Medication  Sig Start Date End Date Taking? Authorizing Provider  levothyroxine (SYNTHROID, LEVOTHROID) 50 MCG tablet Take one daily for thyroid supplement. Patient taking differently: Take 50 mcg by mouth daily before breakfast.  05/13/15  Yes Estill Dooms, MD  metFORMIN (GLUCOPHAGE) 500 MG tablet TAKE 1 TABLET BY MOUTH TWICE DAILY TO CONTROL DIABETES 05/13/15  Yes Estill Dooms, MD  aspirin 81 MG tablet One daily to revent heart attack or stroke Patient not taking: Reported on 07/09/2015 01/08/14   Estill Dooms, MD  ONE TOUCH ULTRA TEST test strip CHECK BLOOD SUGAR ONCE DAILY AS DIRECTED 04/09/15   Estill Dooms, MD  [facility] DELICA LANCETS FINE MISC USE TO CHECK BLOOD SUGAR ONCE DAILY 04/09/15   Estill Dooms, MD   BP 158/105 mmHg  Pulse 86  Temp(Src) 97.5 F (36.4 C) (Oral)  Resp 20  Ht 5\' 6"  (1.676 m)  Wt 195 lb 4 oz (88.565 kg)  BMI 31.53 kg/m2  SpO2 96% Physical Exam  Constitutional: He is oriented to person, place, and time. He appears well-developed and well-nourished.  HENT:  Head: Normocephalic and atraumatic.  Cardiovascular: Normal rate, regular rhythm and normal heart sounds.   No murmur heard. Pulmonary/Chest: Effort normal and breath sounds normal. No respiratory distress. He has no wheezes.  Abdominal: Soft. There is no tenderness.  Neurological: He is alert and oriented to person, place,  and time.  Skin: Skin is warm and dry.  Psychiatric: He has a normal mood and affect.  Nursing note and vitals reviewed.   ED Course  Procedures (including critical care time)  DIAGNOSTIC STUDIES: Oxygen Saturation is 96% on RA, adequate by my interpretation.    COORDINATION OF CARE: 12:36 AM Discussed treatment plan with pt at bedside and pt agreed to plan.   Labs Review Labs Reviewed - No data to display  Imaging Review No results found.    EKG Interpretation None      MDM   Final diagnoses:  Foley catheter problem, initial encounter [facility])    Patient presents with  difficulties draining his Foley catheter bag. His back was replaced and is now draining bloody urine. It appears he had blood clots in the bottom of his back. He has no other complaints. He was provided with a larger bag for nighttime.  Bladder scan shows 25 mL in the bladder and his actual Foley appears to be draining appropriately. Follow-up with urology.  After history, exam, and medical workup I feel the patient has been appropriately medically screened and is safe for discharge home. Pertinent diagnoses were discussed with the patient. Patient was given return precautions.  I personally performed the services described in this documentation, which was scribed in my presence. The recorded information has been reviewed and is accurate.   Merryl Hacker, MD 07/09/15 0111

## 2015-07-09 NOTE — ED Notes (Signed)
The pt just received a fokley catheter toiday and he has been ablke toi drain the bag all day.  He woke a few minutes ago and he was unable to draiun the urine from the bag.  The  Urine is bloody

## 2015-07-09 NOTE — ED Notes (Signed)
Standard drainage bag applied to catheter per MD Horton verbal order to aid in patients sleep routine

## 2015-07-09 NOTE — Discharge Instructions (Signed)
Foley Catheter Care, Adult °A Foley catheter is a soft, flexible tube that is placed into the bladder to drain urine. A Foley catheter may be inserted if: °· You leak urine or are not able to control when you urinate (urinary incontinence). °· You are not able to urinate when you need to (urinary retention). °· You had prostate surgery or surgery on the genitals. °· You have certain medical conditions, such as multiple sclerosis, dementia, or a spinal cord injury. °If you are going home with a Foley catheter in place, follow the instructions below. °TAKING CARE OF THE CATHETER °1. Wash your hands with soap and water. °2. Using mild soap and warm water on a clean washcloth: °¨ Clean the area on your body closest to the catheter insertion site using a circular motion, moving away from the catheter. Never wipe toward the catheter because this could sweep bacteria up into the urethra and cause infection. °¨ Remove all traces of soap. Pat the area dry with a clean towel. For males, reposition the foreskin. °3. Attach the catheter to your leg so there is no tension on the catheter. Use adhesive tape or a leg strap. If you are using adhesive tape, remove any sticky residue left behind by the previous tape you used. °4. Keep the drainage bag below the level of the bladder, but keep it off the floor. °5. Check throughout the day to be sure the catheter is working and urine is draining freely. Make sure the tubing does not become kinked. °6. Do not pull on the catheter or try to remove it. Pulling could damage internal tissues. °TAKING CARE OF THE DRAINAGE BAGS °You will be given two drainage bags to take home. One is a large overnight drainage bag, and the other is a smaller leg bag that fits underneath clothing. You may wear the overnight bag at any time, but you should never wear the smaller leg bag at night. Follow the instructions below for how to empty, change, and clean your drainage bags. °Emptying the Drainage  Bag °You must empty your drainage bag when it is  -½ full or at least 2-3 times a day. °1. Wash your hands with soap and water. °2. Keep the drainage bag below your hips, below the level of your bladder. This stops urine from going back into the tubing and into your bladder. °3. Hold the dirty bag over the toilet or a clean container. °4. Open the pour spout at the bottom of the bag and empty the urine into the toilet or container. Do not let the pour spout touch the toilet, container, or any other surface. Doing so can place bacteria on the bag, which can cause an infection. °5. Clean the pour spout with a gauze pad or cotton ball that has rubbing alcohol on it. °6. Close the pour spout. °7. Attach the bag to your leg with adhesive tape or a leg strap. °8. Wash your hands well. °Changing the Drainage Bag °Change your drainage bag once a month or sooner if it starts to smell bad or look dirty. Below are steps to follow when changing the drainage bag. °1. Wash your hands with soap and water. °2. Pinch off the rubber catheter so that urine does not spill out. °3. Disconnect the catheter tube from the drainage tube at the connection valve. Do not let the tubes touch any surface. °4. Clean the end of the catheter tube with an alcohol wipe. Use a different alcohol wipe to clean   the end of the drainage tube. °5. Connect the catheter tube to the drainage tube of the clean drainage bag. °6. Attach the new bag to the leg with adhesive tape or a leg strap. Avoid attaching the new bag too tightly. °7. Wash your hands well. °Cleaning the Drainage Bag °1. Wash your hands with soap and water. °2. Wash the bag in warm, soapy water. °3. Rinse the bag thoroughly with warm water. °4. Fill the bag with a solution of white vinegar and water (1 cup vinegar to 1 qt warm water [.2 L vinegar to 1 L warm water]). Close the bag and soak it for 30 minutes in the solution. °5. Rinse the bag with warm water. °6. Hang the bag to dry with the  pour spout open and hanging downward. °7. Store the clean bag (once it is dry) in a clean plastic bag. °8. Wash your hands well. °PREVENTING INFECTION °· Wash your hands before and after handling your catheter. °· Take showers daily and wash the area where the catheter enters your body. Do not take baths. Replace wet leg straps with dry ones, if this applies. °· Do not use powders, sprays, or lotions on the genital area. Only use creams, lotions, or ointments as directed by your caregiver. °· For females, wipe from front to back after each bowel movement. °· Drink enough fluids to keep your urine clear or pale yellow unless you have a fluid restriction. °· Do not let the drainage bag or tubing touch or lie on the floor. °· Wear cotton underwear to absorb moisture and to keep your skin drier. °SEEK MEDICAL CARE IF:  °· Your urine is cloudy or smells unusually bad. °· Your catheter becomes clogged. °· You are not draining urine into the bag or your bladder feels full. °· Your catheter starts to leak. °SEEK IMMEDIATE MEDICAL CARE IF:  °· You have pain, swelling, redness, or pus where the catheter enters the body. °· You have pain in the abdomen, legs, lower back, or bladder. °· You have a fever. °· You see blood fill the catheter, or your urine is pink or red. °· You have nausea, vomiting, or chills. °· Your catheter gets pulled out. °MAKE SURE YOU:  °· Understand these instructions. °· Will watch your condition. °· Will get help right away if you are not doing well or get worse. °  °This information is not intended to replace advice given to you by your health care provider. Make sure you discuss any questions you have with your health care provider. °  °Document Released: 08/22/2005 Document Revised: 01/06/2014 Document Reviewed: 08/13/2012 °Elsevier Interactive Patient Education ©2016 [employer] ° °

## 2015-07-15 DIAGNOSIS — R310 Gross hematuria: Secondary | ICD-10-CM | POA: Diagnosis not present

## 2015-07-15 DIAGNOSIS — N400 Enlarged prostate without lower urinary tract symptoms: Secondary | ICD-10-CM | POA: Diagnosis not present

## 2015-08-04 ENCOUNTER — Encounter: Payer: Self-pay | Admitting: Internal Medicine

## 2015-08-04 ENCOUNTER — Ambulatory Visit (INDEPENDENT_AMBULATORY_CARE_PROVIDER_SITE_OTHER): Payer: Medicare Other | Admitting: Internal Medicine

## 2015-08-04 VITALS — BP 108/68 | HR 67 | Temp 98.2°F | Resp 20 | Ht 66.0 in | Wt 187.4 lb

## 2015-08-04 DIAGNOSIS — E119 Type 2 diabetes mellitus without complications: Secondary | ICD-10-CM | POA: Diagnosis not present

## 2015-08-04 DIAGNOSIS — N3000 Acute cystitis without hematuria: Secondary | ICD-10-CM | POA: Diagnosis not present

## 2015-08-04 DIAGNOSIS — N390 Urinary tract infection, site not specified: Secondary | ICD-10-CM | POA: Insufficient documentation

## 2015-08-04 DIAGNOSIS — R21 Rash and other nonspecific skin eruption: Secondary | ICD-10-CM | POA: Diagnosis not present

## 2015-08-04 DIAGNOSIS — I10 Essential (primary) hypertension: Secondary | ICD-10-CM

## 2015-08-04 MED ORDER — TRIAMCINOLONE ACETONIDE 0.1 % EX CREA: TOPICAL_CREAM | CUTANEOUS | Status: DC

## 2015-08-04 MED ORDER — METFORMIN HCL 500 MG PO TABS: ORAL_TABLET | ORAL | Status: DC

## 2015-08-04 MED ORDER — CETIRIZINE HCL 10 MG PO TABS: ORAL_TABLET | ORAL | Status: DC

## 2015-08-04 NOTE — Progress Notes (Signed)
Patient ID: Nicholas Rivera, male   DOB: 20-May-1941, 74 y.o.   MRN: [id number]    Facility  [place] [facility]    Place of Service:   OFFICE    No Known Allergies  Chief Complaint  Patient presents with  . Acute Visit    Patient c/o Itchy rash started about 1 month ago    HPI:  Rash on the legs and lower back present for about a month. Was present prior to problem with his prostate. Has used coconut oil and vinegar.  Rough month. Prostate was bleeding. Went to the ER after 3 days. Has medication to shrink the prostate, but it makes him void every hour. On finasteride. History of enlarged prostate. Had cysto. Sees Dr. Alinda Money. Had to wear catheter for a while. Used antibiotic at the time the catheter was removed. Still has dysuria.  Medications: Patient's Medications  New Prescriptions   No medications on file  Previous Medications   ASPIRIN 81 MG TABLET    One daily to revent heart attack or stroke   FINASTERIDE (PROSCAR) 5 MG TABLET    Take 5 mg by mouth daily.   LEVOTHYROXINE (SYNTHROID, LEVOTHROID) 50 MCG TABLET    Take one daily for thyroid supplement.   METFORMIN (GLUCOPHAGE) 500 MG TABLET    TAKE 1 TABLET BY MOUTH TWICE DAILY TO CONTROL DIABETES   ONE TOUCH ULTRA TEST TEST STRIP    CHECK BLOOD SUGAR ONCE DAILY AS DIRECTED   ONETOUCH DELICA LANCETS FINE MISC    USE TO CHECK BLOOD SUGAR ONCE DAILY  Modified Medications   No medications on file  Discontinued Medications   No medications on file    Review of Systems  Constitutional:       Obese  HENT: Positive for hearing loss.   Eyes: Negative.   Respiratory: Positive for cough and chest tightness.   Cardiovascular: Negative for chest pain, palpitations and leg swelling.  Gastrointestinal: Negative for abdominal pain and abdominal distention.  Endocrine: Negative for cold intolerance, heat intolerance, polydipsia, polyphagia and polyuria.       Hx DM and hypothyroidism.  Genitourinary:       Hx Ca left tkidney and removal  surgically. Hx BPH. Hx elevated PSA. Bx prostate in Sept 2016 by dr. Alinda Money,  Musculoskeletal: Positive for back pain.  Skin: Positive for rash (pruritic since Oct 2016.).       Nontender sebaceous cyst of upper back  Neurological: Negative.   Hematological: Negative.   Psychiatric/Behavioral: Negative.     Filed Vitals:   08/04/15 1611  BP: 108/68  Pulse: 67  Temp: 98.2 F (36.8 C)  TempSrc: Oral  Resp: 20  Height: _0  (1.676 m)  Weight: 187 lb 6.4 oz (85.004 kg)  SpO2: 97%   Body mass index is 30.26 kg/(m^2).  Physical Exam  Constitutional: He is oriented to person, place, and time. No distress.  Obese.  HENT:  Head: Normocephalic.  Right Ear: External ear normal.  Left Ear: External ear normal.  Eyes: Conjunctivae and EOM are normal. Pupils are equal, round, and reactive to light.  Corrective lenses.  Neck: Normal range of motion. Neck supple. No JVD present. No tracheal deviation present. No thyromegaly present.  Cardiovascular: Normal rate, regular rhythm, normal heart sounds and intact distal pulses.  Exam reveals no gallop and no friction rub.   No murmur heard. Pulmonary/Chest: Effort normal and breath sounds normal. No respiratory distress. He has no wheezes. He has no rales. He exhibits no  tenderness.  Abdominal: Soft. Bowel sounds are normal. He exhibits no distension and no mass. There is no tenderness.  Genitourinary:  By Dr.  Alinda Money  Musculoskeletal: Normal range of motion. He exhibits edema and tenderness.  Lymphadenopathy:    He has no cervical adenopathy.  Neurological: He is alert and oriented to person, place, and time. No cranial nerve deficit. Coordination normal.  Skin: No rash noted. No erythema.  Nontender sebaceous cyst of the upper back. Folliculitis of both thighs anteriorly.  Psychiatric: He has a normal mood and affect. His behavior is normal. Judgment and thought content normal.    Labs reviewed: Lab Summary Latest Ref Rng 05/08/2015  12/08/2014 08/25/2014 05/19/2014  Hemoglobin 13.0-17.0 g/dL (None) (None) (None) (None)  Hematocrit 39.0-52.0 % (None) (None) (None) (None)  White count - (None) (None) (None) (None)  Platelet count - (None) (None) (None) (None)  Sodium 134 - 144 mmol/L 139 137 139 140  Potassium 3.5 - 5.2 mmol/L 4.6 4.5 4.4 4.4  Calcium 8.6 - 10.2 mg/dL 9.3 9.4 9.7 9.9  Phosphorus - (None) (None) (None) (None)  Creatinine 0.76 - 1.27 mg/dL 1.11 1.12 0.99 1.02  AST 0 - 40 IU/L (None) (None) (None) 15  Alk Phos 39 - 117 IU/L (None) (None) (None) 98  Bilirubin 0.0 - 1.2 mg/dL (None) (None) (None) 0.3  Glucose 65 - 99 mg/dL 105(H) 108(H) 110(H) 104(H)  Cholesterol - (None) (None) (None) (None)  HDL cholesterol >39 mg/dL 42 40 39(L) 46  Triglycerides 0 - 149 mg/dL 164(H) 282(H) 345(H) 298(H)  LDL Direct - (None) (None) (None) (None)  LDL Calc 0 - 99 mg/dL 124(H) 107(H) 119(H) 142(H)  Total protein - (None) (None) (None) (None)  Albumin 3.5 - 4.8 g/dL (None) (None) (None) 4.3   Lab Results  Component Value Date   TSH 2.330 12/08/2014   Lab Results  Component Value Date   BUN 13 05/08/2015   Lab Results  Component Value Date   HGBA1C 6.0* 05/08/2015    Assessment/Plan  1. Rash and nonspecific skin eruption - Comprehensive metabolic panel; Future - triamcinolone cream (KENALOG) 0.1 %; Apply sparingly up to twice daily to rash until it is resolved  Dispense: 30 g; Refill: 4 - cetirizine (ZYRTEC) 10 MG tablet; One daily to help control rash and itching  Dispense: 30 tablet; Refill: 11  2. Acute cystitis without hematuria - Urinalysis - Urine culture - CBC With Differential; Future  3. Essential hypertension Controlled  4. Diabetes mellitus without complication (HCC) Controlled - metFORMIN (GLUCOPHAGE) 500 MG tablet; TAKE 1 TABLET BY MOUTH TWICE DAILY TO CONTROL DIABETES  Dispense: 180 tablet; Refill: 3

## 2015-08-05 LAB — CBC WITH DIFFERENTIAL
BASOS ABS: 0.1 10*3/uL (ref 0.0–0.2)
BASOS: 1 %
EOS (ABSOLUTE): 0.4 10*3/uL (ref 0.0–0.4)
Eos: 4 %
Hematocrit: 41 % (ref 37.5–51.0)
Hemoglobin: 13.6 g/dL (ref 12.6–17.7)
Immature Grans (Abs): 0 10*3/uL (ref 0.0–0.1)
Immature Granulocytes: 0 %
LYMPHS ABS: 2.6 10*3/uL (ref 0.7–3.1)
Lymphs: 25 %
MCH: 28.7 pg (ref 26.6–33.0)
MCHC: 33.2 g/dL (ref 31.5–35.7)
MCV: 87 fL (ref 79–97)
Monocytes Absolute: 1 10*3/uL — ABNORMAL HIGH (ref 0.1–0.9)
Monocytes: 10 %
NEUTROS ABS: 6.1 10*3/uL (ref 1.4–7.0)
NEUTROS PCT: 60 %
RBC: 4.74 x10E6/uL (ref 4.14–5.80)
RDW: 14.1 % (ref 12.3–15.4)
WBC: 10.1 10*3/uL (ref 3.4–10.8)

## 2015-08-05 LAB — URINALYSIS
Bilirubin, UA: NEGATIVE
GLUCOSE, UA: NEGATIVE
KETONES UA: NEGATIVE
Nitrite, UA: NEGATIVE
Specific Gravity, UA: 1.006 (ref 1.005–1.030)
Urobilinogen, Ur: 0.2 mg/dL (ref 0.2–1.0)
pH, UA: 5.5 (ref 5.0–7.5)

## 2015-08-05 LAB — COMPREHENSIVE METABOLIC PANEL
ALK PHOS: 95 IU/L (ref 39–117)
ALT: 11 IU/L (ref 0–44)
AST: 13 IU/L (ref 0–40)
Albumin/Globulin Ratio: 1.8 (ref 1.1–2.5)
Albumin: 4.1 g/dL (ref 3.5–4.8)
BILIRUBIN TOTAL: 0.3 mg/dL (ref 0.0–1.2)
BUN / CREAT RATIO: 14 (ref 10–22)
BUN: 12 mg/dL (ref 8–27)
CHLORIDE: 101 mmol/L (ref 97–106)
CO2: 24 mmol/L (ref 18–29)
Calcium: 9.8 mg/dL (ref 8.6–10.2)
Creatinine, Ser: 0.88 mg/dL (ref 0.76–1.27)
GFR calc Af Amer: 99 mL/min/{1.73_m2} (ref >59)
GFR calc non Af Amer: 86 mL/min/{1.73_m2} (ref >59)
Globulin, Total: 2.3 g/dL (ref 1.5–4.5)
Glucose: 95 mg/dL (ref 65–99)
POTASSIUM: 4.9 mmol/L (ref 3.5–5.2)
Sodium: 140 mmol/L (ref 136–144)
Total Protein: 6.4 g/dL (ref 6.0–8.5)

## 2015-08-06 ENCOUNTER — Encounter: Payer: Self-pay | Admitting: Internal Medicine

## 2015-08-10 ENCOUNTER — Telehealth: Payer: Self-pay | Admitting: *Deleted

## 2015-08-10 LAB — URINE CULTURE

## 2015-08-10 MED ORDER — LEVOFLOXACIN 250 MG PO TABS: ORAL_TABLET | ORAL | Status: DC

## 2015-08-10 NOTE — Telephone Encounter (Signed)
-----   Message from Estill Dooms, MD sent at 08/10/2015  1:51 PM EST ----- Regarding: UTI Please call patient. UTI was confirmed.  Call Prescription for Levaquin 250 mg (10 tablets] to be taken 1 daily for infection

## 2015-08-10 NOTE — Telephone Encounter (Signed)
Patient notified and agreed. Faxed Rx to Pharmacy.    

## 2015-09-14 ENCOUNTER — Other Ambulatory Visit: Payer: Medicare Other

## 2015-09-16 ENCOUNTER — Ambulatory Visit: Payer: Medicare Other | Admitting: Internal Medicine

## 2015-09-18 ENCOUNTER — Other Ambulatory Visit: Payer: Medicare Other

## 2015-09-18 DIAGNOSIS — E039 Hypothyroidism, unspecified: Secondary | ICD-10-CM

## 2015-09-18 DIAGNOSIS — I10 Essential (primary) hypertension: Secondary | ICD-10-CM | POA: Diagnosis not present

## 2015-09-18 DIAGNOSIS — E785 Hyperlipidemia, unspecified: Secondary | ICD-10-CM | POA: Diagnosis not present

## 2015-09-18 DIAGNOSIS — E119 Type 2 diabetes mellitus without complications: Secondary | ICD-10-CM | POA: Diagnosis not present

## 2015-09-19 LAB — COMPREHENSIVE METABOLIC PANEL
ALT: 11 IU/L (ref 0–44)
AST: 16 IU/L (ref 0–40)
Albumin/Globulin Ratio: 1.8 (ref 1.1–2.5)
Albumin: 3.9 g/dL (ref 3.5–4.8)
Alkaline Phosphatase: 93 IU/L (ref 39–117)
BILIRUBIN TOTAL: 0.3 mg/dL (ref 0.0–1.2)
BUN/Creatinine Ratio: 14 (ref 10–22)
BUN: 13 mg/dL (ref 8–27)
CHLORIDE: 102 mmol/L (ref 96–106)
CO2: 22 mmol/L (ref 18–29)
Calcium: 9.3 mg/dL (ref 8.6–10.2)
Creatinine, Ser: 0.9 mg/dL (ref 0.76–1.27)
GFR calc Af Amer: 98 mL/min/{1.73_m2} (ref >59)
GFR calc non Af Amer: 85 mL/min/{1.73_m2} (ref >59)
GLUCOSE: 91 mg/dL (ref 65–99)
Globulin, Total: 2.2 g/dL (ref 1.5–4.5)
Potassium: 4.2 mmol/L (ref 3.5–5.2)
Sodium: 140 mmol/L (ref 134–144)
TOTAL PROTEIN: 6.1 g/dL (ref 6.0–8.5)

## 2015-09-19 LAB — HEMOGLOBIN A1C
ESTIMATED AVERAGE GLUCOSE: 117 mg/dL
Hgb A1c MFr Bld: 5.7 % — ABNORMAL HIGH (ref 4.8–5.6)

## 2015-09-19 LAB — TSH: TSH: 2.46 u[IU]/mL (ref 0.450–4.500)

## 2015-09-19 LAB — LIPID PANEL
CHOLESTEROL TOTAL: 165 mg/dL (ref 100–199)
Chol/HDL Ratio: 3.5 ratio units (ref 0.0–5.0)
HDL: 47 mg/dL (ref >39)
LDL Calculated: 97 mg/dL (ref 0–99)
Triglycerides: 104 mg/dL (ref 0–149)
VLDL Cholesterol Cal: 21 mg/dL (ref 5–40)

## 2015-09-30 ENCOUNTER — Encounter: Payer: Self-pay | Admitting: Internal Medicine

## 2015-09-30 ENCOUNTER — Ambulatory Visit (INDEPENDENT_AMBULATORY_CARE_PROVIDER_SITE_OTHER): Payer: Medicare Other | Admitting: Internal Medicine

## 2015-09-30 VITALS — BP 110/62 | HR 60 | Temp 98.1°F | Ht 66.0 in | Wt 189.0 lb

## 2015-09-30 DIAGNOSIS — E785 Hyperlipidemia, unspecified: Secondary | ICD-10-CM | POA: Diagnosis not present

## 2015-09-30 DIAGNOSIS — R609 Edema, unspecified: Secondary | ICD-10-CM | POA: Diagnosis not present

## 2015-09-30 DIAGNOSIS — E119 Type 2 diabetes mellitus without complications: Secondary | ICD-10-CM

## 2015-09-30 DIAGNOSIS — E669 Obesity, unspecified: Secondary | ICD-10-CM

## 2015-09-30 DIAGNOSIS — I10 Essential (primary) hypertension: Secondary | ICD-10-CM | POA: Diagnosis not present

## 2015-09-30 DIAGNOSIS — E039 Hypothyroidism, unspecified: Secondary | ICD-10-CM

## 2015-09-30 NOTE — Progress Notes (Signed)
Patient ID: Nicholas Rivera, male   DOB: 02/09/1941, 75 y.o.   MRN: [id number]    Facility  [place]    Place of Service:   OFFICE    No Known Allergies  Chief Complaint  Patient presents with  . Medical Management of Chronic Issues    4 month follow-up, discuss labs (copy printed)     HPI:  Essential hypertension - controlled. Patient has lost a lot of weight and is currently off medications.  Diabetes mellitus without complication (HCC) - steady improvement in A1c. Related weight loss. Continues on metformin. -   Hypothyroidism, unspecified hypothyroidism type - compensated   Obesity - continues to lose weight. He was down to 180 pounds but regained some weight over the holidays. He considers his ideal weight about 155 pounds .  Edema, unspecified type - resolved   Hyperlipidemia - Controlled    Medications: Patient's Medications  New Prescriptions   No medications on file  Previous Medications   ASPIRIN 81 MG TABLET    One daily to revent heart attack or stroke   LEVOTHYROXINE (SYNTHROID, LEVOTHROID) 50 MCG TABLET    Take one daily for thyroid supplement.   METFORMIN (GLUCOPHAGE) 500 MG TABLET    TAKE 1 TABLET BY MOUTH TWICE DAILY TO CONTROL DIABETES   ONE TOUCH ULTRA TEST TEST STRIP    CHECK BLOOD SUGAR ONCE DAILY AS DIRECTED   ONETOUCH DELICA LANCETS FINE MISC    USE TO CHECK BLOOD SUGAR ONCE DAILY   TRIAMCINOLONE CREAM (KENALOG) 0.1 %    Apply sparingly up to twice daily to rash until it is resolved  Modified Medications   No medications on file  Discontinued Medications   CETIRIZINE (ZYRTEC) 10 MG TABLET    One daily to help control rash and itching   FINASTERIDE (PROSCAR) 5 MG TABLET    Take 5 mg by mouth daily.   LEVOFLOXACIN (LEVAQUIN) 250 MG TABLET    Take one tablet by mouth once daily for infection    Review of Systems  Constitutional:       Obese  HENT: Positive for hearing loss.   Eyes: Negative.   Respiratory: Negative for cough, chest tightness and  wheezing.   Cardiovascular: Negative for chest pain, palpitations and leg swelling.  Gastrointestinal: Negative for abdominal pain and abdominal distention.  Endocrine: Negative for cold intolerance, heat intolerance, polydipsia, polyphagia and polyuria.       Hx DM and hypothyroidism.  Genitourinary:       Hx Ca left kidney and removal surgically. Hx BPH. Hx elevated PSA. Bx prostate in Sept 2016 by Dr. Alinda Money,  Musculoskeletal: Positive for back pain.  Skin: Negative for rash.       Nontender sebaceous cyst of upper back  Neurological: Negative.   Hematological: Negative.   Psychiatric/Behavioral: Negative.     Filed Vitals:   09/30/15 1531  BP: 110/62  Pulse: 60  Temp: 98.1 F (36.7 C)  TempSrc: Oral  Height: '5\' 6"'$  (1.676 m)  Weight: 189 lb (85.73 kg)  SpO2: 97%   Body mass index is 30.52 kg/(m^2). Filed Weights   09/30/15 1531  Weight: 189 lb (85.73 kg)     Physical Exam  Constitutional: He is oriented to person, place, and time. No distress.  Obese.  HENT:  Head: Normocephalic.  Right Ear: External ear normal.  Left Ear: External ear normal.  Eyes: Conjunctivae and EOM are normal. Pupils are equal, round, and reactive to light.  Corrective lenses.  Neck: Normal range of motion. Neck supple. No JVD present. No tracheal deviation present. No thyromegaly present.  Cardiovascular: Normal rate, regular rhythm, normal heart sounds and intact distal pulses.  Exam reveals no gallop and no friction rub.   No murmur heard. Pulmonary/Chest: Effort normal and breath sounds normal. No respiratory distress. He has no wheezes. He has no rales. He exhibits no tenderness.  Abdominal: Soft. Bowel sounds are normal. He exhibits no distension and no mass. There is no tenderness.  Genitourinary:  By Dr.  Alinda Money  Musculoskeletal: Normal range of motion. He exhibits edema and tenderness.  Lymphadenopathy:    He has no cervical adenopathy.  Neurological: He is alert and oriented to  person, place, and time. No cranial nerve deficit. Coordination normal.  Skin: No rash noted. No erythema.  Nontender sebaceous cyst of the upper back.  Psychiatric: He has a normal mood and affect. His behavior is normal. Judgment and thought content normal.    Labs reviewed: Lab Summary Latest Ref Rng 09/18/2015 08/04/2015 05/08/2015  Hemoglobin 12.6 - 17.7 g/dL (None) 13.6 (None)  Hematocrit 37.5 - 51.0 % (None) 41.0 (None)  White count 3.4 - 10.8 x10E3/uL (None) 10.1 (None)  Platelet count - (None) (None) (None)  Sodium 134 - 144 mmol/L 140 140 139  Potassium 3.5 - 5.2 mmol/L 4.2 4.9 4.6  Calcium 8.6 - 10.2 mg/dL 9.3 9.8 9.3  Phosphorus - (None) (None) (None)  Creatinine 0.76 - 1.27 mg/dL 0.90 0.88 1.11  AST 0 - 40 IU/L 16 13 (None)  Alk Phos 39 - 117 IU/L 93 95 (None)  Bilirubin 0.0 - 1.2 mg/dL 0.3 0.3 (None)  Glucose 65 - 99 mg/dL 91 95 105(H)  Cholesterol - (None) (None) (None)  HDL cholesterol >39 mg/dL 47 (None) 42  Triglycerides 0 - 149 mg/dL 104 (None) 164(H)  LDL Direct - (None) (None) (None)  LDL Calc 0 - 99 mg/dL 97 (None) 124(H)  Total protein - (None) (None) (None)  Albumin 3.5 - 4.8 g/dL 3.9 4.1 (None)   Lab Results  Component Value Date   TSH 2.460 09/18/2015   TSH 2.330 12/08/2014   TSH 2.770 08/25/2014   Lab Results  Component Value Date   BUN 13 09/18/2015   BUN 12 08/04/2015   BUN 13 05/08/2015   Lab Results  Component Value Date   HGBA1C 5.7* 09/18/2015   HGBA1C 6.0* 05/08/2015   HGBA1C 6.1* 12/08/2014    Assessment/Plan  1. Essential hypertension - Basic metabolic panel; Future  2. Diabetes mellitus without complication ([place]) - Hemoglobin A1c; Future - Microalbumin, urine; Future - Basic metabolic panel; Future  3. Hypothyroidism, unspecified hypothyroidism type - TSH; Future  4. Obesity Continue weight loss  5. Edema, unspecified type Resolved  6. Hyperlipidemia - Lipid panel; Future

## 2015-10-06 DIAGNOSIS — R972 Elevated prostate specific antigen [PSA]: Secondary | ICD-10-CM | POA: Diagnosis not present

## 2015-10-13 DIAGNOSIS — Z0000 Encounter for general adult medical examination without abnormal findings: Secondary | ICD-10-CM | POA: Diagnosis not present

## 2015-10-13 DIAGNOSIS — R3916 Straining to void: Secondary | ICD-10-CM | POA: Diagnosis not present

## 2015-10-13 DIAGNOSIS — R972 Elevated prostate specific antigen [PSA]: Secondary | ICD-10-CM | POA: Diagnosis not present

## 2015-10-13 DIAGNOSIS — R310 Gross hematuria: Secondary | ICD-10-CM | POA: Diagnosis not present

## 2015-10-13 DIAGNOSIS — N401 Enlarged prostate with lower urinary tract symptoms: Secondary | ICD-10-CM | POA: Diagnosis not present

## 2015-10-13 DIAGNOSIS — N200 Calculus of kidney: Secondary | ICD-10-CM | POA: Diagnosis not present

## 2015-12-31 DIAGNOSIS — E119 Type 2 diabetes mellitus without complications: Secondary | ICD-10-CM | POA: Diagnosis not present

## 2015-12-31 DIAGNOSIS — H25813 Combined forms of age-related cataract, bilateral: Secondary | ICD-10-CM | POA: Diagnosis not present

## 2015-12-31 LAB — HM DIABETES EYE EXAM

## 2016-01-18 DIAGNOSIS — H2512 Age-related nuclear cataract, left eye: Secondary | ICD-10-CM | POA: Diagnosis not present

## 2016-02-08 DIAGNOSIS — H2512 Age-related nuclear cataract, left eye: Secondary | ICD-10-CM | POA: Diagnosis not present

## 2016-02-08 DIAGNOSIS — H25812 Combined forms of age-related cataract, left eye: Secondary | ICD-10-CM | POA: Diagnosis not present

## 2016-02-14 IMAGING — CR DG CHEST 2V
2 series · 2 of 2 positions shown · non-contrast
Comparison: January 18, 2013

CLINICAL DATA: Renal carcinoma

EXAM:
CHEST  2 VIEW

[w chest pa]
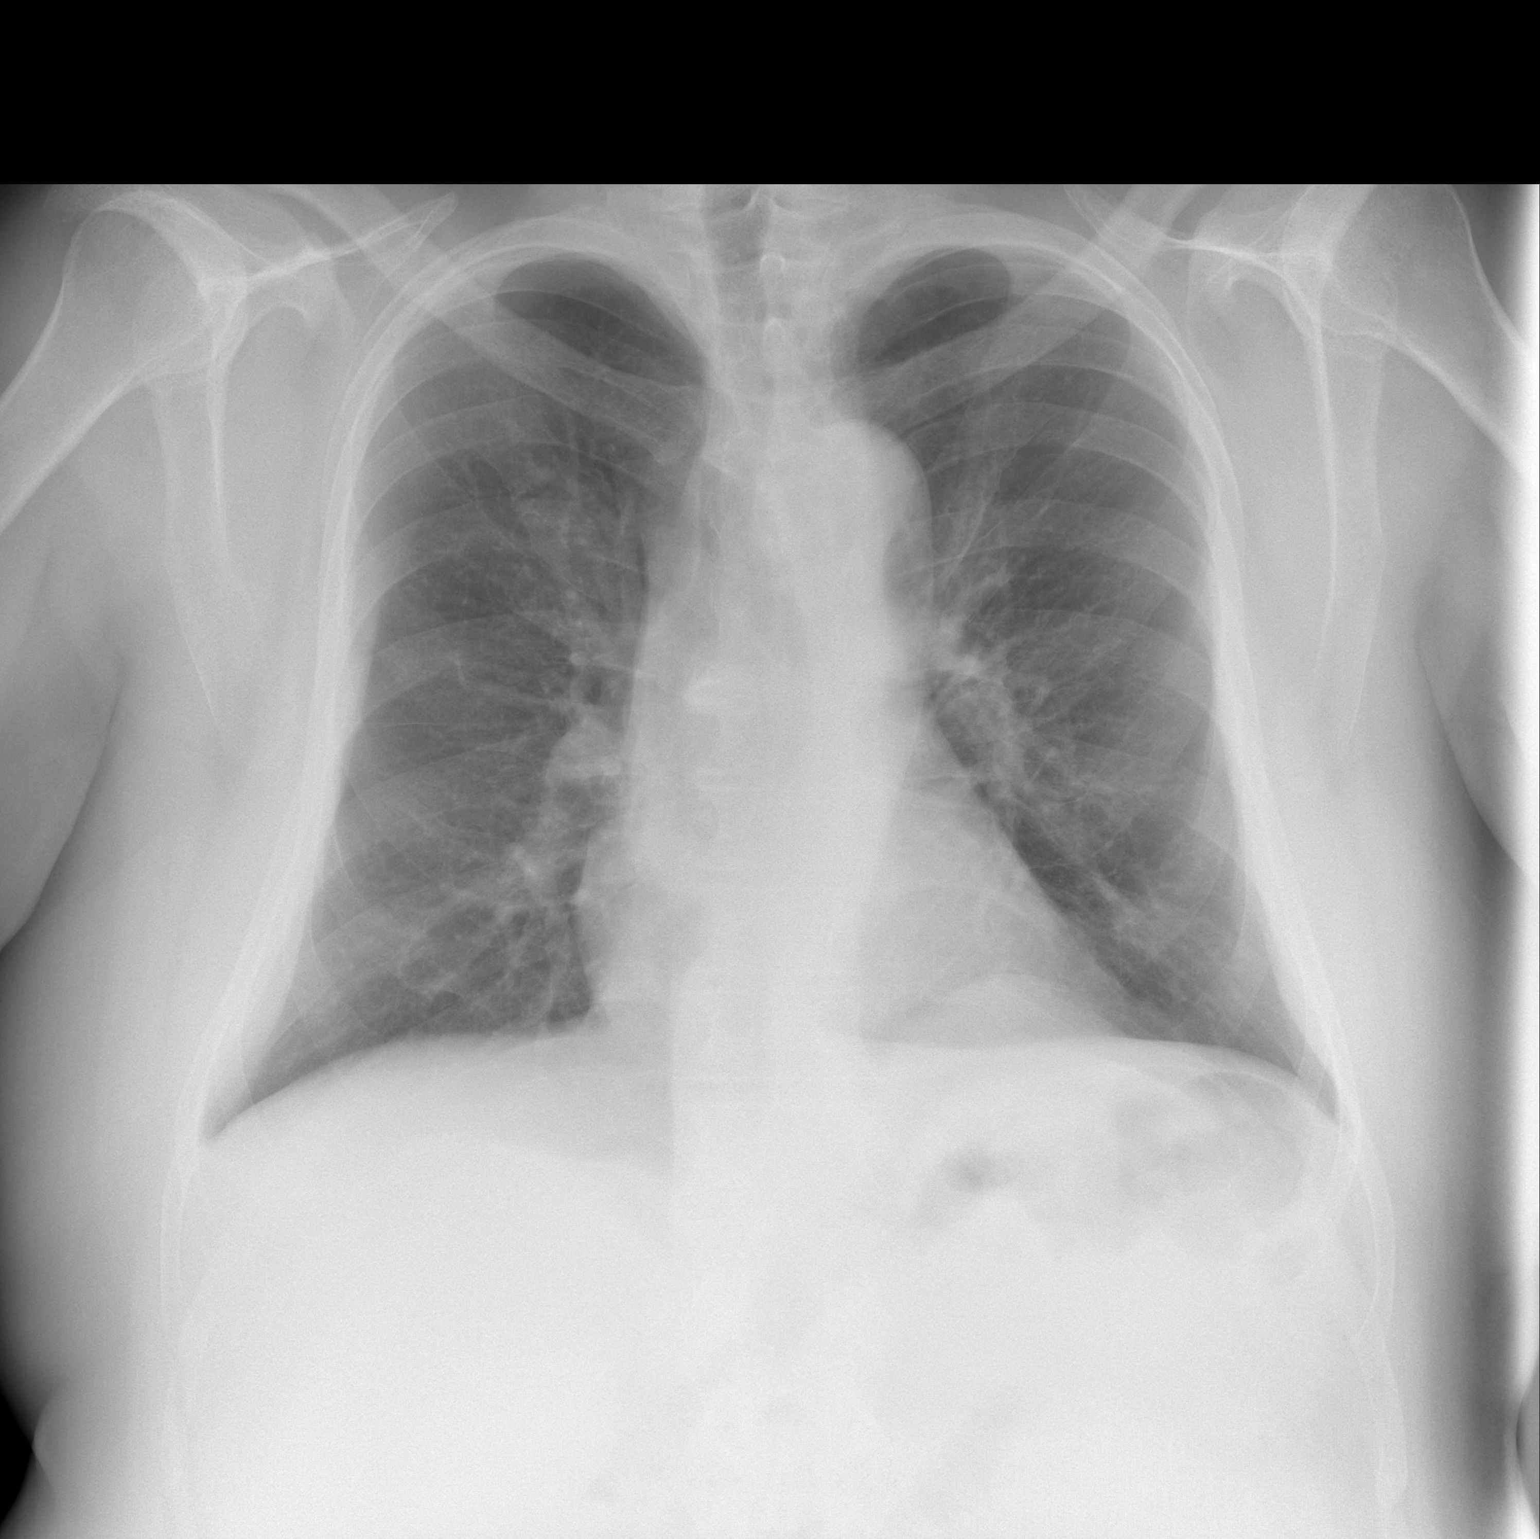

[w chest lat]
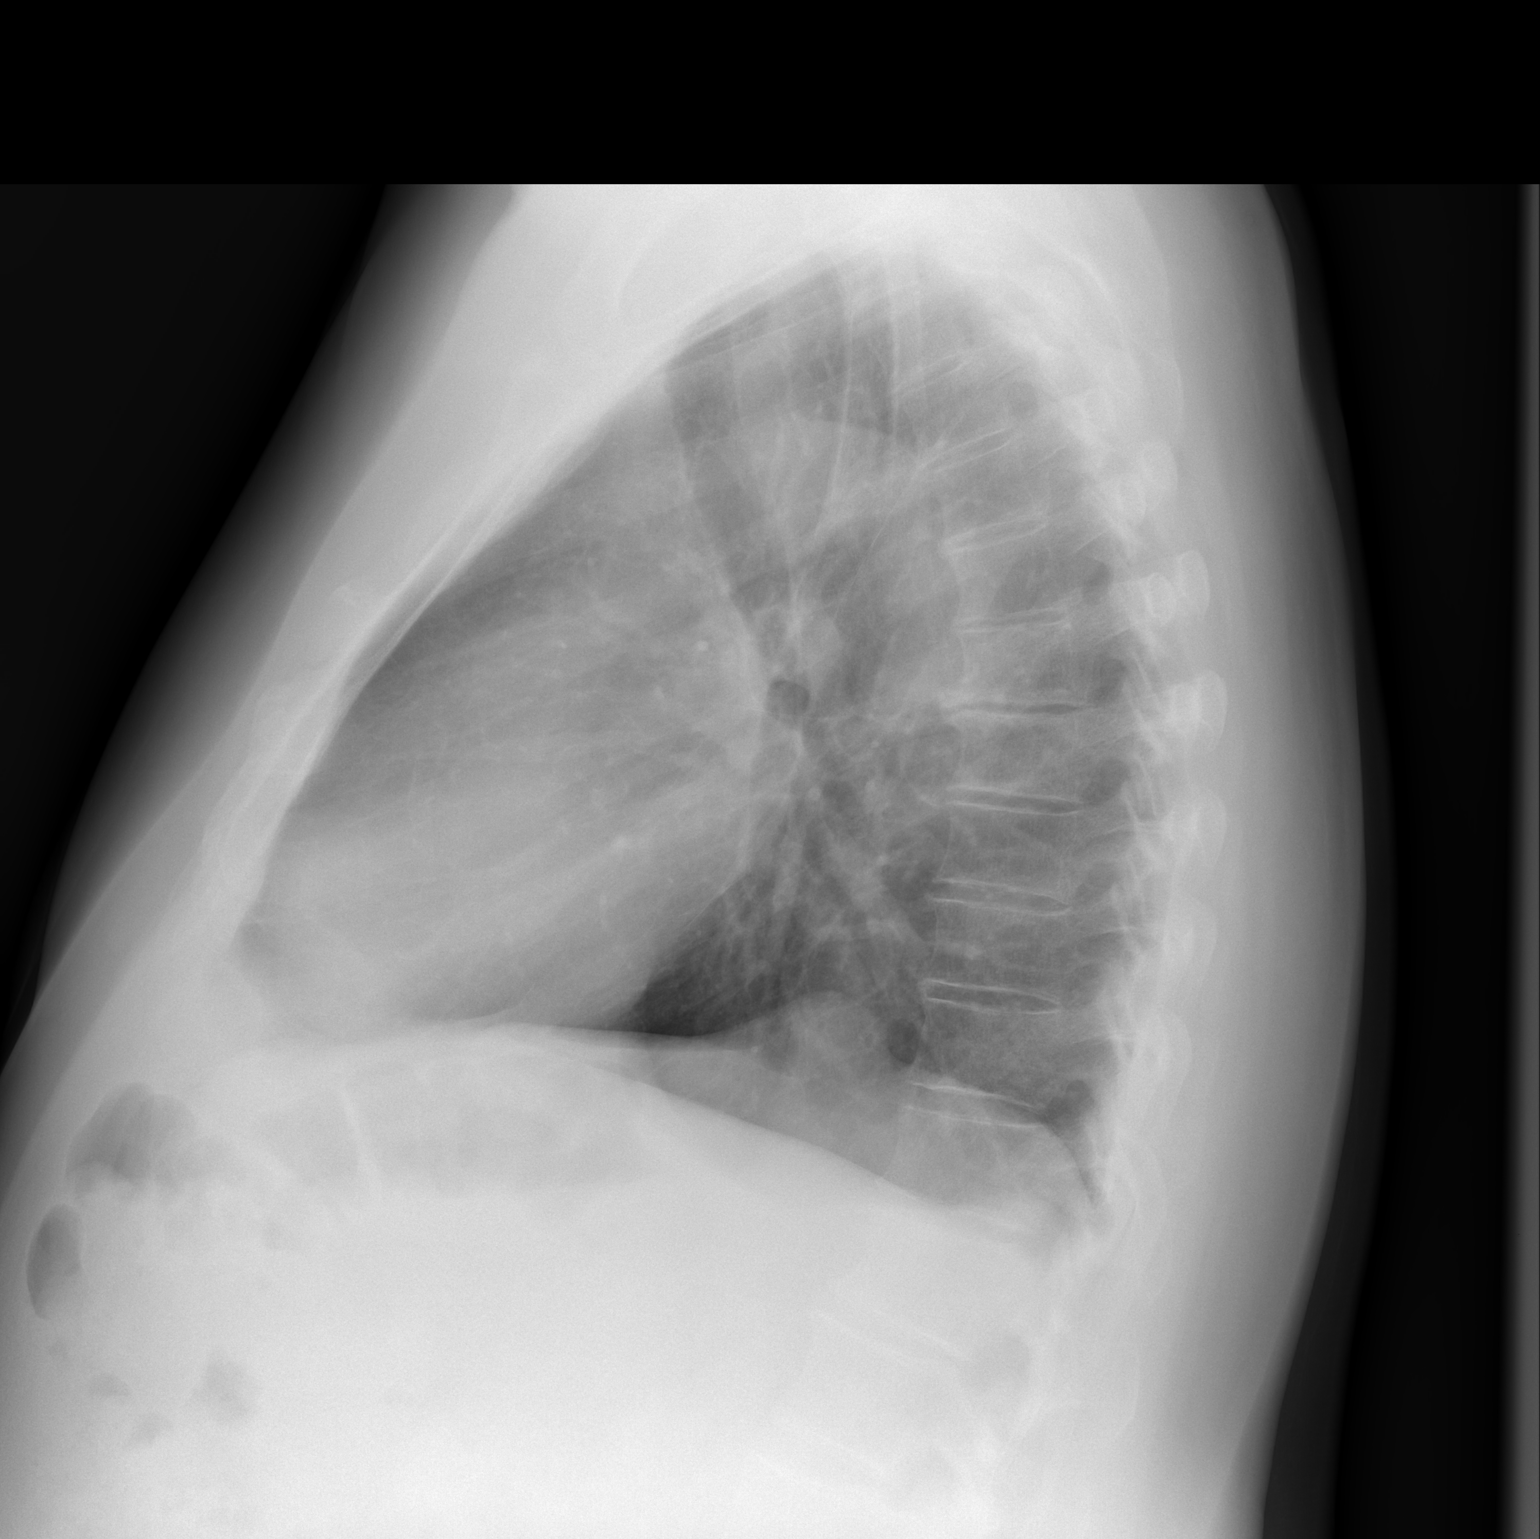

[2 of 2 positions shown; findings below may reference images not displayed]

FINDINGS: There is underlying emphysematous change. There is a small foramen
of Bochdalek hernia, stable. There is no edema or consolidation.
Heart size and pulmonary vascularity are normal. No adenopathy. No
blastic or lytic bone lesions. There is mild degenerative change in
the thoracic spine.
IMPRESSION: No appreciable neoplastic focus. Underlying emphysema. No edema or
consolidation.

## 2016-02-16 DIAGNOSIS — H2511 Age-related nuclear cataract, right eye: Secondary | ICD-10-CM | POA: Diagnosis not present

## 2016-02-29 DIAGNOSIS — H2512 Age-related nuclear cataract, left eye: Secondary | ICD-10-CM | POA: Diagnosis not present

## 2016-02-29 DIAGNOSIS — H25811 Combined forms of age-related cataract, right eye: Secondary | ICD-10-CM | POA: Diagnosis not present

## 2016-02-29 DIAGNOSIS — H2511 Age-related nuclear cataract, right eye: Secondary | ICD-10-CM | POA: Diagnosis not present

## 2016-03-05 HISTORY — PX: CATARACT EXTRACTION W/ INTRAOCULAR LENS IMPLANT: SHX1309

## 2016-03-29 NOTE — Addendum Note (Signed)
Addended by: Moshe Cipro MESHELL A on: 03/29/2016 03:39 PM   Modules accepted: Orders

## 2016-03-29 NOTE — Addendum Note (Signed)
Addended by: Moshe Cipro Tristan Bramble A on: 03/29/2016 03:39 PM   Modules accepted: Orders

## 2016-03-29 NOTE — Addendum Note (Signed)
Addended by: Moshe Cipro Racquelle Hyser A on: 03/29/2016 03:39 PM   Modules accepted: Orders

## 2016-04-01 ENCOUNTER — Other Ambulatory Visit: Payer: Medicare Other

## 2016-04-01 DIAGNOSIS — E039 Hypothyroidism, unspecified: Secondary | ICD-10-CM

## 2016-04-01 DIAGNOSIS — E785 Hyperlipidemia, unspecified: Secondary | ICD-10-CM | POA: Diagnosis not present

## 2016-04-01 DIAGNOSIS — E119 Type 2 diabetes mellitus without complications: Secondary | ICD-10-CM

## 2016-04-01 DIAGNOSIS — I10 Essential (primary) hypertension: Secondary | ICD-10-CM | POA: Diagnosis not present

## 2016-04-01 LAB — BASIC METABOLIC PANEL
BUN: 16 mg/dL (ref 7–25)
CALCIUM: 9.3 mg/dL (ref 8.6–10.3)
CO2: 25 mmol/L (ref 20–31)
Chloride: 104 mmol/L (ref 98–110)
Creat: 1.05 mg/dL (ref 0.70–1.18)
GLUCOSE: 95 mg/dL (ref 65–99)
POTASSIUM: 4.7 mmol/L (ref 3.5–5.3)
SODIUM: 139 mmol/L (ref 135–146)

## 2016-04-01 LAB — LIPID PANEL
CHOLESTEROL: 216 mg/dL — AB (ref 125–200)
HDL: 58 mg/dL (ref >=40)
LDL Cholesterol: 128 mg/dL (ref <130)
TRIGLYCERIDES: 151 mg/dL — AB (ref <150)
Total CHOL/HDL Ratio: 3.7 Ratio (ref <=5.0)
VLDL: 30 mg/dL (ref <30)

## 2016-04-01 LAB — TSH: TSH: 2.99 mIU/L (ref 0.40–4.50)

## 2016-04-01 LAB — HEMOGLOBIN A1C
Hgb A1c MFr Bld: 5.7 % — ABNORMAL HIGH (ref <5.7)
Mean Plasma Glucose: 117 mg/dL

## 2016-04-04 ENCOUNTER — Other Ambulatory Visit: Payer: Medicare Other

## 2016-04-05 ENCOUNTER — Encounter: Payer: Self-pay | Admitting: Internal Medicine

## 2016-04-05 ENCOUNTER — Ambulatory Visit (INDEPENDENT_AMBULATORY_CARE_PROVIDER_SITE_OTHER): Payer: Medicare Other | Admitting: Internal Medicine

## 2016-04-05 VITALS — BP 132/80 | HR 70 | Temp 98.0°F | Ht 66.0 in | Wt 194.0 lb

## 2016-04-05 DIAGNOSIS — C649 Malignant neoplasm of unspecified kidney, except renal pelvis: Secondary | ICD-10-CM | POA: Diagnosis not present

## 2016-04-05 DIAGNOSIS — E039 Hypothyroidism, unspecified: Secondary | ICD-10-CM

## 2016-04-05 DIAGNOSIS — E669 Obesity, unspecified: Secondary | ICD-10-CM

## 2016-04-05 DIAGNOSIS — I10 Essential (primary) hypertension: Secondary | ICD-10-CM | POA: Diagnosis not present

## 2016-04-05 DIAGNOSIS — Z0000 Encounter for general adult medical examination without abnormal findings: Secondary | ICD-10-CM | POA: Diagnosis not present

## 2016-04-05 DIAGNOSIS — Z1211 Encounter for screening for malignant neoplasm of colon: Secondary | ICD-10-CM | POA: Diagnosis not present

## 2016-04-05 DIAGNOSIS — E785 Hyperlipidemia, unspecified: Secondary | ICD-10-CM

## 2016-04-05 DIAGNOSIS — E119 Type 2 diabetes mellitus without complications: Secondary | ICD-10-CM

## 2016-04-05 DIAGNOSIS — N400 Enlarged prostate without lower urinary tract symptoms: Secondary | ICD-10-CM | POA: Diagnosis not present

## 2016-04-05 NOTE — Progress Notes (Signed)
Patient ID: Nicholas Rivera, male   DOB: 1940-10-17, 75 y.o.   MRN: [id number]   Location:   [place]   Place of Service:   OFFICE   Patient Care Team: Estill Dooms, MD as PCP - General (Internal Medicine) [name], MD as Consulting Physician (Urology) [name], MD as Consulting Physician (Ophthalmology) [name], MD as Consulting Physician (Ophthalmology)  Extended Emergency Contact Information Primary Emergency Contact: Payton Doughty Address: [address], [place] [address] [place] Phone: [phone] Mobile Phone: [phone] Relation: Daughter Secondary Emergency Contact: Berneda Rose [place] Phone: [phone] Relation: Spouse  Code Status: full Goals of Care: Advanced Directive information Advanced Directives 04/05/2016  Does patient have an advance directive? Yes  Type of Advance Directive [place]  Does patient want to make changes to advanced directive? -  Copy of advanced directive(s) in chart? Yes     Chief Complaint  Patient presents with  . Annual Exam    Wellness exam  . Medical Management of Chronic Issues    medication management blood sugar, thryroid, cholesterol, blood pressure    HPI: Patient is a 75 y.o. male seen in today for an annual wellness exam.  Generally feeling well.   Essential hypertension, benign - controlled  Diabetes mellitus without complication (HCC) - controlled  Hypothyroidism, unspecified hypothyroidism type - compensated  Obesity - gaining again  Cancer of kidney, unspecified laterality ([place]) - no relapse  BPH (benign prostatic hyperplasia) - followed by Dr. Alinda Money,, urol.  Hyperlipidemia - controlled    Depression screen [facility] 2/9 04/05/2016 06/30/2015 05/13/2015 01/14/2015 12/10/2014  Decreased Interest 0 0 0 0 0  Down, Depressed, Hopeless 0 0 0 0 0  PHQ - 2 Score 0 0 0 0 0    Fall Risk  04/05/2016 08/04/2015 06/30/2015 05/13/2015 02/20/2015    Falls in the past year? No No No No No  Number falls in past yr: - - - - -  Injury with Fall? - - - - -   MMSE - [place] Exam 04/05/2016 06/04/2014  Orientation to time 5 5  Orientation to Place 5 5  Registration 3 3  Attention/ Calculation 5 5  Recall 2 3  Language- name 2 objects 2 2  Language- repeat 1 1  Language- follow 3 step command 3 3  Language- read & follow direction 1 1  Write a sentence 1 1  Copy design 1 1  Total score 29 30     Health Maintenance  Topic Date Due  . ZOSTAVAX  12/07/2002  . TETANUS/TDAP  09/05/2013  . COLONOSCOPY  09/05/2014  . OPHTHALMOLOGY EXAM  02/27/2015  . FOOT EXAM  06/05/2015  . PNA vac Low Risk Adult (2 of 2 - PPSV23) 06/05/2015  . INFLUENZA VACCINE  04/05/2016  . URINE MICROALBUMIN  05/07/2016  . HEMOGLOBIN A1C  10/02/2016    Urinary incontinence? no Functional Status Survey:Functional Status Survey: Is the patient deaf or have difficulty hearing?: No Does the patient have difficulty seeing, even when wearing glasses/contacts?: No Does the patient have difficulty concentrating, remembering, or making decisions?: No Does the patient have difficulty walking or climbing stairs?: No Does the patient have difficulty dressing or bathing?: No Does the patient have difficulty doing errands alone such as visiting a doctor's office or shopping?: No    Exercise? regular Diet? Avoids sweets Vision Screening Comments: Saw Dr.  Digby 03/05/16 Hearing:  Mild loss Dentition:OK Pain:None  Past Medical History:  Diagnosis Date  . Back pain, lumbosacral 2014  . Cancer of kidney ([place]) 2012  . Cataract 2013  . Diabetes mellitus without complication ([place]) 0000000  . Hypertension 2012  . Nephrolithiasis 2012  . Obesity, unspecified   . Other and unspecified hyperlipidemia 04/03/2013  . Pain, joint, ankle and foot   . Unspecified hypothyroidism 2012    Past Surgical History:  Procedure Laterality Date  . CATARACT EXTRACTION W/  INTRAOCULAR LENS IMPLANT Bilateral 03/05/2016   Dr. [name]  . KIDNEY SURGERY  12/2010   had cancer removed; Dr. Alinda Money  . ROTATOR CUFF REPAIR     bilateral; Dr. [name]    History reviewed. No pertinent family history.   No Known Allergies    Medication List       Accurate as of 04/05/16  3:34 PM. Always use your most recent med list.          aspirin 81 MG tablet One daily to revent heart attack or stroke   levothyroxine 50 MCG tablet Commonly known as:  SYNTHROID, LEVOTHROID Take one daily for thyroid supplement.   metFORMIN 500 MG tablet Commonly known as:  GLUCOPHAGE TAKE 1 TABLET BY MOUTH TWICE DAILY TO CONTROL DIABETES   ONE TOUCH ULTRA TEST test strip Generic drug:  glucose blood CHECK BLOOD SUGAR ONCE DAILY AS DIRECTED   ONETOUCH DELICA LANCETS FINE Misc USE TO CHECK BLOOD SUGAR ONCE DAILY        Review of Systems:  Review of Systems  Constitutional:       Obese  HENT: Positive for hearing loss.   Eyes: Negative.   Respiratory: Negative for cough, chest tightness and wheezing.   Cardiovascular: Negative for chest pain, palpitations and leg swelling.  Gastrointestinal: Negative for abdominal distention and abdominal pain.       Colonoscopy before 2007  Endocrine: Negative for cold intolerance, heat intolerance, polydipsia, polyphagia and polyuria.       Hx DM and hypothyroidism.  Genitourinary:       Hx Ca left kidney and removal surgically. Hx BPH. Hx elevated PSA. Bx prostate in Sept 2016 by Dr. Alinda Money,  Musculoskeletal: Positive for back pain.       History of sprain of the right ankle  Skin: Negative for rash.       Nontender sebaceous cyst of upper back  Neurological: Negative.   Hematological: Negative.   Psychiatric/Behavioral: Negative.     Physical Exam: Vitals:   04/05/16 1506  BP: 132/80  Pulse: 70  Temp: 98 F (36.7 C)  TempSrc: Oral  SpO2: 97%  Weight: 194 lb (88 kg)  Height: 5\' 6"  (1.676 m)   Body mass index is 31.31  kg/m. Physical Exam  Constitutional: He is oriented to person, place, and time. No distress.  Obese.  HENT:  Head: Normocephalic.  Right Ear: External ear normal.  Left Ear: External ear normal.  Eyes: Conjunctivae and EOM are normal. Pupils are equal, round, and reactive to light.  Corrective lenses.  Neck: Normal range of motion. Neck supple. No JVD present. No tracheal deviation present. No thyromegaly present.  Cardiovascular: Normal rate, regular rhythm, normal heart sounds and intact distal pulses.  Exam reveals no gallop and no friction rub.   No murmur heard. Pulmonary/Chest: Effort normal and breath sounds normal. No respiratory distress. He has no wheezes. He has no rales. He exhibits no tenderness.  Abdominal: Soft. Bowel sounds are normal.  He exhibits no distension and no mass. There is no tenderness.  Genitourinary:  Genitourinary Comments: By Dr.  Alinda Money  Musculoskeletal: Normal range of motion. He exhibits edema and tenderness.  Lymphadenopathy:    He has no cervical adenopathy.  Neurological: He is alert and oriented to person, place, and time. No cranial nerve deficit. Coordination normal.  04/05/16 MMSE 29/30. Passed clock drawing.  Skin: No rash noted. No erythema.  Nontender sebaceous cyst of the upper back.  Psychiatric: He has a normal mood and affect. His behavior is normal. Judgment and thought content normal.    Labs reviewed: Basic Metabolic Panel:  Recent Labs  08/04/15 1706 09/18/15 0820 04/01/16 0845  NA 140 140 139  K 4.9 4.2 4.7  CL 101 102 104  CO2 24 22 25   GLUCOSE 95 91 95  BUN 12 13 16   CREATININE 0.88 0.90 1.05  CALCIUM 9.8 9.3 9.3  TSH  --  2.460 2.99   Liver Function Tests:  Recent Labs  08/04/15 1706 09/18/15 0820  AST 13 16  ALT 11 11  ALKPHOS 95 93  BILITOT 0.3 0.3  PROT 6.4 6.1  ALBUMIN 4.1 3.9   No results for input(s): LIPASE, AMYLASE in the last 8760 hours. No results for input(s): AMMONIA in the last 8760  hours. CBC:  Recent Labs  08/04/15 1706  WBC 10.1  NEUTROABS 6.1  HCT 41.0  MCV 87   Lipid Panel:  Recent Labs  05/08/15 0837 09/18/15 0820 04/01/16 0845  CHOL 199 165 216*  HDL 42 47 58  LDLCALC 124* 97 128  TRIG 164* 104 151*  CHOLHDL 4.7 3.5 3.7   Lab Results  Component Value Date   HGBA1C 5.7 (H) 04/01/2016    Procedures: 04/05/16 EKG: rate 67. NSR. Normal. Assessment/Plan  1. Essential hypertension, benign - EKG [id number] - Basic metabolic panel; Future - Hemoglobin A1c; Future  2. Diabetes mellitus without complication (HCC) - Basic metabolic panel; Future - Microalbumin, urine; Future  3. Essential hypertension - bmp future 4. Hypothyroidism, unspecified hypothyroidism type - TSH; Future  5. Obesity Patient aware he needs to start losing weight. He anticipates cutting back on breads.  6. Cancer of kidney, unspecified laterality ([place]) 5 years without evidence of relapse  7. BPH (benign prostatic hyperplasia) Quite enlarged. Nontender.        8. Hyperlipidemia - Lipid panel; Future  9. Special screening for malignant neoplasms, colon - Cologuard

## 2016-04-06 ENCOUNTER — Ambulatory Visit: Payer: Medicare Other | Admitting: Internal Medicine

## 2016-04-25 DIAGNOSIS — Z1212 Encounter for screening for malignant neoplasm of rectum: Secondary | ICD-10-CM | POA: Diagnosis not present

## 2016-04-25 DIAGNOSIS — Z1211 Encounter for screening for malignant neoplasm of colon: Secondary | ICD-10-CM | POA: Diagnosis not present

## 2016-04-27 ENCOUNTER — Telehealth: Payer: Self-pay

## 2016-04-27 LAB — COLOGUARD: Cologuard: NEGATIVE

## 2016-04-27 NOTE — Telephone Encounter (Signed)
Following up on Cologuard, Dr. Nyoka Cowden wanted to know if he had done it yet. Yes, he mailed it back to them Monday 04/25/16.

## 2016-04-27 NOTE — Addendum Note (Signed)
Addended by: Ripley Fraise on: 04/27/2016 03:51 PM   Modules accepted: Orders

## 2016-05-10 ENCOUNTER — Encounter: Payer: Self-pay | Admitting: *Deleted

## 2016-05-10 NOTE — Telephone Encounter (Signed)
Spoke with patient and advised cologuard negative.

## 2016-05-17 ENCOUNTER — Ambulatory Visit (HOSPITAL_COMMUNITY)
Admission: RE | Admit: 2016-05-17 | Discharge: 2016-05-17 | Disposition: A | Discharge status: Home / Self Care | Payer: Medicare Other | Source: Ambulatory Visit | Attending: Urology | Admitting: Urology

## 2016-05-17 ENCOUNTER — Other Ambulatory Visit (HOSPITAL_COMMUNITY): Payer: Self-pay | Admitting: Urology

## 2016-05-17 DIAGNOSIS — C642 Malignant neoplasm of left kidney, except renal pelvis: Secondary | ICD-10-CM

## 2016-05-17 DIAGNOSIS — C649 Malignant neoplasm of unspecified kidney, except renal pelvis: Secondary | ICD-10-CM | POA: Diagnosis not present

## 2016-05-24 DIAGNOSIS — N401 Enlarged prostate with lower urinary tract symptoms: Secondary | ICD-10-CM | POA: Diagnosis not present

## 2016-05-24 DIAGNOSIS — R972 Elevated prostate specific antigen [PSA]: Secondary | ICD-10-CM | POA: Diagnosis not present

## 2016-05-24 DIAGNOSIS — C642 Malignant neoplasm of left kidney, except renal pelvis: Secondary | ICD-10-CM | POA: Diagnosis not present

## 2016-06-06 ENCOUNTER — Other Ambulatory Visit: Payer: Self-pay

## 2016-06-06 MED ORDER — LEVOTHYROXINE SODIUM 50 MCG PO TABS: 50.0000 ug | ORAL_TABLET | Freq: Every day | ORAL | 1 refills | Status: DC

## 2016-08-30 ENCOUNTER — Ambulatory Visit (INDEPENDENT_AMBULATORY_CARE_PROVIDER_SITE_OTHER): Payer: Medicare Other | Admitting: Internal Medicine

## 2016-08-30 ENCOUNTER — Encounter: Payer: Self-pay | Admitting: Internal Medicine

## 2016-08-30 DIAGNOSIS — R197 Diarrhea, unspecified: Secondary | ICD-10-CM | POA: Insufficient documentation

## 2016-08-30 MED ORDER — METRONIDAZOLE 500 MG PO TABS: 500.0000 mg | ORAL_TABLET | Freq: Three times a day (TID) | ORAL | 0 refills | Status: DC

## 2016-08-30 MED ORDER — CIPROFLOXACIN HCL 500 MG PO TABS: 500.0000 mg | ORAL_TABLET | Freq: Two times a day (BID) | ORAL | 0 refills | Status: DC

## 2016-08-30 NOTE — Patient Instructions (Signed)
If you are not better by next week, please call for follow up appointment or referral to Gastroenterologist.

## 2016-08-30 NOTE — Progress Notes (Signed)
Facility  [facility]    Place of Service:   OFFICE    No Known Allergies  Chief Complaint  Patient presents with  . Acute Visit    diarrhea for 11 days.    HPI:  Stools started at about 6 times daily, but is doing a little better since 6 days ago.. Eating only rice and toast for several days. No blood in stool.  No fever. Has not felt hot. Some nausea, but no vomiting. Had sigmoid exam in 2006. Had a negative Cologard test in August 2017. Had colonoscopy in 2006 (erroneously entered as sigmoid exam in his current record.Was incontinent at one time. At one point he thought he had food poisoning.   Stomach is churning and rolling today.  Medications: Patient's Medications  New Prescriptions   No medications on file  Previous Medications   ASPIRIN 81 MG TABLET    One daily to revent heart attack or stroke   FINASTERIDE (PROSCAR) 5 MG TABLET    Take one tablet daily for prostate   LEVOTHYROXINE (SYNTHROID, LEVOTHROID) 50 MCG TABLET    Take 1 tablet (50 mcg total) by mouth daily before breakfast.   METFORMIN (GLUCOPHAGE) 500 MG TABLET    TAKE 1 TABLET BY MOUTH TWICE DAILY TO CONTROL DIABETES   ONE TOUCH ULTRA TEST TEST STRIP    CHECK BLOOD SUGAR ONCE DAILY AS DIRECTED   ONETOUCH DELICA LANCETS FINE MISC    USE TO CHECK BLOOD SUGAR ONCE DAILY  Modified Medications   No medications on file  Discontinued Medications   No medications on file    Review of Systems  Constitutional:       Obese  HENT: Positive for hearing loss.   Eyes: Negative.   Respiratory: Negative for cough, chest tightness and wheezing.   Cardiovascular: Negative for chest pain, palpitations and leg swelling.  Gastrointestinal: Positive for diarrhea and nausea. Negative for abdominal distention and abdominal pain.       Colonoscopy before 2007  Endocrine: Negative for cold intolerance, heat intolerance, polydipsia, polyphagia and polyuria.       Hx DM and hypothyroidism.  Genitourinary:       Hx Ca left kidney  and removal surgically. Hx BPH. Hx elevated PSA. Bx prostate in Sept 2016 by Dr. Alinda Money,  Musculoskeletal: Positive for back pain.       History of sprain of the right ankle  Skin: Negative for rash.       Nontender sebaceous cyst of upper back  Neurological: Negative.   Hematological: Negative.   Psychiatric/Behavioral: Negative.     Vitals:   08/30/16 1458  BP: 120/78  Pulse: 85  Temp: 97.5 F (36.4 C)  TempSrc: Oral  SpO2: 98%  Height: '5\' 6"'$  (1.676 m)   There is no height or weight on file to calculate BMI. Wt Readings from Last 3 Encounters:  04/05/16 194 lb (88 kg)  09/30/15 189 lb (85.7 kg)  08/04/15 187 lb 6.4 oz (85 kg)      Physical Exam  Constitutional: He is oriented to person, place, and time. No distress.  Obese.  HENT:  Head: Normocephalic.  Right Ear: External ear normal.  Left Ear: External ear normal.  Eyes: Conjunctivae and EOM are normal. Pupils are equal, round, and reactive to light.  Corrective lenses.  Neck: Normal range of motion. Neck supple. No JVD present. No tracheal deviation present. No thyromegaly present.  Cardiovascular: Normal rate, regular rhythm, normal heart sounds and intact distal  pulses.  Exam reveals no gallop and no friction rub.   No murmur heard. Pulmonary/Chest: Effort normal and breath sounds normal. No respiratory distress. He has no wheezes. He has no rales. He exhibits no tenderness.  Abdominal: Soft. Bowel sounds are normal. He exhibits no distension and no mass. There is no tenderness.  hperactive bowel sounds  Genitourinary:  Genitourinary Comments: By Dr.  Alinda Money  Musculoskeletal: Normal range of motion. He exhibits edema and tenderness.  Lymphadenopathy:    He has no cervical adenopathy.  Neurological: He is alert and oriented to person, place, and time. No cranial nerve deficit. Coordination normal.  04/05/16 MMSE 29/30. Passed clock drawing.  Skin: No rash noted. No erythema.  Nontender sebaceous cyst of the  upper back.  Psychiatric: He has a normal mood and affect. His behavior is normal. Judgment and thought content normal.    Labs reviewed: Lab Summary Latest Ref Rng & Units 04/01/2016 09/18/2015 08/04/2015  Hemoglobin 12.6 - 17.7 g/dL (None) (None) 13.6  Hematocrit 37.5 - 51.0 % (None) (None) 41.0  White count 3.4 - 10.8 x10E3/uL (None) (None) 10.1  Platelet count - (None) (None) (None)  Sodium 135 - 146 mmol/L 139 140 140  Potassium 3.5 - 5.3 mmol/L 4.7 4.2 4.9  Calcium 8.6 - 10.3 mg/dL 9.3 9.3 9.8  Phosphorus - (None) (None) (None)  Creatinine 0.70 - 1.18 mg/dL 1.05 0.90 0.88  AST 0 - 40 IU/L (None) 16 13  Alk Phos 39 - 117 IU/L (None) 93 95  Bilirubin 0.0 - 1.2 mg/dL (None) 0.3 0.3  Glucose 65 - 99 mg/dL 95 91 95  Cholesterol 125 - 200 mg/dL 216(H) (None) (None)  HDL cholesterol >=40 mg/dL 58 47 (None)  Triglycerides <150 mg/dL 151(H) 104 (None)  LDL Direct - (None) (None) (None)  LDL Calc <130 mg/dL 128 97 (None)  Total protein - (None) (None) (None)  Albumin 3.5 - 4.8 g/dL (None) 3.9 4.1  Some recent data might be hidden   Lab Results  Component Value Date   TSH 2.99 04/01/2016   TSH 2.460 09/18/2015   TSH 2.330 12/08/2014   Lab Results  Component Value Date   BUN 16 04/01/2016   BUN 13 09/18/2015   BUN 12 08/04/2015   Lab Results  Component Value Date   HGBA1C 5.7 (H) 04/01/2016   HGBA1C 5.7 (H) 09/18/2015   HGBA1C 6.0 (H) 05/08/2015    Assessment/Plan  1. Diarrhea, unspecified type - ciprofloxacin (CIPRO) 500 MG tablet; Take 1 tablet (500 mg total) by mouth 2 (two) times daily.  Dispense: 14 tablet; Refill: 0 - metroNIDAZOLE (FLAGYL) 500 MG tablet; Take 1 tablet (500 mg total) by mouth 3 (three) times daily.  Dispense: 21 tablet; Refill: 0

## 2016-09-29 ENCOUNTER — Ambulatory Visit (INDEPENDENT_AMBULATORY_CARE_PROVIDER_SITE_OTHER): Payer: Medicare Other | Admitting: Nurse Practitioner

## 2016-09-29 ENCOUNTER — Encounter: Payer: Self-pay | Admitting: Nurse Practitioner

## 2016-09-29 VITALS — BP 128/82 | HR 91 | Temp 97.9°F | Resp 18 | Ht 66.0 in | Wt 199.0 lb

## 2016-09-29 DIAGNOSIS — M25571 Pain in right ankle and joints of right foot: Secondary | ICD-10-CM | POA: Diagnosis not present

## 2016-09-29 NOTE — Patient Instructions (Addendum)
-  Aleve twice a day for 5 days, do not take the Ibuprofen with the Aleve. -Ice area several times a day to decrease swelling   -Call office for increased swelling and/or redness -Monitor for fever, chills or other infectious changes

## 2016-09-29 NOTE — Progress Notes (Signed)
Careteam: Patient Care Team: Estill Dooms, MD as PCP - General (Internal Medicine) [name], MD as Consulting Physician (Urology) [name], MD as Consulting Physician (Ophthalmology) [name], MD as Consulting Physician (Ophthalmology)  Advanced Directive information Does Patient Have a Medical Advance Directive?: Yes, Type of Advance Directive: Healthcare Power of Attorney  No Known Allergies  Chief Complaint  Patient presents with  . Acute Visit    Red, sore spot on outside of great toe joint of right foot x 3 days.      HPI: Patient is a 76 y.o. male seen in the office today w/ c/o medial aspect pain of the hallux bone. Pt states this pain began two days ago after he had been wearing boots for his usual walking exercises. He rates the pain as a 3/10 in severity and would not have come in today except for the proximal redness and mild swelling. He has been taking Ibuprofen every 6 hours with pain releilf. He states that he has not had changes in gait or any functional decline do to this pain. He denies a history of gout, arthritis, traumatic injury, or previous bunion concerns. He has a history of DM,, HTN, asthma, hypothyroidism, kidney cancer.        Review of Systems:  Review of Systems  Constitutional: Negative.  Negative for activity change, chills and fever.  Respiratory: Negative.  Negative for cough, chest tightness, shortness of breath and wheezing.   Cardiovascular: Negative.  Negative for chest pain, palpitations and leg swelling.  Endocrine: Negative for polydipsia, polyphagia and polyuria.  Musculoskeletal: Negative for arthralgias and joint swelling.  Skin: Negative for color change.       Mild redness to the medial aspect of the great toe    Past Medical History:  Diagnosis Date  . Back pain, lumbosacral 2014  . Cancer of kidney ([place]) 2012  . Cataract 2013  . Diabetes mellitus without complication ([place]) 0000000  . Hypertension 2012  .  Nephrolithiasis 2012  . Obesity, unspecified   . Other and unspecified hyperlipidemia 04/03/2013  . Pain, joint, ankle and foot   . Unspecified hypothyroidism 2012   Past Surgical History:  Procedure Laterality Date  . CATARACT EXTRACTION W/ INTRAOCULAR LENS IMPLANT Bilateral 03/05/2016   Dr. [name]  . KIDNEY SURGERY  12/2010   had cancer removed; Dr. Alinda Money  . ROTATOR CUFF REPAIR     bilateral; Dr. [name]   Social History:   reports that he has never smoked. He has never used smokeless tobacco. He reports that he does not drink alcohol or use drugs.  History reviewed. No pertinent family history.  Medications: Patient's Medications  New Prescriptions   No medications on file  Previous Medications   ASPIRIN 81 MG TABLET    One daily to revent heart attack or stroke   FINASTERIDE (PROSCAR) 5 MG TABLET    Take one tablet daily for prostate   LEVOTHYROXINE (SYNTHROID, LEVOTHROID) 50 MCG TABLET    Take 1 tablet (50 mcg total) by mouth daily before breakfast.   METFORMIN (GLUCOPHAGE) 500 MG TABLET    TAKE 1 TABLET BY MOUTH TWICE DAILY TO CONTROL DIABETES   ONE TOUCH ULTRA TEST TEST STRIP    CHECK BLOOD SUGAR ONCE DAILY AS DIRECTED   ONETOUCH DELICA LANCETS FINE MISC    USE TO CHECK BLOOD SUGAR ONCE DAILY  Modified Medications   No medications on file  Discontinued Medications   CIPROFLOXACIN (CIPRO) 500 MG TABLET  Take 1 tablet (500 mg total) by mouth 2 (two) times daily.   METRONIDAZOLE (FLAGYL) 500 MG TABLET    Take 1 tablet (500 mg total) by mouth 3 (three) times daily.     Physical Exam:  Vitals:   09/29/16 1307  BP: 128/82  Pulse: 91  Resp: 18  Temp: 97.9 F (36.6 C)  TempSrc: Oral  SpO2: 97%  Weight: 199 lb (90.3 kg)  Height: 5\' 6"  (1.676 m)   Body mass index is 32.12 kg/m.  Physical Exam  Constitutional: He is oriented to person, place, and time. He appears well-developed and well-nourished. No distress.  Cardiovascular: Normal rate, regular rhythm and  normal heart sounds.   Pulmonary/Chest: Effort normal and breath sounds normal. No respiratory distress.  Musculoskeletal: He exhibits no edema or tenderness.       Right shoulder: He exhibits normal range of motion.       Right foot: There is decreased range of motion and bony tenderness.       Feet:  Neurological: He is alert and oriented to person, place, and time.  Skin: Skin is warm and dry. There is erythema.  Redness over medical aspect of right great toe  Psychiatric: He has a normal mood and affect.    Labs reviewed: Basic Metabolic Panel:  Recent Labs  04/01/16 0845  NA 139  K 4.7  CL 104  CO2 25  GLUCOSE 95  BUN 16  CREATININE 1.05  CALCIUM 9.3  TSH 2.99   Liver Function Tests: No results for input(s): AST, ALT, ALKPHOS, BILITOT, PROT, ALBUMIN in the last 8760 hours. No results for input(s): LIPASE, AMYLASE in the last 8760 hours. No results for input(s): AMMONIA in the last 8760 hours. CBC: No results for input(s): WBC, NEUTROABS, HGB, HCT, MCV, PLT in the last 8760 hours. Lipid Panel:  Recent Labs  04/01/16 0845  CHOL 216*  HDL 58  LDLCALC 128  TRIG 151*  CHOLHDL 3.7   TSH:  Recent Labs  04/01/16 0845  TSH 2.99   A1C: Lab Results  Component Value Date   HGBA1C 5.7 (H) 04/01/2016     Assessment/Plan 1. Pain in joint involving right ankle and foot Joint redness and swelling possibly due to change in footwear recently versus new onset gout, although point tenderness is mildly elevated at 3/10. Symptoms could also be due to arthritis. Pt will start Aleve twice a day for 5 days and begin ice therapy for swelling and pain. If symptoms continue for another week or worsen, pt is to call the office for further workup. I do not suspect an active infection at this time but if redness, swelling, tenderness or heat increases to notify immediately.   Carlos American. Harle Battiest  [facility] [phone] 8 am - 5  pm) [phone] (after hours)

## 2016-10-06 ENCOUNTER — Other Ambulatory Visit: Payer: Medicare Other

## 2016-10-07 ENCOUNTER — Other Ambulatory Visit: Payer: Medicare Other

## 2016-10-07 DIAGNOSIS — E119 Type 2 diabetes mellitus without complications: Secondary | ICD-10-CM | POA: Diagnosis not present

## 2016-10-07 DIAGNOSIS — E785 Hyperlipidemia, unspecified: Secondary | ICD-10-CM

## 2016-10-07 DIAGNOSIS — E039 Hypothyroidism, unspecified: Secondary | ICD-10-CM

## 2016-10-07 DIAGNOSIS — I10 Essential (primary) hypertension: Secondary | ICD-10-CM

## 2016-10-07 LAB — BASIC METABOLIC PANEL WITH GFR
BUN: 16 mg/dL (ref 7–25)
CO2: 25 mmol/L (ref 20–31)
Calcium: 9.5 mg/dL (ref 8.6–10.3)
Chloride: 101 mmol/L (ref 98–110)
Creat: 0.93 mg/dL (ref 0.70–1.18)
Glucose, Bld: 86 mg/dL (ref 65–99)
Potassium: 4.5 mmol/L (ref 3.5–5.3)
Sodium: 137 mmol/L (ref 135–146)

## 2016-10-07 LAB — LIPID PANEL
Cholesterol: 230 mg/dL — ABNORMAL HIGH (ref <200)
HDL: 50 mg/dL (ref >40)
LDL CALC: 138 mg/dL — AB (ref <100)
Total CHOL/HDL Ratio: 4.6 Ratio (ref <5.0)
Triglycerides: 211 mg/dL — ABNORMAL HIGH (ref <150)
VLDL: 42 mg/dL — ABNORMAL HIGH (ref <30)

## 2016-10-07 LAB — TSH: TSH: 3.13 m[IU]/L (ref 0.40–4.50)

## 2016-10-07 LAB — HEMOGLOBIN A1C
Hgb A1c MFr Bld: 5.4 % (ref <5.7)
Mean Plasma Glucose: 108 mg/dL

## 2016-10-08 LAB — MICROALBUMIN, URINE: Microalb, Ur: 0.5 mg/dL

## 2016-10-12 ENCOUNTER — Ambulatory Visit: Payer: Medicare Other | Admitting: Internal Medicine

## 2016-10-13 DIAGNOSIS — H26493 Other secondary cataract, bilateral: Secondary | ICD-10-CM | POA: Diagnosis not present

## 2016-10-13 DIAGNOSIS — H3561 Retinal hemorrhage, right eye: Secondary | ICD-10-CM | POA: Diagnosis not present

## 2016-10-13 LAB — HM DIABETES EYE EXAM

## 2016-10-19 ENCOUNTER — Encounter: Payer: Self-pay | Admitting: Internal Medicine

## 2016-10-19 ENCOUNTER — Ambulatory Visit (INDEPENDENT_AMBULATORY_CARE_PROVIDER_SITE_OTHER): Payer: Medicare Other | Admitting: Internal Medicine

## 2016-10-19 VITALS — BP 126/78 | HR 60 | Temp 97.8°F | Ht 66.0 in | Wt 193.8 lb

## 2016-10-19 DIAGNOSIS — E119 Type 2 diabetes mellitus without complications: Secondary | ICD-10-CM | POA: Diagnosis not present

## 2016-10-19 DIAGNOSIS — E039 Hypothyroidism, unspecified: Secondary | ICD-10-CM

## 2016-10-19 DIAGNOSIS — M25571 Pain in right ankle and joints of right foot: Secondary | ICD-10-CM

## 2016-10-19 DIAGNOSIS — Z6831 Body mass index (BMI) 31.0-31.9, adult: Secondary | ICD-10-CM | POA: Diagnosis not present

## 2016-10-19 DIAGNOSIS — E785 Hyperlipidemia, unspecified: Secondary | ICD-10-CM | POA: Diagnosis not present

## 2016-10-19 DIAGNOSIS — I10 Essential (primary) hypertension: Secondary | ICD-10-CM

## 2016-10-19 DIAGNOSIS — E6609 Other obesity due to excess calories: Secondary | ICD-10-CM

## 2016-10-19 MED ORDER — METFORMIN HCL 500 MG PO TABS: ORAL_TABLET | ORAL | 3 refills | Status: DC

## 2016-10-19 NOTE — Progress Notes (Signed)
Facility  [facility]    Place of Service:   OFFICE    No Known Allergies  Chief Complaint  Patient presents with  . Medical Management of Chronic Issues    6 month follow-up, disucss labs (copy printed)   . Medication Refill    Refill Metformin- [facility] #90 day supply     HPI:   Hyperlipidemia, unspecified hyperlipidemia type - continue to regulate with diet and weight loss  Diabetes mellitus without complication ([place]) - Plan: metFORMIN (GLUCOPHAGE) 500 MG tablet - controlled  Essential hypertension -  controlled  Hypothyroidism, unspecified type - compensated  Class 1 obesity due to excess calories without serious comorbidity with body mass index (BMI) of 31.0 to 31.9 in adult - regaining weight as he lives with his son, who pt says does not each very healthy.  Pain in joint involving right ankle and foot - resolution of prior irritation of right great toe bunion.    Medications: Patient's Medications  New Prescriptions   No medications on file  Previous Medications   ASPIRIN 81 MG TABLET    One daily to revent heart attack or stroke   FINASTERIDE (PROSCAR) 5 MG TABLET    Take one tablet daily for prostate   LEVOTHYROXINE (SYNTHROID, LEVOTHROID) 50 MCG TABLET    Take 1 tablet (50 mcg total) by mouth daily before breakfast.   ONE TOUCH ULTRA TEST TEST STRIP    CHECK BLOOD SUGAR ONCE DAILY AS DIRECTED   ONETOUCH DELICA LANCETS FINE MISC    USE TO CHECK BLOOD SUGAR ONCE DAILY  Modified Medications   Modified Medication Previous Medication   METFORMIN (GLUCOPHAGE) 500 MG TABLET metFORMIN (GLUCOPHAGE) 500 MG tablet      TAKE 1 TABLET BY MOUTH TWICE DAILY TO CONTROL DIABETES    TAKE 1 TABLET BY MOUTH TWICE DAILY TO CONTROL DIABETES  Discontinued Medications   No medications on file    Review of Systems  Constitutional: Negative.  Negative for activity change, chills and fever.  Respiratory: Negative.  Negative for cough, chest tightness, shortness of breath and  wheezing.   Cardiovascular: Negative.  Negative for chest pain, palpitations and leg swelling.  Endocrine: Negative for polydipsia, polyphagia and polyuria.  Musculoskeletal: Negative for arthralgias and joint swelling.  Skin: Negative for color change.    Vitals:   10/19/16 1051  BP: 126/78  Pulse: 60  Temp: 97.8 F (36.6 C)  TempSrc: Oral  SpO2: 95%  Weight: 193 lb 12.8 oz (87.9 kg)  Height: 5' 6" (1.676 m)   Body mass index is 31.28 kg/m. Wt Readings from Last 3 Encounters:  10/19/16 193 lb 12.8 oz (87.9 kg)  09/29/16 199 lb (90.3 kg)  04/05/16 194 lb (88 kg)      Physical Exam  Constitutional: He is oriented to person, place, and time. He appears well-developed and well-nourished. No distress.  Cardiovascular: Normal rate, regular rhythm and normal heart sounds.   Pulmonary/Chest: Effort normal and breath sounds normal. No respiratory distress.  Musculoskeletal: He exhibits no edema or tenderness.       Right shoulder: He exhibits normal range of motion.       Right foot: There is normal range of motion and no bony tenderness.  Neurological: He is alert and oriented to person, place, and time.  Skin: Skin is warm and dry. No erythema.  Psychiatric: He has a normal mood and affect.    Labs reviewed: Lab Summary Latest Ref Rng & Units 10/07/2016 04/01/2016  09/18/2015  Hemoglobin - (None) (None) (None)  Hematocrit - (None) (None) (None)  White count - (None) (None) (None)  Platelet count - (None) (None) (None)  Sodium 135 - 146 mmol/L 137 139 140  Potassium 3.5 - 5.3 mmol/L 4.5 4.7 4.2  Calcium 8.6 - 10.3 mg/dL 9.5 9.3 9.3  Phosphorus - (None) (None) (None)  Creatinine 0.70 - 1.18 mg/dL 0.93 1.05 0.90  AST 0 - 40 IU/L (None) (None) 16  Alk Phos 39 - 117 IU/L (None) (None) 93  Bilirubin 0.0 - 1.2 mg/dL (None) (None) 0.3  Glucose 65 - 99 mg/dL 86 95 91  Cholesterol <200 mg/dL 230(H) 216(H) (None)  HDL cholesterol >40 mg/dL 50 58 47  Triglycerides <150 mg/dL 211(H)  151(H) 104  LDL Direct - (None) (None) (None)  LDL Calc <100 mg/dL 138(H) 128 97  Total protein - (None) (None) (None)  Albumin 3.5 - 4.8 g/dL (None) (None) 3.9  Some recent data might be hidden   Lab Results  Component Value Date   TSH 3.13 10/07/2016   TSH 2.99 04/01/2016   TSH 2.460 09/18/2015   Lab Results  Component Value Date   BUN 16 10/07/2016   BUN 16 04/01/2016   BUN 13 09/18/2015   Lab Results  Component Value Date   HGBA1C 5.4 10/07/2016   HGBA1C 5.7 (H) 04/01/2016   HGBA1C 5.7 (H) 09/18/2015    Assessment/Plan  1. Diabetes mellitus without complication (HCC) - metFORMIN (GLUCOPHAGE) 500 MG tablet; TAKE 1 TABLET BY MOUTH TWICE DAILY TO CONTROL DIABETES  Dispense: 180 tablet; Refill: 3 - Hemoglobin A1c; Future - Microalbumin, urine; Future - Basic metabolic panel; Future  2. Hyperlipidemia, unspecified hyperlipidemia type - Lipid panel; Future  3. Essential hypertension The current medical regimen is effective;  continue present plan and medications.  4. Hypothyroidism, unspecified type compensatd - TSH; Future  5. Class 1 obesity due to excess calories without serious comorbidity with body mass index (BMI) of 31.0 to 31.9 in adult Continue weight loss. Provided 1000 calorie diet.  6. Pain in joint involving right ankle and foot Improved right bunion

## 2017-01-04 ENCOUNTER — Ambulatory Visit: Payer: Medicare Other

## 2017-01-05 ENCOUNTER — Ambulatory Visit: Payer: Medicare Other

## 2017-01-06 ENCOUNTER — Ambulatory Visit: Payer: Medicare Other

## 2017-01-06 ENCOUNTER — Ambulatory Visit (INDEPENDENT_AMBULATORY_CARE_PROVIDER_SITE_OTHER): Payer: Medicare Other

## 2017-01-06 VITALS — BP 134/82 | HR 69 | Temp 97.8°F | Ht 66.0 in | Wt 208.0 lb

## 2017-01-06 DIAGNOSIS — Z0000 Encounter for general adult medical examination without abnormal findings: Secondary | ICD-10-CM | POA: Diagnosis not present

## 2017-01-06 MED ORDER — TETANUS-DIPHTH-ACELL PERTUSSIS 5-2.5-18.5 LF-MCG/0.5 IM SUSP: 0.5000 mL | Freq: Once | INTRAMUSCULAR | 0 refills | Status: AC

## 2017-01-06 MED ORDER — LEVOTHYROXINE SODIUM 50 MCG PO TABS: 50.0000 ug | ORAL_TABLET | Freq: Every day | ORAL | 1 refills | Status: DC

## 2017-01-06 NOTE — Progress Notes (Signed)
Subjective:   Nicholas Rivera is a 76 y.o. male who presents for Medicare Annual/Subsequent preventive examination.     Objective:    Vitals: BP 134/82 (BP Location: Left Arm, Patient Position: Sitting)   Pulse 69   Temp 97.8 F (36.6 C) (Oral)   Ht 5\' 6"  (1.676 m)   Wt 208 lb (94.3 kg)   SpO2 97%   BMI 33.57 kg/m   Body mass index is 33.57 kg/m.  Tobacco History  Smoking Status  . Never Smoker  Smokeless Tobacco  . Never Used     Counseling given: Not Answered   Past Medical History:  Diagnosis Date  . Back pain, lumbosacral 2014  . Cancer of kidney ([place]) 2012  . Cataract 2013  . Diabetes mellitus without complication ([place]) 5170  . Hypertension 2012  . Nephrolithiasis 2012  . Obesity, unspecified   . Other and unspecified hyperlipidemia 04/03/2013  . Pain, joint, ankle and foot   . Unspecified hypothyroidism 2012   Past Surgical History:  Procedure Laterality Date  . CATARACT EXTRACTION W/ INTRAOCULAR LENS IMPLANT Bilateral 03/05/2016   Dr. [name]  . KIDNEY SURGERY  12/2010   had cancer removed; Dr. Alinda Money  . ROTATOR CUFF REPAIR     bilateral; Dr. [name]   History reviewed. No pertinent family history. History  Sexual Activity  . Sexual activity: Yes    Outpatient Encounter Prescriptions as of 01/06/2017  Medication Sig  . aspirin 81 MG tablet One daily to revent heart attack or stroke  . finasteride (PROSCAR) 5 MG tablet Take one tablet daily for prostate  . levothyroxine (SYNTHROID, LEVOTHROID) 50 MCG tablet Take 1 tablet (50 mcg total) by mouth daily before breakfast.  . metFORMIN (GLUCOPHAGE) 500 MG tablet TAKE 1 TABLET BY MOUTH TWICE DAILY TO CONTROL DIABETES  . ONE TOUCH ULTRA TEST test strip CHECK BLOOD SUGAR ONCE DAILY AS DIRECTED  . ONETOUCH DELICA LANCETS FINE MISC USE TO CHECK BLOOD SUGAR ONCE DAILY  . Tdap (BOOSTRIX) 5-2.5-18.5 LF-MCG/0.5 injection Inject 0.5 mLs into the muscle once.  . [DISCONTINUED] levothyroxine (SYNTHROID, LEVOTHROID)  50 MCG tablet Take 1 tablet (50 mcg total) by mouth daily before breakfast.  . [DISCONTINUED] Tdap (BOOSTRIX) 5-2.5-18.5 LF-MCG/0.5 injection Inject 0.5 mLs into the muscle once.   No facility-administered encounter medications on file as of 01/06/2017.     Activities of Daily Living In your present state of health, do you have any difficulty performing the following activities: 01/06/2017 04/05/2016  Hearing? N N  Vision? N N  Difficulty concentrating or making decisions? N N  Walking or climbing stairs? N N  Dressing or bathing? N N  Doing errands, shopping? Y N  Preparing Food and eating ? N -  Using the Toilet? N -  In the past six months, have you accidently leaked urine? N -  Do you have problems with loss of bowel control? N -  Managing your Medications? N -  Managing your Finances? N -  Housekeeping or managing your Housekeeping? N -  Some recent data might be hidden    Patient Care Team: Gayland Curry, DO as PCP - General (Geriatric Medicine) [name], MD as Consulting Physician (Urology) [name], MD as Consulting Physician (Ophthalmology) [name], MD as Consulting Physician (Ophthalmology)   Assessment:   Exercise Activities and Dietary recommendations Current Exercise Habits: Home exercise routine, Type of exercise: walking, Time (Minutes): 30, Frequency (Times/Week): 7, Weekly Exercise (Minutes/Week): 210, Intensity: Mild, Exercise limited by: None  identified  Goals    . Weight (lb) < 175 lb (79.4 kg)          Starting 01/06/2017 I would like to continue working to my goal weight.      Fall Risk Fall Risk  01/06/2017 10/19/2016 09/29/2016 04/05/2016 08/04/2015  Falls in the past year? No No No No No  Number falls in past yr: - - - - -  Injury with Fall? - - - - -   Depression Screen PHQ 2/9 Scores 01/06/2017 04/05/2016 06/30/2015 05/13/2015  PHQ - 2 Score 0 0 0 0    Cognitive Function MMSE - Mini Mental State Exam 01/06/2017 04/05/2016 06/04/2014  Orientation  to time 5 5 5   Orientation to Place 5 5 5   Registration 3 3 3   Attention/ Calculation 5 5 5   Recall 3 2 3   Language- name 2 objects 2 2 2   Language- repeat 1 1 1   Language- follow 3 step command 3 3 3   Language- read & follow direction 1 1 1   Write a sentence 1 1 1   Copy design 1 1 1   Total score 30 29 30         Immunization History  Administered Date(s) Administered  . Influenza,inj,Quad PF,36+ Mos 05/13/2015  . Influenza-Unspecified 06/05/2013, 06/20/2014, 05/30/2016  . Pneumococcal Conjugate-13 06/04/2014  . Pneumococcal Polysaccharide-23 09/05/1998  . Td 09/06/2003   Screening Tests Health Maintenance  Topic Date Due  . TETANUS/TDAP  09/05/2013  . OPHTHALMOLOGY EXAM  02/27/2015  . PNA vac Low Risk Adult (2 of 2 - PPSV23) 06/05/2015  . INFLUENZA VACCINE  04/05/2017  . FOOT EXAM  04/05/2017  . HEMOGLOBIN A1C  04/06/2017  . URINE MICROALBUMIN  10/07/2017  . Fecal DNA (Cologuard)  04/28/2019      Plan:    I have personally reviewed and addressed the Medicare Annual Wellness questionnaire and have noted the following in the patient's chart:  A. Medical and social history B. Use of alcohol, tobacco or illicit drugs  C. Current medications and supplements D. Functional ability and status E.  Nutritional status F.  Physical activity G. Advance directives H. List of other physicians I.  Hospitalizations, surgeries, and ER visits in previous 12 months J.  [place] to include hearing, vision, cognitive, depression L. Referrals and appointments - none  In addition, I have reviewed and discussed with patient certain preventive protocols, quality metrics, and best practice recommendations. A written personalized care plan for preventive services as well as general preventive health recommendations were provided to patient.  See attached scanned questionnaire for additional information.   Signed,   Rich Reining, RN Nurse Health Advisor

## 2017-01-06 NOTE — Patient Instructions (Addendum)
[name] , Thank you for taking time to come for your Medicare Wellness Visit. I appreciate your ongoing commitment to your health goals. Please review the following plan we discussed and let me know if I can assist you in the future.   Screening recommendations/referrals: Colonoscopy up to date Recommended yearly ophthalmology/optometry visit for glaucoma screening and checkup Recommended yearly dental visit for hygiene and checkup  Vaccinations: Influenza vaccine up to date Pneumococcal vaccine up to date Tdap vaccine due. I sent this prescription to you pharmacy.  Shingles vaccine not in records. If you want the new vaccine we can put in a prescription.    Advanced directives: In Chart  Conditions/risks identified: None  Next appointment: None upcoming  Preventive Care 65 Years and Older, Male Preventive care refers to lifestyle choices and visits with your health care provider that can promote health and wellness. What does preventive care include?  A yearly physical exam. This is also called an annual well check.  Dental exams once or twice a year.  Routine eye exams. Ask your health care provider how often you should have your eyes checked.  Personal lifestyle choices, including:  Daily care of your teeth and gums.  Regular physical activity.  Eating a healthy diet.  Avoiding tobacco and drug use.  Limiting alcohol use.  Practicing safe sex.  Taking low doses of aspirin every day.  Taking vitamin and mineral supplements as recommended by your health care provider. What happens during an annual well check? The services and screenings done by your health care provider during your annual well check will depend on your age, overall health, lifestyle risk factors, and family history of disease. Counseling  Your health care provider may ask you questions about your:  Alcohol use.  Tobacco use.  Drug use.  Emotional well-being.  Home and relationship  well-being.  Sexual activity.  Eating habits.  History of falls.  Memory and ability to understand (cognition).  Work and work Statistician. Screening  You may have the following tests or measurements:  Height, weight, and BMI.  Blood pressure.  Lipid and cholesterol levels. These may be checked every 5 years, or more frequently if you are over 69 years old.  Skin check.  Lung cancer screening. You may have this screening every year starting at age 21 if you have a 30-pack-year history of smoking and currently smoke or have quit within the past 15 years.  Fecal occult blood test (FOBT) of the stool. You may have this test every year starting at age 59.  Flexible sigmoidoscopy or colonoscopy. You may have a sigmoidoscopy every 5 years or a colonoscopy every 10 years starting at age 68.  Prostate cancer screening. Recommendations will vary depending on your family history and other risks.  Hepatitis C blood test.  Hepatitis B blood test.  Sexually transmitted disease (STD) testing.  Diabetes screening. This is done by checking your blood sugar (glucose) after you have not eaten for a while (fasting). You may have this done every 1-3 years.  Abdominal aortic aneurysm (AAA) screening. You may need this if you are a current or former smoker.  Osteoporosis. You may be screened starting at age 56 if you are at high risk. Talk with your health care provider about your test results, treatment options, and if necessary, the need for more tests. Vaccines  Your health care provider may recommend certain vaccines, such as:  Influenza vaccine. This is recommended every year.  Tetanus, diphtheria, and acellular pertussis (  Tdap, Td) vaccine. You may need a Td booster every 10 years.  Zoster vaccine. You may need this after age 33.  Pneumococcal 13-valent conjugate (PCV13) vaccine. One dose is recommended after age 26.  Pneumococcal polysaccharide (PPSV23) vaccine. One dose is  recommended after age 47. Talk to your health care provider about which screenings and vaccines you need and how often you need them. This information is not intended to replace advice given to you by your health care provider. Make sure you discuss any questions you have with your health care provider. Document Released: 09/18/2015 Document Revised: 05/11/2016 Document Reviewed: 06/23/2015 Elsevier Interactive Patient Education  2017 [place] Prevention in the Home Falls can cause injuries. They can happen to people of all ages. There are many things you can do to make your home safe and to help prevent falls. What can I do on the outside of my home?  Regularly fix the edges of walkways and driveways and fix any cracks.  Remove anything that might make you trip as you walk through a door, such as a raised step or threshold.  Trim any bushes or trees on the path to your home.  Use bright outdoor lighting.  Clear any walking paths of anything that might make someone trip, such as rocks or tools.  Regularly check to see if handrails are loose or broken. Make sure that both sides of any steps have handrails.  Any raised decks and porches should have guardrails on the edges.  Have any leaves, snow, or ice cleared regularly.  Use sand or salt on walking paths during winter.  Clean up any spills in your garage right away. This includes oil or grease spills. What can I do in the bathroom?  Use night lights.  Install grab bars by the toilet and in the tub and shower. Do not use towel bars as grab bars.  Use non-skid mats or decals in the tub or shower.  If you need to sit down in the shower, use a plastic, non-slip stool.  Keep the floor dry. Clean up any water that spills on the floor as soon as it happens.  Remove soap buildup in the tub or shower regularly.  Attach bath mats securely with double-sided non-slip rug tape.  Do not have throw rugs and other things on  the floor that can make you trip. What can I do in the bedroom?  Use night lights.  Make sure that you have a light by your bed that is easy to reach.  Do not use any sheets or blankets that are too big for your bed. They should not hang down onto the floor.  Have a firm chair that has side arms. You can use this for support while you get dressed.  Do not have throw rugs and other things on the floor that can make you trip. What can I do in the kitchen?  Clean up any spills right away.  Avoid walking on wet floors.  Keep items that you use a lot in easy-to-reach places.  If you need to reach something above you, use a strong step stool that has a grab bar.  Keep electrical cords out of the way.  Do not use floor polish or wax that makes floors slippery. If you must use wax, use non-skid floor wax.  Do not have throw rugs and other things on the floor that can make you trip. What can I do with my stairs?  Do  not leave any items on the stairs.  Make sure that there are handrails on both sides of the stairs and use them. Fix handrails that are broken or loose. Make sure that handrails are as long as the stairways.  Check any carpeting to make sure that it is firmly attached to the stairs. Fix any carpet that is loose or worn.  Avoid having throw rugs at the top or bottom of the stairs. If you do have throw rugs, attach them to the floor with carpet tape.  Make sure that you have a light switch at the top of the stairs and the bottom of the stairs. If you do not have them, ask someone to add them for you. What else can I do to help prevent falls?  Wear shoes that:  Do not have high heels.  Have rubber bottoms.  Are comfortable and fit you well.  Are closed at the toe. Do not wear sandals.  If you use a stepladder:  Make sure that it is fully opened. Do not climb a closed stepladder.  Make sure that both sides of the stepladder are locked into place.  Ask someone to  hold it for you, if possible.  Clearly mark and make sure that you can see:  Any grab bars or handrails.  First and last steps.  Where the edge of each step is.  Use tools that help you move around (mobility aids) if they are needed. These include:  Canes.  Walkers.  Scooters.  Crutches.  Turn on the lights when you go into a dark area. Replace any light bulbs as soon as they burn out.  Set up your furniture so you have a clear path. Avoid moving your furniture around.  If any of your floors are uneven, fix them.  If there are any pets around you, be aware of where they are.  Review your medicines with your doctor. Some medicines can make you feel dizzy. This can increase your chance of falling. Ask your doctor what other things that you can do to help prevent falls. This information is not intended to replace advice given to you by your health care provider. Make sure you discuss any questions you have with your health care provider. Document Released: 06/18/2009 Document Revised: 01/28/2016 Document Reviewed: 09/26/2014 Elsevier Interactive Patient Education  2017 [employer].

## 2017-01-06 NOTE — Progress Notes (Signed)
   I reviewed health advisor's note, was available for consultation and agree with the assessment and plan as written.    Mette Southgate L. Braxston Quinter, D.O. [facility] [address], [place] [address] Cell Phone (Mon-Fri 8am-5pm):  [phone] On Call:  [phone] & follow prompts after 5pm & weekends Office Phone:  [phone] Office Fax:  [phone]   Quick Notes   Health Maintenance: TDAP prescription sent to pharmacy. Levothyroxine refill sent over.   Abnormal Screen: MMSE 30/30. Passed clock drawing   Patient Concerns: none   Nurse Concerns: none

## 2017-01-16 ENCOUNTER — Other Ambulatory Visit: Payer: Self-pay | Admitting: Internal Medicine

## 2017-01-16 DIAGNOSIS — H3561 Retinal hemorrhage, right eye: Secondary | ICD-10-CM | POA: Diagnosis not present

## 2017-01-16 DIAGNOSIS — H26493 Other secondary cataract, bilateral: Secondary | ICD-10-CM | POA: Diagnosis not present

## 2017-01-16 LAB — HM DIABETES EYE EXAM

## 2017-05-17 DIAGNOSIS — R972 Elevated prostate specific antigen [PSA]: Secondary | ICD-10-CM | POA: Diagnosis not present

## 2017-05-24 DIAGNOSIS — N401 Enlarged prostate with lower urinary tract symptoms: Secondary | ICD-10-CM | POA: Diagnosis not present

## 2017-05-24 DIAGNOSIS — R972 Elevated prostate specific antigen [PSA]: Secondary | ICD-10-CM | POA: Diagnosis not present

## 2017-06-12 ENCOUNTER — Ambulatory Visit (INDEPENDENT_AMBULATORY_CARE_PROVIDER_SITE_OTHER): Payer: Medicare Other | Admitting: Internal Medicine

## 2017-06-12 ENCOUNTER — Encounter: Payer: Self-pay | Admitting: Internal Medicine

## 2017-06-12 VITALS — BP 128/70 | HR 72 | Temp 98.5°F | Wt 204.0 lb

## 2017-06-12 DIAGNOSIS — I10 Essential (primary) hypertension: Secondary | ICD-10-CM

## 2017-06-12 DIAGNOSIS — Z6831 Body mass index (BMI) 31.0-31.9, adult: Secondary | ICD-10-CM

## 2017-06-12 DIAGNOSIS — E119 Type 2 diabetes mellitus without complications: Secondary | ICD-10-CM

## 2017-06-12 DIAGNOSIS — N400 Enlarged prostate without lower urinary tract symptoms: Secondary | ICD-10-CM | POA: Diagnosis not present

## 2017-06-12 DIAGNOSIS — E039 Hypothyroidism, unspecified: Secondary | ICD-10-CM | POA: Diagnosis not present

## 2017-06-12 DIAGNOSIS — E6609 Other obesity due to excess calories: Secondary | ICD-10-CM

## 2017-06-12 NOTE — Progress Notes (Addendum)
Location:  [facility] clinic Provider: Fidelis Loth L. Mariea Clonts, D.O., C.M.D.  Code Status: full code Goals of Care:  Advanced Directives 06/12/2017  Does Patient Have a Medical Advance Directive? Yes  Type of Advance Directive [place]  Does patient want to make changes to medical advance directive? -  Copy of [place] in Chart? Yes   Chief Complaint  Patient presents with  . Acute Visit    increase in blood pressure, transfer to Dr. Mariea Clonts    HPI: Patient is a 76 y.o. male seen today for an acute visit for uncontrolled bp.  He will be transitioning to my care from Dr. Rolly Salter after his retirement.  BP here at goal.  Had three teeth cut out and the week before he had a gum infection.  BP also high at urologist.  Was very high before Harmon Pier called to get him an appt--160/95.  Has been watching what he's eating and exercising and the last two days, it's been as low as 124/71 this am.  He was taken off bp meds 2 years ago when he lost 70 lbs.  He did gain some back, but he's working on it again.    DMII:  hba1c coming down gradually.  It's in normal range at 5.4.  Metformin.     Hypothyroidism: on levothyroxine even before meeting Dr. Nyoka Cowden.  TSH was wnl 3.13 in Feb.    Follows with urologist:  PSA has come way down and leveling out.  Had a kidney tumor/renal cell ca in the past removed and sees Dr. Alinda Money.  That was 2012.  Had left partial nephrectomy.    Refuses flu shot--does plan to get it.  Just not today.    Saw ophthalmologist, Dr. [name] also.  Had cataract surgery last summer.  Cologuard done 9/17.  Due again 05/2019.  Past Medical History:  Diagnosis Date  . Back pain, lumbosacral 2014  . Cancer of kidney ([place]) 2012  . Cataract 2013  . Diabetes mellitus without complication ([place]) 2831  . Hypertension 2012  . Nephrolithiasis 2012  . Obesity, unspecified   . Other and unspecified hyperlipidemia 04/03/2013  . Pain, joint, ankle and foot   .  Unspecified hypothyroidism 2012    Past Surgical History:  Procedure Laterality Date  . CATARACT EXTRACTION W/ INTRAOCULAR LENS IMPLANT Bilateral 03/05/2016   Dr. [name]  . KIDNEY SURGERY  12/2010   had cancer removed; Dr. Alinda Money  . ROTATOR CUFF REPAIR     bilateral; Dr. [name]    No Known Allergies  Outpatient Encounter Prescriptions as of 06/12/2017  Medication Sig  . aspirin 81 MG tablet One daily to revent heart attack or stroke  . finasteride (PROSCAR) 5 MG tablet Take one tablet daily for prostate  . levothyroxine (SYNTHROID, LEVOTHROID) 50 MCG tablet Take 1 tablet (50 mcg total) by mouth daily before breakfast.  . metFORMIN (GLUCOPHAGE) 500 MG tablet TAKE 1 TABLET BY MOUTH TWICE DAILY TO CONTROL DIABETES  . ONE TOUCH ULTRA TEST test strip USE AS DIRECTED TO TEST ONCE DAILY  . ONETOUCH DELICA LANCETS FINE MISC USE TO CHECK BLOOD SUGAR ONCE DAILY   No facility-administered encounter medications on file as of 06/12/2017.     Review of Systems:  Review of Systems  Constitutional: Negative for chills and fever.  HENT: Negative for congestion and hearing loss.   Eyes: Negative for blurred vision.  Respiratory: Negative for cough and shortness of breath.   Cardiovascular: Negative for chest  pain.  Gastrointestinal: Negative for abdominal pain.  Genitourinary: Negative for dysuria, frequency, hematuria and urgency.  Musculoskeletal: Positive for back pain. Negative for falls and myalgias.  Neurological: Negative for dizziness.  Endo/Heme/Allergies:       Diabetes  Psychiatric/Behavioral: Negative for depression and memory loss. The patient is not nervous/anxious and does not have insomnia.     Health Maintenance  Topic Date Due  . TETANUS/TDAP  09/05/2013  . OPHTHALMOLOGY EXAM  02/27/2015  . PNA vac Low Risk Adult (2 of 2 - PPSV23) 06/05/2015  . INFLUENZA VACCINE  04/05/2017  . FOOT EXAM  04/05/2017  . HEMOGLOBIN A1C  04/06/2017  . URINE MICROALBUMIN  10/07/2017  .  Fecal DNA (Cologuard)  04/28/2019    Physical Exam: Vitals:   06/12/17 1352  BP: 128/70  Pulse: 72  Temp: 98.5 F (36.9 C)  TempSrc: Oral  SpO2: 97%  Weight: 204 lb (92.5 kg)   Body mass index is 32.93 kg/m. Physical Exam  Constitutional: He is oriented to person, place, and time. He appears well-developed and well-nourished. No distress.  HENT:  Head: Normocephalic.  Eyes:  glasses  Cardiovascular: Normal rate, regular rhythm, normal heart sounds and intact distal pulses.   Has visible spider vein where the lead was removed from his foot  Pulmonary/Chest: Effort normal and breath sounds normal. No respiratory distress.  Abdominal: Bowel sounds are normal.  Musculoskeletal: Normal range of motion.  Neurological: He is alert and oriented to person, place, and time.  Skin: Skin is warm and dry.  pale  Psychiatric: He has a normal mood and affect.    Labs reviewed: Basic Metabolic Panel:  Recent Labs  10/07/16 0001  NA 137  K 4.5  CL 101  CO2 25  GLUCOSE 86  BUN 16  CREATININE 0.93  CALCIUM 9.5  TSH 3.13   Liver Function Tests: No results for input(s): AST, ALT, ALKPHOS, BILITOT, PROT, ALBUMIN in the last 8760 hours. No results for input(s): LIPASE, AMYLASE in the last 8760 hours. No results for input(s): AMMONIA in the last 8760 hours. CBC: No results for input(s): WBC, NEUTROABS, HGB, HCT, MCV, PLT in the last 8760 hours. Lipid Panel:  Recent Labs  10/07/16 0001  CHOL 230*  HDL 50  LDLCALC 138*  TRIG 211*  CHOLHDL 4.6   Lab Results  Component Value Date   HGBA1C 5.4 10/07/2016    Assessment/Plan 1. Diabetes mellitus without complication ([place]) - controlled, counseled on diet and exercise, doing better lately - Hemoglobin A1c; Future  2. Essential hypertension -bp at goal, cont same regimen and avoid high sodium foods  - CBC with Differential/Platelet; Future - COMPLETE METABOLIC PANEL WITH GFR; Future  3. Hypothyroidism, unspecified  type -cont current levothyroxine and monitor - TSH; Future  4. Class 1 obesity due to excess calories without serious comorbidity with body mass index (BMI) of 31.0 to 31.9 in adult -cont to work on dietary changes and exercise, had lost a lot of weight and has kept most of it off, weight still above normal though - CBC with Differential/Platelet; Future - COMPLETE METABOLIC PANEL WITH GFR; Future - Hemoglobin A1c; Future - Lipid panel; Future - TSH; Future  5. Benign prostatic hyperplasia, unspecified whether lower urinary tract symptoms present -cont finasteride per urology and monitor -PSA has trended down -also followed there after partial nephrectomy for renal cell  Labs/tests ordered:   Orders Placed This Encounter  Procedures  . CBC with Differential/Platelet    Standing Status:  Future    Standing Expiration Date:   02/10/2018  . COMPLETE METABOLIC PANEL WITH GFR    Standing Status:   Future    Standing Expiration Date:   02/10/2018  . Hemoglobin A1c    Standing Status:   Future    Standing Expiration Date:   02/10/2018  . Lipid panel    Standing Status:   Future    Standing Expiration Date:   02/10/2018  . TSH    Standing Status:   Future    Standing Expiration Date:   02/10/2018  . HM DIABETES EYE EXAM    This external order was created through the Results Console.    Next appt: 10/16/2017 med mgt, labs before   Sadie Hazelett L. Nickole Adamek, D.O. [facility] [address], [place] [address] Cell Phone (Mon-Fri 8am-5pm):  [phone] On Call:  [phone] & follow prompts after 5pm & weekends Office Phone:  [phone] Office Fax:  [phone]

## 2017-06-12 NOTE — Patient Instructions (Signed)
Continue to work on a healthy diet and exercise. Also avoid high sodium foods.

## 2017-06-16 ENCOUNTER — Other Ambulatory Visit: Payer: Self-pay | Admitting: Internal Medicine

## 2017-09-15 ENCOUNTER — Other Ambulatory Visit: Payer: Self-pay | Admitting: Internal Medicine

## 2017-10-09 ENCOUNTER — Other Ambulatory Visit: Payer: Self-pay

## 2017-10-09 DIAGNOSIS — E6609 Other obesity due to excess calories: Secondary | ICD-10-CM

## 2017-10-09 DIAGNOSIS — Z6831 Body mass index (BMI) 31.0-31.9, adult: Secondary | ICD-10-CM

## 2017-10-09 DIAGNOSIS — I10 Essential (primary) hypertension: Secondary | ICD-10-CM

## 2017-10-09 DIAGNOSIS — E119 Type 2 diabetes mellitus without complications: Secondary | ICD-10-CM | POA: Diagnosis not present

## 2017-10-09 DIAGNOSIS — E039 Hypothyroidism, unspecified: Secondary | ICD-10-CM | POA: Diagnosis not present

## 2017-10-10 LAB — CBC WITH DIFFERENTIAL/PLATELET
Basophils Absolute: 77 cells/uL (ref 0–200)
Basophils Relative: 0.9 %
Eosinophils Absolute: 468 cells/uL (ref 15–500)
Eosinophils Relative: 5.5 %
HCT: 43.2 % (ref 38.5–50.0)
Hemoglobin: 14.4 g/dL (ref 13.2–17.1)
Lymphs Abs: 2253 cells/uL (ref 850–3900)
MCH: 28.2 pg (ref 27.0–33.0)
MCHC: 33.3 g/dL (ref 32.0–36.0)
MCV: 84.7 fL (ref 80.0–100.0)
MPV: 10.1 fL (ref 7.5–12.5)
Monocytes Relative: 6.8 %
Neutro Abs: 5126 cells/uL (ref 1500–7800)
Neutrophils Relative %: 60.3 %
Platelets: 292 10*3/uL (ref 140–400)
RBC: 5.1 10*6/uL (ref 4.20–5.80)
RDW: 13.6 % (ref 11.0–15.0)
Total Lymphocyte: 26.5 %
WBC mixed population: 578 cells/uL (ref 200–950)
WBC: 8.5 10*3/uL (ref 3.8–10.8)

## 2017-10-10 LAB — COMPLETE METABOLIC PANEL WITH GFR
AG Ratio: 1.6 (calc) (ref 1.0–2.5)
ALT: 17 U/L (ref 9–46)
AST: 21 U/L (ref 10–35)
Albumin: 3.9 g/dL (ref 3.6–5.1)
Alkaline phosphatase (APISO): 68 U/L (ref 40–115)
BUN: 15 mg/dL (ref 7–25)
CO2: 27 mmol/L (ref 20–32)
Calcium: 9.5 mg/dL (ref 8.6–10.3)
Chloride: 102 mmol/L (ref 98–110)
Creat: 1.1 mg/dL (ref 0.70–1.18)
GFR, Est African American: 76 mL/min/{1.73_m2} (ref >=60)
GFR, Est Non African American: 66 mL/min/{1.73_m2} (ref >=60)
Globulin: 2.4 g/dL (calc) (ref 1.9–3.7)
Glucose, Bld: 98 mg/dL (ref 65–99)
Potassium: 4.7 mmol/L (ref 3.5–5.3)
Sodium: 137 mmol/L (ref 135–146)
Total Bilirubin: 0.5 mg/dL (ref 0.2–1.2)
Total Protein: 6.3 g/dL (ref 6.1–8.1)

## 2017-10-10 LAB — HEMOGLOBIN A1C
Hgb A1c MFr Bld: 5.8 % of total Hgb — ABNORMAL HIGH (ref <5.7)
Mean Plasma Glucose: 120 (calc)
eAG (mmol/L): 6.6 (calc)

## 2017-10-10 LAB — LIPID PANEL
Cholesterol: 222 mg/dL — ABNORMAL HIGH (ref <200)
HDL: 47 mg/dL (ref >40)
LDL Cholesterol (Calc): 138 mg/dL (calc) — ABNORMAL HIGH
Non-HDL Cholesterol (Calc): 175 mg/dL (calc) — ABNORMAL HIGH (ref <130)
Total CHOL/HDL Ratio: 4.7 (calc) (ref <5.0)
Triglycerides: 227 mg/dL — ABNORMAL HIGH (ref <150)

## 2017-10-10 LAB — TSH: TSH: 2.29 mIU/L (ref 0.40–4.50)

## 2017-10-11 ENCOUNTER — Encounter: Payer: Self-pay | Admitting: *Deleted

## 2017-10-16 ENCOUNTER — Encounter: Payer: Self-pay | Admitting: Internal Medicine

## 2017-10-16 ENCOUNTER — Ambulatory Visit (INDEPENDENT_AMBULATORY_CARE_PROVIDER_SITE_OTHER): Payer: Medicare HMO | Admitting: Internal Medicine

## 2017-10-16 VITALS — BP 142/86 | HR 79 | Temp 97.4°F | Resp 10 | Ht 66.0 in | Wt 210.0 lb

## 2017-10-16 DIAGNOSIS — E785 Hyperlipidemia, unspecified: Secondary | ICD-10-CM

## 2017-10-16 DIAGNOSIS — E119 Type 2 diabetes mellitus without complications: Secondary | ICD-10-CM

## 2017-10-16 DIAGNOSIS — E039 Hypothyroidism, unspecified: Secondary | ICD-10-CM | POA: Diagnosis not present

## 2017-10-16 DIAGNOSIS — E1169 Type 2 diabetes mellitus with other specified complication: Secondary | ICD-10-CM | POA: Diagnosis not present

## 2017-10-16 DIAGNOSIS — Z6833 Body mass index (BMI) 33.0-33.9, adult: Secondary | ICD-10-CM

## 2017-10-16 DIAGNOSIS — E6609 Other obesity due to excess calories: Secondary | ICD-10-CM

## 2017-10-16 DIAGNOSIS — M7918 Myalgia, other site: Secondary | ICD-10-CM

## 2017-10-16 DIAGNOSIS — S61218A Laceration without foreign body of other finger without damage to nail, initial encounter: Secondary | ICD-10-CM | POA: Diagnosis not present

## 2017-10-16 DIAGNOSIS — I10 Essential (primary) hypertension: Secondary | ICD-10-CM | POA: Diagnosis not present

## 2017-10-16 DIAGNOSIS — L723 Sebaceous cyst: Secondary | ICD-10-CM | POA: Diagnosis not present

## 2017-10-16 DIAGNOSIS — Z23 Encounter for immunization: Secondary | ICD-10-CM | POA: Diagnosis not present

## 2017-10-16 MED ORDER — METFORMIN HCL 500 MG PO TABS: ORAL_TABLET | ORAL | 3 refills | Status: DC

## 2017-10-16 NOTE — Patient Instructions (Signed)
Calorie Counting for Weight Loss Calories are units of energy. Your body needs a certain amount of calories from food to keep you going throughout the day. When you eat more calories than your body needs, your body stores the extra calories as fat. When you eat fewer calories than your body needs, your body burns fat to get the energy it needs. Calorie counting means keeping track of how many calories you eat and drink each day. Calorie counting can be helpful if you need to lose weight. If you make sure to eat fewer calories than your body needs, you should lose weight. Ask your health care provider what a healthy weight is for you. For calorie counting to work, you will need to eat the right number of calories in a day in order to lose a healthy amount of weight per week. A dietitian can help you determine how many calories you need in a day and will give you suggestions on how to reach your calorie goal.  A healthy amount of weight to lose per week is usually 1-2 lb (0.5-0.9 kg). This usually means that your daily calorie intake should be reduced by 500-750 calories.  Eating 1,200 - 1,500 calories per day can help most women lose weight.  Eating 1,500 - 1,800 calories per day can help most men lose weight.  What is my plan? My goal is to have ___1500______ calories per day. If I have this many calories per day, I should lose around _____2_____ pounds per week. What do I need to know about calorie counting? In order to meet your daily calorie goal, you will need to:  Find out how many calories are in each food you would like to eat. Try to do this before you eat.  Decide how much of the food you plan to eat.  Write down what you ate and how many calories it had. Doing this is called keeping a food log.  To successfully lose weight, it is important to balance calorie counting with a healthy lifestyle that includes regular activity. Aim for 150 minutes of moderate exercise (such as walking) or  75 minutes of vigorous exercise (such as running) each week. Where do I find calorie information?  The number of calories in a food can be found on a Nutrition Facts label. If a food does not have a Nutrition Facts label, try to look up the calories online or ask your dietitian for help. Remember that calories are listed per serving. If you choose to have more than one serving of a food, you will have to multiply the calories per serving by the amount of servings you plan to eat. For example, the label on a package of bread might say that a serving size is 1 slice and that there are 90 calories in a serving. If you eat 1 slice, you will have eaten 90 calories. If you eat 2 slices, you will have eaten 180 calories. How do I keep a food log? Immediately after each meal, record the following information in your food log:  What you ate. Don't forget to include toppings, sauces, and other extras on the food.  How much you ate. This can be measured in cups, ounces, or number of items.  How many calories each food and drink had.  The total number of calories in the meal.  Keep your food log near you, such as in a small notebook in your pocket, or use a mobile app or website. Some  programs will calculate calories for you and show you how many calories you have left for the day to meet your goal. What are some calorie counting tips?  Use your calories on foods and drinks that will fill you up and not leave you hungry: ? Some examples of foods that fill you up are nuts and nut butters, vegetables, lean proteins, and high-fiber foods like whole grains. High-fiber foods are foods with more than 5 g fiber per serving. ? Drinks such as sodas, specialty coffee drinks, alcohol, and juices have a lot of calories, yet do not fill you up.  Eat nutritious foods and avoid empty calories. Empty calories are calories you get from foods or beverages that do not have many vitamins or protein, such as candy, sweets,  and soda. It is better to have a nutritious high-calorie food (such as an avocado) than a food with few nutrients (such as a bag of chips).  Know how many calories are in the foods you eat most often. This will help you calculate calorie counts faster.  Pay attention to calories in drinks. Low-calorie drinks include water and unsweetened drinks.  Pay attention to nutrition labels for "low fat" or "fat free" foods. These foods sometimes have the same amount of calories or more calories than the full fat versions. They also often have added sugar, starch, or salt, to make up for flavor that was removed with the fat.  Find a way of tracking calories that works for you. Get creative. Try different apps or programs if writing down calories does not work for you. What are some portion control tips?  Know how many calories are in a serving. This will help you know how many servings of a certain food you can have.  Use a measuring cup to measure serving sizes. You could also try weighing out portions on a kitchen scale. With time, you will be able to estimate serving sizes for some foods.  Take some time to put servings of different foods on your favorite plates, bowls, and cups so you know what a serving looks like.  Try not to eat straight from a bag or box. Doing this can lead to overeating. Put the amount you would like to eat in a cup or on a plate to make sure you are eating the right portion.  Use smaller plates, glasses, and bowls to prevent overeating.  Try not to multitask (for example, watch TV or use your computer) while eating. If it is time to eat, sit down at a table and enjoy your food. This will help you to know when you are full. It will also help you to be aware of what you are eating and how much you are eating. What are tips for following this plan? Reading food labels  Check the calorie count compared to the serving size. The serving size may be smaller than what you are used  to eating.  Check the source of the calories. Make sure the food you are eating is high in vitamins and protein and low in saturated and trans fats. Shopping  Read nutrition labels while you shop. This will help you make healthy decisions before you decide to purchase your food.  Make a grocery list and stick to it. Cooking  Try to cook your favorite foods in a healthier way. For example, try baking instead of frying.  Use low-fat dairy products. Meal planning  Use more fruits and vegetables. Half of your plate should  be fruits and vegetables.  Include lean proteins like poultry and fish. How do I count calories when eating out?  Ask for smaller portion sizes.  Consider sharing an entree and sides instead of getting your own entree.  If you get your own entree, eat only half. Ask for a box at the beginning of your meal and put the rest of your entree in it so you are not tempted to eat it.  If calories are listed on the menu, choose the lower calorie options.  Choose dishes that include vegetables, fruits, whole grains, low-fat dairy products, and lean protein.  Choose items that are boiled, broiled, grilled, or steamed. Stay away from items that are buttered, battered, fried, or served with cream sauce. Items labeled "crispy" are usually fried, unless stated otherwise.  Choose water, low-fat milk, unsweetened iced tea, or other drinks without added sugar. If you want an alcoholic beverage, choose a lower calorie option such as a glass of wine or light beer.  Ask for dressings, sauces, and syrups on the side. These are usually high in calories, so you should limit the amount you eat.  If you want a salad, choose a garden salad and ask for grilled meats. Avoid extra toppings like bacon, cheese, or fried items. Ask for the dressing on the side, or ask for olive oil and vinegar or lemon to use as dressing.  Estimate how many servings of a food you are given. For example, a serving  of cooked rice is  cup or about the size of half a baseball. Knowing serving sizes will help you be aware of how much food you are eating at restaurants. The list below tells you how big or small some common portion sizes are based on everyday objects: ? 1 oz-4 stacked dice. ? 3 oz-1 deck of cards. ? 1 tsp-1 die. ? 1 Tbsp- a ping-pong ball. ? 2 Tbsp-1 ping-pong ball. ?  cup- baseball. ? 1 cup-1 baseball. Summary  Calorie counting means keeping track of how many calories you eat and drink each day. If you eat fewer calories than your body needs, you should lose weight.  A healthy amount of weight to lose per week is usually 1-2 lb (0.5-0.9 kg). This usually means reducing your daily calorie intake by 500-750 calories.  The number of calories in a food can be found on a Nutrition Facts label. If a food does not have a Nutrition Facts label, try to look up the calories online or ask your dietitian for help.  Use your calories on foods and drinks that will fill you up, and not on foods and drinks that will leave you hungry.  Use smaller plates, glasses, and bowls to prevent overeating. This information is not intended to replace advice given to you by your health care provider. Make sure you discuss any questions you have with your health care provider. Document Released: 08/22/2005 Document Revised: 07/22/2016 Document Reviewed: 07/22/2016 Elsevier Interactive Patient Education  2018 [employer]. Preventing Unhealthy [employer], Adult Staying at a healthy weight is important. When fat builds up in your body, you may become overweight or obese. These conditions put you at greater risk for developing certain health problems, such as heart disease, diabetes, sleeping problems, joint problems, and some cancers. Unhealthy weight gain is often the result of making unhealthy choices in what you eat. It is also a result of not getting enough exercise. You can make changes to your lifestyle to  prevent obesity and  and stay as healthy as possible. What nutrition changes can be made? To maintain a healthy weight and prevent obesity:  Eat only as much as your body needs. To do this: ? Pay attention to signs that you are hungry or full. Stop eating as soon as you feel full. ? If you feel hungry, try drinking water first. Drink enough water so your urine is clear or pale yellow. ? Eat smaller portions. ? Look at serving sizes on food labels. Most foods contain more than one serving per container. ? Eat the recommended amount of calories for your gender and activity level. While most active people should eat around 2,000 calories per day, if you are trying to lose weight or are not very active, you main need to eat less calories. Talk to your health care provider or dietitian about how many calories you should eat each day.  Choose healthy foods, such as: ? Fruits and vegetables. Try to fill at least half of your plate at each meal with fruits and vegetables. ? Whole grains, such as whole wheat bread, brown rice, and quinoa. ? Lean meats, such as chicken or fish. ? Other healthy proteins, such as beans, eggs, or tofu. ? Healthy fats, such as nuts, seeds, fatty fish, and olive oil. ? Low-fat or fat-free dairy.  Check food labels and avoid food and drinks that: ? Are high in calories. ? Have added sugar. ? Are high in sodium. ? Have saturated fats or trans fats.  Limit how much you eat of the following foods: ? Prepackaged meals. ? Fast food. ? Fried foods. ? Processed meat, such as bacon, sausage, and deli meats. ? Fatty cuts of red meat and poultry with skin.  Cook foods in healthier ways, such as by baking, broiling, or grilling.  When grocery shopping, try to shop around the outside of the store. This helps you buy mostly fresh foods and avoid canned and prepackaged foods.  What lifestyle changes can be made?  Exercise at least 30 minutes 5 or more days each week. Exercising  includes brisk walking, yard work, biking, running, swimming, and team sports like basketball and soccer. Ask your health care provider which exercises are safe for you.  Do not use any products that contain nicotine or tobacco, such as cigarettes and e-cigarettes. If you need help quitting, ask your health care provider.  Limit alcohol intake to no more than 1 drink a day for nonpregnant women and 2 drinks a day for men. One drink equals 12 oz of beer, 5 oz of wine, or 1 oz of hard liquor.  Try to get 7-9 hours of sleep each night. What other changes can be made?  Keep a food and activity journal to keep track of: ? What you ate and how many calories you had. Remember to count sauces, dressings, and side dishes. ? Whether you were active, and what exercises you did. ? Your calorie, weight, and activity goals.  Check your weight regularly. Track any changes. If you notice you have gained weight, make changes to your diet or activity routine.  Avoid taking weight-loss medicines or supplements. Talk to your health care provider before starting any new medicine or supplement.  Talk to your health care provider before trying any new diet or exercise plan. Why are these changes important? Eating healthy, staying active, and having healthy habits not only help prevent obesity, they also:  Help you to manage stress and emotions.  Help you to connect with   and family.  Improve your self-esteem.  Improve your sleep.  Prevent long-term health problems.  What can happen if changes are not made? Being obese or overweight can cause you to develop joint or bone problems, which can make it hard for you to stay active or do activities you enjoy. Being obese or overweight also puts stress on your heart and lungs and can lead to health problems like diabetes, heart disease, and some cancers. Where to find more information: Talk with your health care provider or a dietitian about healthy  eating and healthy lifestyle choices. You may also find other information through these resources:  [place] Department of Agriculture MyPlate: FormerB[employer].no  American Heart Association: www.heart.org  Centers for Disease Control and Prevention: http://www.wolf.info/  Summary  Staying at a healthy weight is important. It helps prevent certain diseases and health problems, such as heart disease, diabetes, joint problems, sleep disorders, and some cancers.  Being obese or overweight can cause you to develop joint or bone problems, which can make it hard for you to stay active or do activities you enjoy.  You can prevent unhealthy weight gain by eating a healthy diet, exercising regularly, not smoking, limiting alcohol, and getting enough sleep.  Talk with your health care provider or a dietitian for guidance about healthy eating and healthy lifestyle choices. This information is not intended to replace advice given to you by your health care provider. Make sure you discuss any questions you have with your health care provider. Document Released: 08/23/2016 Document Revised: 09/28/2016 Document Reviewed: 09/28/2016 Elsevier Interactive Patient Education  2018 [employer]. High Cholesterol High cholesterol is a condition in which the blood has high levels of a white, waxy, fat-like substance (cholesterol). The human body needs small amounts of cholesterol. The liver makes all the cholesterol that the body needs. Extra (excess) cholesterol comes from the food that we eat. Cholesterol is carried from the liver by the blood through the blood vessels. If you have high cholesterol, deposits (plaques) may build up on the walls of your blood vessels (arteries). Plaques make the arteries narrower and stiffer. Cholesterol plaques increase your risk for heart attack and stroke. Work with your health care provider to keep your cholesterol levels in a healthy range. What increases the risk? This condition is  more likely to develop in people who:  Eat foods that are high in animal fat (saturated fat) or cholesterol.  Are overweight.  Are not getting enough exercise.  Have a family history of high cholesterol.  What are the signs or symptoms? There are no symptoms of this condition. How is this diagnosed? This condition may be diagnosed from the results of a blood test.  If you are older than age 64, your health care provider may check your cholesterol every 4-6 years.  You may be checked more often if you already have high cholesterol or other risk factors for heart disease.  The blood test for cholesterol measures:  "Bad" cholesterol (LDL cholesterol). This is the main type of cholesterol that causes heart disease. The desired level for LDL is less than 100.  "Good" cholesterol (HDL cholesterol). This type helps to protect against heart disease by cleaning the arteries and carrying the LDL away. The desired level for HDL is 60 or higher.  Triglycerides. These are fats that the body can store or burn for energy. The desired number for triglycerides is lower than 150.  Total cholesterol. This is a measure of the total amount of cholesterol in  your blood, including LDL cholesterol, HDL cholesterol, and triglycerides. A healthy number is less than 200.  How is this treated? This condition is treated with diet changes, lifestyle changes, and medicines. Diet changes  This may include eating more whole grains, fruits, vegetables, nuts, and fish.  This may also include cutting back on red meat and foods that have a lot of added sugar. Lifestyle changes  Changes may include getting at least 40 minutes of aerobic exercise 3 times a week. Aerobic exercises include walking, biking, and swimming. Aerobic exercise along with a healthy diet can help you maintain a healthy weight.  Changes may also include quitting smoking. Medicines  Medicines are usually given if diet and lifestyle changes  have failed to reduce your cholesterol to healthy levels.  Your health care provider may prescribe a statin medicine. Statin medicines have been shown to reduce cholesterol, which can reduce the risk of heart disease. Follow these instructions at home: Eating and drinking  If told by your health care provider:  Eat chicken (without skin), fish, veal, shellfish, ground [place] breast, and round or loin cuts of red meat.  Do not eat fried foods or fatty meats, such as hot dogs and salami.  Eat plenty of fruits, such as apples.  Eat plenty of vegetables, such as broccoli, potatoes, and carrots.  Eat beans, peas, and lentils.  Eat grains such as barley, rice, couscous, and bulgur wheat.  Eat pasta without cream sauces.  Use skim or nonfat milk, and eat low-fat or nonfat yogurt and cheeses.  Do not eat or drink whole milk, cream, ice cream, egg yolks, or hard cheeses.  Do not eat stick margarine or tub margarines that contain trans fats (also called partially hydrogenated oils).  Do not eat saturated tropical oils, such as coconut oil and palm oil.  Do not eat cakes, cookies, crackers, or other baked goods that contain trans fats.  General instructions  Exercise as directed by your health care provider. Increase your activity level with activities such as gardening, walking, and taking the stairs.  Take over-the-counter and prescription medicines only as told by your health care provider.  Do not use any products that contain nicotine or tobacco, such as cigarettes and e-cigarettes. If you need help quitting, ask your health care provider.  Keep all follow-up visits as told by your health care provider. This is important. Contact a health care provider if:  You are struggling to maintain a healthy diet or weight.  You need help to start on an exercise program.  You need help to stop smoking. Get help right away if:  You have chest pain.  You have trouble breathing. This  information is not intended to replace advice given to you by your health care provider. Make sure you discuss any questions you have with your health care provider. Document Released: 08/22/2005 Document Revised: 03/19/2016 Document Reviewed: 02/20/2016 Elsevier Interactive Patient Education  [employer].

## 2017-10-16 NOTE — Progress Notes (Signed)
Location:  [facility] clinic Provider:  Kaliopi Blyden L. Mariea Clonts, D.O., C.M.D.  Code Status: need to discuss Goals of Care:  Advanced Directives 10/16/2017  Does Patient Have a Medical Advance Directive? Yes  Type of Paramedic of [place];Living will  Does patient want to make changes to medical advance directive? -  Copy of [place] in Chart? Yes     Chief Complaint  Patient presents with  . Medical Management of Chronic Issues    4 month follow-up, cut finger (left hand), patient questions if he can get a TD vaccine update. Patient requesting exam of back- raised area   . Medication Refill    Refill Metformin- 90 day supply   . Health Maintenance    Foot Exam due, discuss need for pneumonia and shingles vaccine   . Advance Care Planning    Living Will and HPOA    HPI: Patient is a 77 y.o. male seen today for medical management of chronic diseases.    He cut his finger with the pocket knife (left index) that his son had just sharpened for him--was opening an ice cream container.  Happened Friday.  Hurt then, just a little sore now.  Put polysporin on there.  Needs an updated Td vaccine now.  Sugar average creeped back up to 5.8 in the prediabetic range (had been 5.4).  Reports holiday eating.  Weight also crept up--6 lbs.  Bad cholesterol was 138 and should be less than 70.  HDL trending down. He tries to walk a minimum of 30 mins per day.  Has walked as much as 90 mins total.    BP also elevated today.  He hurt his back over the weekend and has bene putting muscle rub on his hip.  He says pain was pretty bad, but better now today.  Was loading stuff into his camper.  Says he forgot his age.    He has a knot on his back that was raised.  Had a lot of Dr. Rolly Salter paperwork and he forgets the name.    Pneumonia is up to date.  Got flu shot in September.    Past Medical History:  Diagnosis Date  . Back pain, lumbosacral 2014  . Cancer of kidney  ([place]) 2012  . Cataract 2013  . Diabetes mellitus without complication ([place]) 9622  . Hypertension 2012  . Nephrolithiasis 2012  . Obesity, unspecified   . Other and unspecified hyperlipidemia 04/03/2013  . Pain, joint, ankle and foot   . Unspecified hypothyroidism 2012    Past Surgical History:  Procedure Laterality Date  . CATARACT EXTRACTION W/ INTRAOCULAR LENS IMPLANT Bilateral 03/05/2016   Dr. [name]  . KIDNEY SURGERY  12/2010   had cancer removed; Dr. Alinda Money  . ROTATOR CUFF REPAIR     bilateral; Dr. [name]    No Known Allergies  Outpatient Encounter Medications as of 10/16/2017  Medication Sig  . aspirin 81 MG tablet One daily to revent heart attack or stroke  . finasteride (PROSCAR) 5 MG tablet Take one tablet daily for prostate  . levothyroxine (SYNTHROID, LEVOTHROID) 50 MCG tablet TAKE 1 TABLET(50 MCG) BY MOUTH DAILY BEFORE BREAKFAST  . metFORMIN (GLUCOPHAGE) 500 MG tablet TAKE 1 TABLET BY MOUTH TWICE DAILY TO CONTROL DIABETES  . ONE TOUCH ULTRA TEST test strip USE AS DIRECTED TO TEST ONCE DAILY  . ONETOUCH DELICA LANCETS FINE MISC USE TO CHECK BLOOD SUGAR ONCE DAILY   No facility-administered encounter medications on file  as of 10/16/2017.     Review of Systems:  Review of Systems  Constitutional: Negative for chills, fever and malaise/fatigue.  HENT: Negative for congestion.   Eyes: Negative for blurred vision.  Respiratory: Negative for cough and shortness of breath.   Cardiovascular: Negative for chest pain, palpitations and leg swelling.  Gastrointestinal: Negative for abdominal pain, blood in stool, constipation and melena.  Genitourinary: Negative for dysuria.  Musculoskeletal: Positive for back pain and myalgias. Negative for falls and joint pain.  Skin: Negative for itching and rash.  Neurological: Negative for dizziness, sensory change and weakness.  Endo/Heme/Allergies: Does not bruise/bleed easily.  Psychiatric/Behavioral: Negative for depression and  memory loss.    Health Maintenance  Topic Date Due  . TETANUS/TDAP  09/05/2013  . PNA vac Low Risk Adult (2 of 2 - PPSV23) 06/05/2015  . FOOT EXAM  04/05/2017  . URINE MICROALBUMIN  10/07/2017  . OPHTHALMOLOGY EXAM  01/16/2018  . HEMOGLOBIN A1C  04/08/2018  . Fecal DNA (Cologuard)  04/28/2019  . INFLUENZA VACCINE  Completed    Physical Exam: Vitals:   10/16/17 1258  BP: (!) 142/86  Pulse: 79  Resp: 10  Temp: (!) 97.4 F (36.3 C)  TempSrc: Oral  SpO2: 95%  Weight: 210 lb (95.3 kg)  Height: 5\' 6"  (1.676 m)   Body mass index is 33.89 kg/m. Physical Exam  Constitutional: He is oriented to person, place, and time. He appears well-developed and well-nourished. No distress.  HENT:  Head: Normocephalic and atraumatic.  Cardiovascular: Normal rate, regular rhythm, normal heart sounds and intact distal pulses.  Pulmonary/Chest: Effort normal and breath sounds normal. No respiratory distress.  Abdominal: Bowel sounds are normal.  Musculoskeletal: Normal range of motion.  Neurological: He is alert and oriented to person, place, and time.  Skin: Skin is warm and dry. Capillary refill takes less than 2 seconds. There is pallor.  About 1/2dollar sized cyst, no erythema, warmth, drainage on right upper back  Psychiatric: He has a normal mood and affect.   Labs reviewed: Basic Metabolic Panel: Recent Labs    10/09/17 0814  NA 137  K 4.7  CL 102  CO2 27  GLUCOSE 98  BUN 15  CREATININE 1.10  CALCIUM 9.5  TSH 2.29   Liver Function Tests: Recent Labs    10/09/17 0814  AST 21  ALT 17  BILITOT 0.5  PROT 6.3   No results for input(s): LIPASE, AMYLASE in the last 8760 hours. No results for input(s): AMMONIA in the last 8760 hours. CBC: Recent Labs    10/09/17 0814  WBC 8.5  NEUTROABS 5,126  HGB 14.4  HCT 43.2  MCV 84.7  PLT 292   Lipid Panel: Recent Labs    10/09/17 0814  CHOL 222*  HDL 47  TRIG 227*  CHOLHDL 4.7   Lab Results  Component Value Date    HGBA1C 5.8 (H) 10/09/2017    Assessment/Plan 1. Diabetes mellitus without complication ([place]) - cont current regimen, but work on improving diet and exercising longer to lose weight - metFORMIN (GLUCOPHAGE) 500 MG tablet; TAKE 1 TABLET BY MOUTH TWICE DAILY TO CONTROL DIABETES  Dispense: 180 tablet; Refill: 3 - CBC with Differential/Platelet; Future - COMPLETE METABOLIC PANEL WITH GFR; Future - Hemoglobin A1c; Future - Lipid panel; Future  2. Sebaceous cyst -on his right upper back, nontender, no erythema, warmth, but he just forgot what Dr. Nyoka Cowden told him it was and wanted me to see it -monitor  3. Hypothyroidism, unspecified  type -cont levothyroxine as ordered   4. Essential hypertension -bp at goal so continue current regimen  5. Class 1 obesity due to excess calories with serious comorbidity and body mass index (BMI) of 33.0 to 33.9 in adult - has been gaining weight back--given 3 different literature documents to help with weight loss - COMPLETE METABOLIC PANEL WITH GFR; Future - Lipid panel; Future  6. Laceration of finger, index, initial encounter -appears to be healing, but needs td vaccine -given return precautions  7. Need for Td vaccine Td given  8. Lumbar muscle pain -from lifting items over the weekend, had sciatica down right leg that is now improving  9. Hyperlipidemia associated with type 2 diabetes mellitus ([place]) - refuses meds, wants to improve his diet - Lipid panel; Future  Labs/tests ordered:   Orders Placed This Encounter  Procedures  . Td vaccine greater than or equal to 7yo preservative free IM  . CBC with Differential/Platelet    Standing Status:   Future    Standing Expiration Date:   06/15/2018  . COMPLETE METABOLIC PANEL WITH GFR    Standing Status:   Future    Standing Expiration Date:   06/15/2018  . Hemoglobin A1c    Standing Status:   Future    Standing Expiration Date:   06/15/2018  . Lipid panel    Standing Status:   Future     Standing Expiration Date:   06/15/2018    Next appt:  02/12/2018 med mgt, labs before  [facility] L. Shantella Blubaugh, D.O. [facility] [address], [place] [address] Cell Phone (Mon-Fri 8am-5pm):  [phone] On Call:  [phone] & follow prompts after 5pm & weekends Office Phone:  [phone] Office Fax:  [phone]

## 2017-10-30 ENCOUNTER — Ambulatory Visit (INDEPENDENT_AMBULATORY_CARE_PROVIDER_SITE_OTHER): Payer: Medicare HMO | Admitting: Internal Medicine

## 2017-10-30 ENCOUNTER — Encounter: Payer: Self-pay | Admitting: Internal Medicine

## 2017-10-30 VITALS — BP 152/70 | HR 74 | Temp 98.2°F

## 2017-10-30 DIAGNOSIS — L089 Local infection of the skin and subcutaneous tissue, unspecified: Secondary | ICD-10-CM

## 2017-10-30 DIAGNOSIS — L723 Sebaceous cyst: Secondary | ICD-10-CM | POA: Diagnosis not present

## 2017-10-30 MED ORDER — DOXYCYCLINE HYCLATE 100 MG PO TABS: 100.0000 mg | ORAL_TABLET | Freq: Two times a day (BID) | ORAL | 0 refills | Status: DC

## 2017-10-30 NOTE — Progress Notes (Signed)
Location:  [facility] clinic Provider: Thereasa Iannello L. Mariea Clonts, D.O., C.M.D.  Code Status: full code Goals of Care:  Advanced Directives 10/16/2017  Does Patient Have a Medical Advance Directive? Yes  Type of Paramedic of [place];Living will  Does patient want to make changes to medical advance directive? -  Copy of [place] in Chart? Yes     Chief Complaint  Patient presents with  . Acute Visit    cyst    HPI: Patient is a 77 y.o. male seen today for an acute visit for reddening and pain in the cyst noted during his last appt on his mid upper back.  It's bothering him when he sits in his truck.  He wants it drained.  Past Medical History:  Diagnosis Date  . Back pain, lumbosacral 2014  . Cancer of kidney ([place]) 2012  . Cataract 2013  . Diabetes mellitus without complication ([place]) 1660  . Hypertension 2012  . Nephrolithiasis 2012  . Obesity, unspecified   . Other and unspecified hyperlipidemia 04/03/2013  . Pain, joint, ankle and foot   . Unspecified hypothyroidism 2012    Past Surgical History:  Procedure Laterality Date  . CATARACT EXTRACTION W/ INTRAOCULAR LENS IMPLANT Bilateral 03/05/2016   Dr. [name]  . KIDNEY SURGERY  12/2010   had cancer removed; Dr. Alinda Money  . ROTATOR CUFF REPAIR     bilateral; Dr. [name]    No Known Allergies  Outpatient Encounter Medications as of 10/30/2017  Medication Sig  . aspirin 81 MG tablet One daily to revent heart attack or stroke  . doxycycline (VIBRA-TABS) 100 MG tablet Take 1 tablet (100 mg total) by mouth 2 (two) times daily.  . finasteride (PROSCAR) 5 MG tablet Take one tablet daily for prostate  . levothyroxine (SYNTHROID, LEVOTHROID) 50 MCG tablet TAKE 1 TABLET(50 MCG) BY MOUTH DAILY BEFORE BREAKFAST  . metFORMIN (GLUCOPHAGE) 500 MG tablet TAKE 1 TABLET BY MOUTH TWICE DAILY TO CONTROL DIABETES  . ONE TOUCH ULTRA TEST test strip USE AS DIRECTED TO TEST ONCE DAILY  . ONETOUCH DELICA LANCETS  FINE MISC USE TO CHECK BLOOD SUGAR ONCE DAILY   No facility-administered encounter medications on file as of 10/30/2017.     Review of Systems:  Review of Systems  Constitutional: Negative for chills, fever and malaise/fatigue.  Musculoskeletal: Negative for falls.  Skin:       Reddening of area at bottom of cyst, more tender  Neurological: Negative for weakness.    Health Maintenance  Topic Date Due  . PNA vac Low Risk Adult (2 of 2 - PPSV23) 06/05/2015  . FOOT EXAM  04/05/2017  . URINE MICROALBUMIN  10/07/2017  . OPHTHALMOLOGY EXAM  01/16/2018  . HEMOGLOBIN A1C  04/08/2018  . Fecal DNA (Cologuard)  04/28/2019  . TETANUS/TDAP  10/17/2027  . INFLUENZA VACCINE  Completed    Physical Exam: Vitals:   10/30/17 1642  BP: (!) 152/70  Pulse: 74  Temp: 98.2 F (36.8 C)  TempSrc: Oral  SpO2: 96%   There is no height or weight on file to calculate BMI. Physical Exam  Constitutional: He is oriented to person, place, and time. He appears well-developed and well-nourished. No distress.  Neurological: He is alert and oriented to person, place, and time.  Skin:  Cyst about the size of a clementine on middle of upper back; erythema at base, increased tenderness    Labs reviewed: Basic Metabolic Panel: Recent Labs    10/09/17 [phone]  NA 137  K 4.7  CL 102  CO2 27  GLUCOSE 98  BUN 15  CREATININE 1.10  CALCIUM 9.5  TSH 2.29   Liver Function Tests: Recent Labs    10/09/17 0814  AST 21  ALT 17  BILITOT 0.5  PROT 6.3   No results for input(s): LIPASE, AMYLASE in the last 8760 hours. No results for input(s): AMMONIA in the last 8760 hours. CBC: Recent Labs    10/09/17 0814  WBC 8.5  NEUTROABS 5,126  HGB 14.4  HCT 43.2  MCV 84.7  PLT 292   Lipid Panel: Recent Labs    10/09/17 0814  CHOL 222*  HDL 47  TRIG 227*  CHOLHDL 4.7   Lab Results  Component Value Date   HGBA1C 5.8 (H) 10/09/2017   Assessment/Plan 1. Infected sebaceous cyst of skin - informed  consent obtained -area was cleansed with betadine, then sprayed with ethyl chloride, injected at 4 locations with a total of 2cc of 1% lidocaine, then an incision was made with a scalpel vertically over the cyst, pressure was applied on either side and a large amount of thick, foul-smelling, yellow and bloody material was removed -at the end of the process, the area was flat -area was cleaned with alcohol and dressing applied over wound - doxycycline (VIBRA-TABS) 100 MG tablet; Take 1 tablet (100 mg total) by mouth 2 (two) times daily.  Dispense: 20 tablet; Refill: 0 -instructions provided to take abx with yogurt for full course -change dressing daily -notify us if increased redness, warmth or fever develop  Labs/tests ordered: none Next appt:  02/05/2018  Alyx Gee L. Chakita Mcgraw, D.O. [facility] [address], [place] [address] Cell Phone (Mon-Fri 8am-5pm):  [phone] On Call:  [phone] & follow prompts after 5pm & weekends Office Phone:  [phone] Office Fax:  [phone]

## 2017-10-30 NOTE — Patient Instructions (Signed)
Please change your dressing daily or as needed for soiling. Take the antibiotic (doxycycline) twice a day for 10 days--be sure to finish the course.   I recommend eating a yogurt daily while you take the antibiotics and hydrate well.

## 2017-10-31 ENCOUNTER — Telehealth: Payer: Self-pay

## 2017-10-31 MED ORDER — CEPHALEXIN 250 MG PO CAPS: 250.0000 mg | ORAL_CAPSULE | Freq: Four times a day (QID) | ORAL | 0 refills | Status: AC

## 2017-10-31 NOTE — Telephone Encounter (Signed)
Patient's wife, Harmon Pier, called to ask how many days must patient keep cyst covered with a dressing. Harmon Pier stated that she changed dressing last night and there was bloody drainage on dressing but dressing is causing patient to itch.   Harmon Pier would also stated that doxycycline cost $47 and they cannot afford this. She asked if there was a less expensive alternative antibiotic that could be called in. I asked if she knew which medications were covered by her insurance on a lower tier but she could not answer this.   Harmon Pier would like a call back as soon as possible due to patient leaving home soon.

## 2017-10-31 NOTE — Telephone Encounter (Signed)
New rx sent in, Spoke with patient's wife and advised results rx sent to pharmacy by e-script

## 2017-11-03 HISTORY — PX: CYST REMOVAL TRUNK: SHX6283

## 2017-11-06 ENCOUNTER — Ambulatory Visit (INDEPENDENT_AMBULATORY_CARE_PROVIDER_SITE_OTHER): Payer: Medicare HMO | Admitting: Nurse Practitioner

## 2017-11-06 ENCOUNTER — Encounter: Payer: Self-pay | Admitting: Nurse Practitioner

## 2017-11-06 ENCOUNTER — Ambulatory Visit: Payer: Medicare HMO | Admitting: Internal Medicine

## 2017-11-06 VITALS — BP 142/70 | HR 81 | Temp 98.5°F | Ht 66.0 in | Wt 209.0 lb

## 2017-11-06 DIAGNOSIS — L723 Sebaceous cyst: Secondary | ICD-10-CM

## 2017-11-06 DIAGNOSIS — L089 Local infection of the skin and subcutaneous tissue, unspecified: Secondary | ICD-10-CM

## 2017-11-06 MED ORDER — SULFAMETHOXAZOLE-TRIMETHOPRIM 800-160 MG PO TABS: 1.0000 | ORAL_TABLET | Freq: Two times a day (BID) | ORAL | 0 refills | Status: DC

## 2017-11-06 NOTE — Patient Instructions (Addendum)
To use warm compress twice daily Clean with soap and water daily  To change dressing twice daily  To start bactrim DS by mouth twice daily Stop keflex  Follow up in 7-10 days

## 2017-11-06 NOTE — Progress Notes (Signed)
Careteam: Patient Care Team: Gayland Curry, DO as PCP - General (Geriatric Medicine) [name], MD as Consulting Physician (Urology) [name], MD as Consulting Physician (Ophthalmology) [name], MD as Consulting Physician (Ophthalmology)  Advanced Directive information    No Known Allergies  Chief Complaint  Patient presents with  . Acute Visit    Pt is being seen for a wound check of sebaceous cyst on mid-upper back. Cyst was drained on 10/30/17 and pt reports that wound is still draining. Pt is still taking antibiotics for 1 more day.      HPI: Patient is a 77 y.o. male seen in the office today for check of sebaceous cyst that was drained on 10/30/17. Reports it is still draining and tender. Continues to have foul odor. No fever or chills Changing dressing about every day or so.   Review of Systems:  Review of Systems  Constitutional: Negative for chills, fever and malaise/fatigue.  Musculoskeletal: Negative for falls.  Skin:       Reddening of area around incision, tender with drainage.   Neurological: Negative for weakness.    Past Medical History:  Diagnosis Date  . Back pain, lumbosacral 2014  . Cancer of kidney ([place]) 2012  . Cataract 2013  . Diabetes mellitus without complication ([place]) 6503  . Hypertension 2012  . Nephrolithiasis 2012  . Obesity, unspecified   . Other and unspecified hyperlipidemia 04/03/2013  . Pain, joint, ankle and foot   . Unspecified hypothyroidism 2012   Past Surgical History:  Procedure Laterality Date  . CATARACT EXTRACTION W/ INTRAOCULAR LENS IMPLANT Bilateral 03/05/2016   Dr. [name]  . KIDNEY SURGERY  12/2010   had cancer removed; Dr. Alinda Money  . ROTATOR CUFF REPAIR     bilateral; Dr. [name]   Social History:   reports that  has never smoked. he has never used smokeless tobacco. He reports that he does not drink alcohol or use drugs.  No family history on file.  Medications: Patient's Medications  New  Prescriptions   No medications on file  Previous Medications   ASPIRIN 81 MG TABLET    One daily to revent heart attack or stroke   CEPHALEXIN (KEFLEX) 250 MG CAPSULE    Take 1 capsule (250 mg total) by mouth 4 (four) times daily for 7 days.   FINASTERIDE (PROSCAR) 5 MG TABLET    Take one tablet daily for prostate   LEVOTHYROXINE (SYNTHROID, LEVOTHROID) 50 MCG TABLET    TAKE 1 TABLET(50 MCG) BY MOUTH DAILY BEFORE BREAKFAST   METFORMIN (GLUCOPHAGE) 500 MG TABLET    TAKE 1 TABLET BY MOUTH TWICE DAILY TO CONTROL DIABETES   ONE TOUCH ULTRA TEST TEST STRIP    USE AS DIRECTED TO TEST ONCE DAILY   ONETOUCH DELICA LANCETS FINE MISC    USE TO CHECK BLOOD SUGAR ONCE DAILY  Modified Medications   No medications on file  Discontinued Medications   No medications on file     Physical Exam:  Vitals:   11/06/17 1542  BP: (!) 142/70  Pulse: 81  Temp: 98.5 F (36.9 C)  SpO2: 99%  Weight: 209 lb (94.8 kg)  Height: 5\' 6"  (1.676 m)   Body mass index is 33.73 kg/m.  Physical Exam  Constitutional: He is oriented to person, place, and time. He appears well-developed and well-nourished. No distress.  Cardiovascular: Normal rate and regular rhythm.  Pulmonary/Chest: Effort normal and breath sounds normal.  Neurological: He is alert and oriented to  person, place, and time.  Skin:  Redness around incision from cyst removal to middle of upper back; erythema at base, increased tenderness, bloody drainage with pieces of purulent drainage noted    Labs reviewed: Basic Metabolic Panel: Recent Labs    10/09/17 0814  NA 137  K 4.7  CL 102  CO2 27  GLUCOSE 98  BUN 15  CREATININE 1.10  CALCIUM 9.5  TSH 2.29   Liver Function Tests: Recent Labs    10/09/17 0814  AST 21  ALT 17  BILITOT 0.5  PROT 6.3   No results for input(s): LIPASE, AMYLASE in the last 8760 hours. No results for input(s): AMMONIA in the last 8760 hours. CBC: Recent Labs    10/09/17 0814  WBC 8.5  NEUTROABS 5,126    HGB 14.4  HCT 43.2  MCV 84.7  PLT 292   Lipid Panel: Recent Labs    10/09/17 0814  CHOL 222*  HDL 47  TRIG 227*  CHOLHDL 4.7   TSH: Recent Labs    10/09/17 0814  TSH 2.29   A1C: Lab Results  Component Value Date   HGBA1C 5.8 (H) 10/09/2017     Assessment/Plan 1. Infected sebaceous cyst of skin -to stop keflex (doxycycline was too expensive) -to change dressing twice daily -to use warm compress twice daily -to cleanse with soap and water daily  - sulfamethoxazole-trimethoprim (BACTRIM DS) 800-160 MG tablet; Take 1 tablet by mouth 2 (two) times daily.  Dispense: 20 tablet; Refill: 0  Next appt: 7-10 days for follow up [place]. Harle Battiest  [facility] [phone] 8 am - 5 pm) [phone] (after hours)

## 2017-11-07 ENCOUNTER — Other Ambulatory Visit: Payer: Self-pay | Admitting: *Deleted

## 2017-11-07 DIAGNOSIS — R69 Illness, unspecified: Secondary | ICD-10-CM | POA: Diagnosis not present

## 2017-11-07 MED ORDER — GLUCOSE BLOOD VI STRP: ORAL_STRIP | 1 refills | Status: DC

## 2017-11-07 NOTE — Telephone Encounter (Signed)
[facility] 

## 2017-11-08 ENCOUNTER — Ambulatory Visit: Payer: Medicare HMO | Admitting: Internal Medicine

## 2017-11-13 ENCOUNTER — Ambulatory Visit (INDEPENDENT_AMBULATORY_CARE_PROVIDER_SITE_OTHER): Payer: Medicare HMO | Admitting: Nurse Practitioner

## 2017-11-13 ENCOUNTER — Encounter: Payer: Self-pay | Admitting: Nurse Practitioner

## 2017-11-13 VITALS — BP 122/78 | HR 72 | Temp 98.2°F | Ht 66.0 in | Wt 205.0 lb

## 2017-11-13 DIAGNOSIS — L723 Sebaceous cyst: Secondary | ICD-10-CM | POA: Diagnosis not present

## 2017-11-13 DIAGNOSIS — L089 Local infection of the skin and subcutaneous tissue, unspecified: Secondary | ICD-10-CM

## 2017-11-13 MED ORDER — SULFAMETHOXAZOLE-TRIMETHOPRIM 800-160 MG PO TABS: 1.0000 | ORAL_TABLET | Freq: Two times a day (BID) | ORAL | 0 refills | Status: DC

## 2017-11-13 NOTE — Patient Instructions (Signed)
Continue dressing changes with warm compress and proper cleaning with soap and water twice daily To take antibiotic for a total of 2 weeks- Rx sent for 4 more days To follow up if you continue to have drainage after antibiotic complete

## 2017-11-13 NOTE — Progress Notes (Signed)
Careteam: Patient Care Team: Gayland Curry, DO as PCP - General (Geriatric Medicine) [name], MD as Consulting Physician (Urology) [name], MD as Consulting Physician (Ophthalmology) [name], MD as Consulting Physician (Ophthalmology)  Advanced Directive information    No Known Allergies  Chief Complaint  Patient presents with  . Follow-up    Pt is being seen for a 1 week follow up for cyst on his back. pt reports that cyst is still bleeding and draining.      HPI: Patient is a 77 y.o. male seen in the office today to follow up on infected cyst  Overall doing better, redness and tenderness. Over the past couple of days had increase in bloody purulent drainage. This morning the bleeding had stopped.  No fevers or chills. Overall feels well. No adverse effects from antibiotics    Review of Systems:  Review of Systems  Constitutional: Negative for chills, fever and malaise/fatigue.  Musculoskeletal: Negative for falls.  Skin:       Ongoing drainage to incision, tenderness and redness improved  Neurological: Negative for weakness.    Past Medical History:  Diagnosis Date  . Back pain, lumbosacral 2014  . Cancer of kidney ([place]) 2012  . Cataract 2013  . Diabetes mellitus without complication ([place]) 6568  . Hypertension 2012  . Nephrolithiasis 2012  . Obesity, unspecified   . Other and unspecified hyperlipidemia 04/03/2013  . Pain, joint, ankle and foot   . Unspecified hypothyroidism 2012   Past Surgical History:  Procedure Laterality Date  . CATARACT EXTRACTION W/ INTRAOCULAR LENS IMPLANT Bilateral 03/05/2016   Dr. [name]  . KIDNEY SURGERY  12/2010   had cancer removed; Dr. Alinda Money  . ROTATOR CUFF REPAIR     bilateral; Dr. [name]   Social History:   reports that  has never smoked. he has never used smokeless tobacco. He reports that he does not drink alcohol or use drugs.  History reviewed. No pertinent family  history.  Medications: Patient's Medications  New Prescriptions   No medications on file  Previous Medications   ASPIRIN 81 MG TABLET    One daily to revent heart attack or stroke   FINASTERIDE (PROSCAR) 5 MG TABLET    Take one tablet daily for prostate   GLUCOSE BLOOD (ONE TOUCH ULTRA TEST) TEST STRIP    Use to test blood sugar once daily. Dx:E11.9   LEVOTHYROXINE (SYNTHROID, LEVOTHROID) 50 MCG TABLET    TAKE 1 TABLET(50 MCG) BY MOUTH DAILY BEFORE BREAKFAST   METFORMIN (GLUCOPHAGE) 500 MG TABLET    TAKE 1 TABLET BY MOUTH TWICE DAILY TO CONTROL DIABETES   ONETOUCH DELICA LANCETS FINE MISC    USE TO CHECK BLOOD SUGAR ONCE DAILY   SULFAMETHOXAZOLE-TRIMETHOPRIM (BACTRIM DS) 800-160 MG TABLET    Take 1 tablet by mouth 2 (two) times daily.  Modified Medications   No medications on file  Discontinued Medications   No medications on file     Physical Exam:  Vitals:   11/13/17 1025  BP: 122/78  Pulse: 72  Temp: 98.2 F (36.8 C)  TempSrc: Oral  SpO2: 99%  Weight: 205 lb (93 kg)  Height: 5\' 6"  (1.676 m)   Body mass index is 33.09 kg/m.  Physical Exam  Constitutional: He is oriented to person, place, and time. He appears well-developed and well-nourished. No distress.  Neurological: He is alert and oriented to person, place, and time.  Skin:  Redness has resolved around incision from cyst removal to  middle of upper back; pieces of purulent drainage continue to be expressed    Labs reviewed: Basic Metabolic Panel: Recent Labs    10/09/17 0814  NA 137  K 4.7  CL 102  CO2 27  GLUCOSE 98  BUN 15  CREATININE 1.10  CALCIUM 9.5  TSH 2.29   Liver Function Tests: Recent Labs    10/09/17 0814  AST 21  ALT 17  BILITOT 0.5  PROT 6.3   No results for input(s): LIPASE, AMYLASE in the last 8760 hours. No results for input(s): AMMONIA in the last 8760 hours. CBC: Recent Labs    10/09/17 0814  WBC 8.5  NEUTROABS 5,126  HGB 14.4  HCT 43.2  MCV 84.7  PLT 292   Lipid  Panel: Recent Labs    10/09/17 0814  CHOL 222*  HDL 47  TRIG 227*  CHOLHDL 4.7   TSH: Recent Labs    10/09/17 0814  TSH 2.29   A1C: Lab Results  Component Value Date   HGBA1C 5.8 (H) 10/09/2017     Assessment/Plan 1. Infected sebaceous cyst of skin -improved but not resolved. Will extend bactrim for a total of 14 days. To continue home care with proper cleansing, warm compress and dressing changes.  - sulfamethoxazole-trimethoprim (BACTRIM DS) 800-160 MG tablet; Take 1 tablet by mouth 2 (two) times daily.  Dispense: 8 tablet; Refill: 0  Follow up precautions given Fabiana Dromgoole K. [place], [facility] [phone]

## 2017-11-16 ENCOUNTER — Telehealth: Payer: Self-pay | Admitting: Nurse Practitioner

## 2017-11-16 NOTE — Telephone Encounter (Signed)
Spoke to pt; to cont warm compresses if area still draining and to gently express any drainage

## 2017-11-16 NOTE — Telephone Encounter (Signed)
Patient was in office to get lab work and has questions about cyst; if he should continue to press on it and it is still draining  Please advice

## 2017-11-20 ENCOUNTER — Encounter: Payer: Self-pay | Admitting: Nurse Practitioner

## 2017-11-20 ENCOUNTER — Ambulatory Visit (INDEPENDENT_AMBULATORY_CARE_PROVIDER_SITE_OTHER): Payer: Medicare HMO | Admitting: Nurse Practitioner

## 2017-11-20 VITALS — BP 118/74 | HR 70 | Temp 98.6°F | Ht 66.0 in | Wt 204.0 lb

## 2017-11-20 DIAGNOSIS — L723 Sebaceous cyst: Secondary | ICD-10-CM

## 2017-11-20 DIAGNOSIS — L089 Local infection of the skin and subcutaneous tissue, unspecified: Secondary | ICD-10-CM | POA: Diagnosis not present

## 2017-11-20 DIAGNOSIS — K219 Gastro-esophageal reflux disease without esophagitis: Secondary | ICD-10-CM | POA: Diagnosis not present

## 2017-11-20 MED ORDER — OMEPRAZOLE 20 MG PO CPDR: 20.0000 mg | DELAYED_RELEASE_CAPSULE | Freq: Every day | ORAL | 3 refills | Status: DC

## 2017-11-20 NOTE — Patient Instructions (Signed)
Referral placed for surgery at this time.  You can cont warm compression but would not forcefully express

## 2017-11-20 NOTE — Progress Notes (Signed)
Careteam: Patient Care Team: Gayland Curry, DO as PCP - General (Geriatric Medicine) [name], MD as Consulting Physician (Urology) [name], MD as Consulting Physician (Ophthalmology) [name], MD as Consulting Physician (Ophthalmology)  Advanced Directive information    No Known Allergies  Chief Complaint  Patient presents with  . Acute Visit    Pt is being seen due to 2 new openings on either side of cyst on upper back. Pt states that left spot has been bleeding and both new spots are painful.      HPI: Patient is a 77 y.o. male seen in the office today ongoing drainage from cyst. Pt reports he is still having drainage. Not painful but aware something is there. Reports wife still using compress. Has been using firm pressure area area and causing some secondary irritation   Review of Systems:  Review of Systems  Constitutional: Negative for chills, fever and malaise/fatigue.  Gastrointestinal: Positive for heartburn.  Musculoskeletal: Negative for falls.  Skin:       Ongoing drainage to incision, tenderness and redness improved  Neurological: Negative for weakness.    Past Medical History:  Diagnosis Date  . Back pain, lumbosacral 2014  . Cancer of kidney ([place]) 2012  . Cataract 2013  . Diabetes mellitus without complication ([place]) 5277  . Hypertension 2012  . Nephrolithiasis 2012  . Obesity, unspecified   . Other and unspecified hyperlipidemia 04/03/2013  . Pain, joint, ankle and foot   . Unspecified hypothyroidism 2012   Past Surgical History:  Procedure Laterality Date  . CATARACT EXTRACTION W/ INTRAOCULAR LENS IMPLANT Bilateral 03/05/2016   Dr. [name]  . KIDNEY SURGERY  12/2010   had cancer removed; Dr. Alinda Money  . ROTATOR CUFF REPAIR     bilateral; Dr. [name]   Social History:   reports that  has never smoked. he has never used smokeless tobacco. He reports that he does not drink alcohol or use drugs.  History reviewed. No pertinent  family history.  Medications: Patient's Medications  New Prescriptions   No medications on file  Previous Medications   ASPIRIN 81 MG TABLET    One daily to revent heart attack or stroke   FINASTERIDE (PROSCAR) 5 MG TABLET    Take one tablet daily for prostate   GLUCOSE BLOOD (ONE TOUCH ULTRA TEST) TEST STRIP    Use to test blood sugar once daily. Dx:E11.9   LEVOTHYROXINE (SYNTHROID, LEVOTHROID) 50 MCG TABLET    TAKE 1 TABLET(50 MCG) BY MOUTH DAILY BEFORE BREAKFAST   METFORMIN (GLUCOPHAGE) 500 MG TABLET    TAKE 1 TABLET BY MOUTH TWICE DAILY TO CONTROL DIABETES   ONETOUCH DELICA LANCETS FINE MISC    USE TO CHECK BLOOD SUGAR ONCE DAILY  Modified Medications   No medications on file  Discontinued Medications   SULFAMETHOXAZOLE-TRIMETHOPRIM (BACTRIM DS) 800-160 MG TABLET    Take 1 tablet by mouth 2 (two) times daily.     Physical Exam:  Vitals:   11/20/17 1532  BP: 118/74  Pulse: 70  Temp: 98.6 F (37 C)  TempSrc: Oral  SpO2: 98%  Weight: 204 lb (92.5 kg)  Height: 5\' 6"  (1.676 m)   Body mass index is 32.93 kg/m.  Physical Exam  Constitutional: He is oriented to person, place, and time. He appears well-developed and well-nourished. No distress.  Cardiovascular: Normal rate and regular rhythm.  Pulmonary/Chest: Effort normal and breath sounds normal.  Neurological: He is alert and oriented to person, place, and time.  Skin:  Redness has resolved around incision which has closed except for small area from cyst removal to middle of upper back; purulent bloody drainage continue to be expressed    Labs reviewed: Basic Metabolic Panel: Recent Labs    10/09/17 0814  NA 137  K 4.7  CL 102  CO2 27  GLUCOSE 98  BUN 15  CREATININE 1.10  CALCIUM 9.5  TSH 2.29   Liver Function Tests: Recent Labs    10/09/17 0814  AST 21  ALT 17  BILITOT 0.5  PROT 6.3   No results for input(s): LIPASE, AMYLASE in the last 8760 hours. No results for input(s): AMMONIA in the last 8760  hours. CBC: Recent Labs    10/09/17 0814  WBC 8.5  NEUTROABS 5,126  HGB 14.4  HCT 43.2  MCV 84.7  PLT 292   Lipid Panel: Recent Labs    10/09/17 0814  CHOL 222*  HDL 47  LDLCALC 138*  TRIG 227*  CHOLHDL 4.7   TSH: Recent Labs    10/09/17 0814  TSH 2.29   A1C: Lab Results  Component Value Date   HGBA1C 5.8 (H) 10/09/2017     Assessment/Plan 1. Infected sebaceous cyst of skin -no longer with acute infection but nonhealing and continues with purulent drainage; surrounding area without erythema - Ambulatory referral to General Surgery for removal   2. Gastroesophageal reflux disease without esophagitis -lifestyle modifications encouraged, will add daily omeprazole.  - omeprazole (PRILOSEC) 20 MG capsule; Take 1 capsule (20 mg total) by mouth daily.  Dispense: 30 capsule; Refill: 3  Bentli Llorente K. Jeffers, [facility] [phone]

## 2017-11-24 DIAGNOSIS — L089 Local infection of the skin and subcutaneous tissue, unspecified: Secondary | ICD-10-CM | POA: Diagnosis not present

## 2017-11-24 DIAGNOSIS — L723 Sebaceous cyst: Secondary | ICD-10-CM | POA: Diagnosis not present

## 2017-11-27 DIAGNOSIS — Z6834 Body mass index (BMI) 34.0-34.9, adult: Secondary | ICD-10-CM | POA: Diagnosis not present

## 2017-11-27 DIAGNOSIS — E039 Hypothyroidism, unspecified: Secondary | ICD-10-CM | POA: Diagnosis not present

## 2017-11-27 DIAGNOSIS — R030 Elevated blood-pressure reading, without diagnosis of hypertension: Secondary | ICD-10-CM | POA: Diagnosis not present

## 2017-11-27 DIAGNOSIS — Z7982 Long term (current) use of aspirin: Secondary | ICD-10-CM | POA: Diagnosis not present

## 2017-11-27 DIAGNOSIS — E669 Obesity, unspecified: Secondary | ICD-10-CM | POA: Diagnosis not present

## 2017-11-27 DIAGNOSIS — E119 Type 2 diabetes mellitus without complications: Secondary | ICD-10-CM | POA: Diagnosis not present

## 2017-11-27 DIAGNOSIS — Z7984 Long term (current) use of oral hypoglycemic drugs: Secondary | ICD-10-CM | POA: Diagnosis not present

## 2017-11-27 DIAGNOSIS — N529 Male erectile dysfunction, unspecified: Secondary | ICD-10-CM | POA: Diagnosis not present

## 2017-11-27 DIAGNOSIS — Z8249 Family history of ischemic heart disease and other diseases of the circulatory system: Secondary | ICD-10-CM | POA: Diagnosis not present

## 2017-11-27 DIAGNOSIS — N400 Enlarged prostate without lower urinary tract symptoms: Secondary | ICD-10-CM | POA: Diagnosis not present

## 2017-12-17 ENCOUNTER — Other Ambulatory Visit: Payer: Self-pay | Admitting: Internal Medicine

## 2018-01-04 DIAGNOSIS — L089 Local infection of the skin and subcutaneous tissue, unspecified: Secondary | ICD-10-CM | POA: Diagnosis not present

## 2018-01-04 DIAGNOSIS — L723 Sebaceous cyst: Secondary | ICD-10-CM | POA: Diagnosis not present

## 2018-01-15 DIAGNOSIS — L723 Sebaceous cyst: Secondary | ICD-10-CM | POA: Diagnosis not present

## 2018-01-15 DIAGNOSIS — L089 Local infection of the skin and subcutaneous tissue, unspecified: Secondary | ICD-10-CM | POA: Diagnosis not present

## 2018-01-22 DIAGNOSIS — Z961 Presence of intraocular lens: Secondary | ICD-10-CM | POA: Diagnosis not present

## 2018-01-22 DIAGNOSIS — H348322 Tributary (branch) retinal vein occlusion, left eye, stable: Secondary | ICD-10-CM | POA: Diagnosis not present

## 2018-01-22 DIAGNOSIS — H26493 Other secondary cataract, bilateral: Secondary | ICD-10-CM | POA: Diagnosis not present

## 2018-01-22 LAB — HM DIABETES EYE EXAM

## 2018-02-05 ENCOUNTER — Other Ambulatory Visit: Payer: Medicare HMO

## 2018-02-05 DIAGNOSIS — E785 Hyperlipidemia, unspecified: Secondary | ICD-10-CM | POA: Diagnosis not present

## 2018-02-05 DIAGNOSIS — E1169 Type 2 diabetes mellitus with other specified complication: Secondary | ICD-10-CM | POA: Diagnosis not present

## 2018-02-05 DIAGNOSIS — E6609 Other obesity due to excess calories: Secondary | ICD-10-CM

## 2018-02-05 DIAGNOSIS — E119 Type 2 diabetes mellitus without complications: Secondary | ICD-10-CM

## 2018-02-05 DIAGNOSIS — Z6833 Body mass index (BMI) 33.0-33.9, adult: Secondary | ICD-10-CM | POA: Diagnosis not present

## 2018-02-06 LAB — COMPLETE METABOLIC PANEL WITH GFR
AG Ratio: 1.8 (calc) (ref 1.0–2.5)
ALT: 11 U/L (ref 9–46)
AST: 14 U/L (ref 10–35)
Albumin: 4.1 g/dL (ref 3.6–5.1)
Alkaline phosphatase (APISO): 83 U/L (ref 40–115)
BUN: 11 mg/dL (ref 7–25)
CO2: 26 mmol/L (ref 20–32)
Calcium: 9.5 mg/dL (ref 8.6–10.3)
Chloride: 105 mmol/L (ref 98–110)
Creat: 1 mg/dL (ref 0.70–1.18)
GFR, Est African American: 85 mL/min/{1.73_m2} (ref >=60)
GFR, Est Non African American: 73 mL/min/{1.73_m2} (ref >=60)
Globulin: 2.3 g/dL (calc) (ref 1.9–3.7)
Glucose, Bld: 100 mg/dL — ABNORMAL HIGH (ref 65–99)
Potassium: 4.7 mmol/L (ref 3.5–5.3)
Sodium: 140 mmol/L (ref 135–146)
Total Bilirubin: 0.5 mg/dL (ref 0.2–1.2)
Total Protein: 6.4 g/dL (ref 6.1–8.1)

## 2018-02-06 LAB — CBC WITH DIFFERENTIAL/PLATELET
Basophils Absolute: 100 cells/uL (ref 0–200)
Basophils Relative: 1.3 %
Eosinophils Absolute: 416 cells/uL (ref 15–500)
Eosinophils Relative: 5.4 %
HCT: 42.9 % (ref 38.5–50.0)
Hemoglobin: 14.5 g/dL (ref 13.2–17.1)
Lymphs Abs: 1956 cells/uL (ref 850–3900)
MCH: 28.9 pg (ref 27.0–33.0)
MCHC: 33.8 g/dL (ref 32.0–36.0)
MCV: 85.5 fL (ref 80.0–100.0)
MPV: 10.5 fL (ref 7.5–12.5)
Monocytes Relative: 6.9 %
Neutro Abs: 4697 cells/uL (ref 1500–7800)
Neutrophils Relative %: 61 %
Platelets: 289 10*3/uL (ref 140–400)
RBC: 5.02 10*6/uL (ref 4.20–5.80)
RDW: 13.5 % (ref 11.0–15.0)
Total Lymphocyte: 25.4 %
WBC mixed population: 531 cells/uL (ref 200–950)
WBC: 7.7 10*3/uL (ref 3.8–10.8)

## 2018-02-06 LAB — LIPID PANEL
Cholesterol: 207 mg/dL — ABNORMAL HIGH (ref <200)
HDL: 44 mg/dL (ref >40)
LDL Cholesterol (Calc): 131 mg/dL (calc) — ABNORMAL HIGH
Non-HDL Cholesterol (Calc): 163 mg/dL (calc) — ABNORMAL HIGH (ref <130)
Total CHOL/HDL Ratio: 4.7 (calc) (ref <5.0)
Triglycerides: 181 mg/dL — ABNORMAL HIGH (ref <150)

## 2018-02-06 LAB — HEMOGLOBIN A1C
Hgb A1c MFr Bld: 5.6 % of total Hgb (ref <5.7)
Mean Plasma Glucose: 114 (calc)
eAG (mmol/L): 6.3 (calc)

## 2018-02-10 ENCOUNTER — Other Ambulatory Visit: Payer: Self-pay | Admitting: Nurse Practitioner

## 2018-02-10 DIAGNOSIS — K219 Gastro-esophageal reflux disease without esophagitis: Secondary | ICD-10-CM

## 2018-02-12 ENCOUNTER — Ambulatory Visit (INDEPENDENT_AMBULATORY_CARE_PROVIDER_SITE_OTHER): Payer: Medicare HMO | Admitting: Internal Medicine

## 2018-02-12 ENCOUNTER — Encounter: Payer: Self-pay | Admitting: Internal Medicine

## 2018-02-12 ENCOUNTER — Ambulatory Visit (INDEPENDENT_AMBULATORY_CARE_PROVIDER_SITE_OTHER): Payer: Medicare HMO

## 2018-02-12 VITALS — BP 120/70 | HR 71 | Temp 98.2°F | Ht 65.0 in | Wt 204.0 lb

## 2018-02-12 VITALS — BP 140/66 | HR 71 | Temp 98.2°F | Ht 66.0 in | Wt 204.0 lb

## 2018-02-12 DIAGNOSIS — E6609 Other obesity due to excess calories: Secondary | ICD-10-CM | POA: Diagnosis not present

## 2018-02-12 DIAGNOSIS — K219 Gastro-esophageal reflux disease without esophagitis: Secondary | ICD-10-CM | POA: Diagnosis not present

## 2018-02-12 DIAGNOSIS — I10 Essential (primary) hypertension: Secondary | ICD-10-CM

## 2018-02-12 DIAGNOSIS — E119 Type 2 diabetes mellitus without complications: Secondary | ICD-10-CM

## 2018-02-12 DIAGNOSIS — E039 Hypothyroidism, unspecified: Secondary | ICD-10-CM | POA: Diagnosis not present

## 2018-02-12 DIAGNOSIS — Z0000 Encounter for general adult medical examination without abnormal findings: Secondary | ICD-10-CM

## 2018-02-12 DIAGNOSIS — C642 Malignant neoplasm of left kidney, except renal pelvis: Secondary | ICD-10-CM | POA: Diagnosis not present

## 2018-02-12 DIAGNOSIS — Z6833 Body mass index (BMI) 33.0-33.9, adult: Secondary | ICD-10-CM | POA: Diagnosis not present

## 2018-02-12 NOTE — Patient Instructions (Addendum)
Try to reach 190 lbs by our next visit in 4 months.  Fat and Cholesterol Restricted Diet High levels of fat and cholesterol in your blood may lead to various health problems, such as diseases of the heart, blood vessels, gallbladder, liver, and pancreas. Fats are concentrated sources of energy that come in various forms. Certain types of fat, including saturated fat, may be harmful in excess. Cholesterol is a substance needed by your body in small amounts. Your body makes all the cholesterol it needs. Excess cholesterol comes from the food you eat. When you have high levels of cholesterol and saturated fat in your blood, health problems can develop because the excess fat and cholesterol will gather along the walls of your blood vessels, causing them to narrow. Choosing the right foods will help you control your intake of fat and cholesterol. This will help keep the levels of these substances in your blood within normal limits and reduce your risk of disease. What is my plan? Your health care provider recommends that you:  Limit your fat intake   Limit the amount of cholesterol in your diet   Eat 20-30 grams of fiber each day.  What types of fat should I choose?  Choose healthy fats more often. Choose monounsaturated and polyunsaturated fats, such as olive and canola oil, flaxseeds, walnuts, almonds, and seeds.  Eat more omega-3 fats. Good choices include salmon, mackerel, sardines, tuna, flaxseed oil, and ground flaxseeds. Aim to eat fish at least two times a week.  Limit saturated fats. Saturated fats are primarily found in animal products, such as meats, butter, and cream. Plant sources of saturated fats include palm oil, palm kernel oil, and coconut oil.  Avoid foods with partially hydrogenated oils in them. These contain trans fats. Examples of foods that contain trans fats are stick margarine, some tub margarines, cookies, crackers, and other baked goods. What general guidelines do I need  to follow? These guidelines for healthy eating will help you control your intake of fat and cholesterol:  Check food labels carefully to identify foods with trans fats or high amounts of saturated fat.  Fill one half of your plate with vegetables and green salads.  Fill one fourth of your plate with whole grains. Look for the word "whole" as the first word in the ingredient list.  Fill one fourth of your plate with lean protein foods.  Limit fruit to two servings a day. Choose fruit instead of juice.  Eat more foods that contain fiber, such as apples, broccoli, carrots, beans, peas, and barley.  Eat more home-cooked food and less restaurant, buffet, and fast food.  Limit or avoid alcohol.  Limit foods high in starch and sugar.  Limit fried foods.  Cook foods using methods other than frying. Baking, boiling, grilling, and broiling are all great options.  Lose weight if you are overweight. Losing just 5-10% of your initial body weight can help your overall health and prevent diseases such as diabetes and heart disease.  What foods can I eat? Grains  Whole grains, such as whole wheat or whole grain breads, crackers, cereals, and pasta. Unsweetened oatmeal, bulgur, barley, quinoa, or brown rice. Corn or whole wheat flour tortillas. Vegetables  Fresh or frozen vegetables (raw, steamed, roasted, or grilled). Green salads. Fruits  All fresh, canned (in natural juice), or frozen fruits. Meats and other protein foods  Ground beef (85% or leaner), grass-fed beef, or beef trimmed of fat. Skinless chicken or [place]. Ground chicken or [place]. Pork  trimmed of fat. All fish and seafood. Eggs. Dried beans, peas, or lentils. Unsalted nuts or seeds. Unsalted canned or dry beans. Dairy  Low-fat dairy products, such as skim or 1% milk, 2% or reduced-fat cheeses, low-fat ricotta or cottage cheese, or plain low-fat yo Fats and oils  Tub margarines without trans fats. Light or reduced-fat  mayonnaise and salad dressings. Avocado. Olive, canola, sesame, or safflower oils. Natural peanut or almond butter (choose ones without added sugar and oil). The items listed above may not be a complete list of recommended foods or beverages. Contact your dietitian for more options. Foods to avoid Grains  White bread. White pasta. White rice. Cornbread. Bagels, pastries, and croissants. Crackers that contain trans fat. Vegetables  White potatoes. Corn. Creamed or fried vegetables. Vegetables in a cheese sauce. Fruits  Dried fruits. Canned fruit in light or heavy syrup. Fruit juice. Meats and other protein foods  Fatty cuts of meat. Ribs, chicken wings, bacon, sausage, bologna, salami, chitterlings, fatback, hot dogs, bratwurst, and packaged luncheon meats. Liver and organ meats. Dairy  Whole or 2% milk, cream, half-and-half, and cream cheese. Whole milk cheeses. Whole-fat or sweetened yogurt. Full-fat cheeses. Nondairy creamers and whipped toppings. Processed cheese, cheese spreads, or cheese curds. Beverages  Alcohol. Sweetened drinks (such as sodas, lemonade, and fruit drinks or punches). Fats and oils  Butter, stick margarine, lard, shortening, ghee, or bacon fat. Coconut, palm kernel, or palm oils. Sweets and desserts  Corn syrup, sugars, honey, and molasses. Candy. Jam and jelly. Syrup. Sweetened cereals. Cookies, pies, cakes, donuts, muffins, and ice cream. The items listed above may not be a complete list of foods and beverages to avoid. Contact your dietitian for more information. This information is not intended to replace advice given to you by your health care provider. Make sure you discuss any questions you have with your health care provider. Document Released: 08/22/2005 Document Revised: 09/12/2014 Document Reviewed: 11/20/2013 Elsevier Interactive Patient Education  [employer].

## 2018-02-12 NOTE — Patient Instructions (Signed)
Nicholas Rivera , Thank you for taking time to come for your Medicare Wellness Visit. I appreciate your ongoing commitment to your health goals. Please review the following plan we discussed and let me know if I can assist you in the future.   Screening recommendations/referrals: Colonoscopy up to date Recommended yearly ophthalmology/optometry visit for glaucoma screening and checkup Recommended yearly dental visit for hygiene and checkup  Vaccinations: Influenza vaccine up to date, due 2019 fall season Pneumococcal vaccine 23 due, requested to be given at next office visit Tdap vaccine up to date, due 10/17/2027 Shingles vaccine due, declined    Advanced directives: In chart  Conditions/risks identified: none  Next appointment: Tyson Dense, RN 02/15/2019 @ 9:15am  Preventive Care 65 Years and Older, Male Preventive care refers to lifestyle choices and visits with your health care provider that can promote health and wellness. What does preventive care include?  A yearly physical exam. This is also called an annual well check.  Dental exams once or twice a year.  Routine eye exams. Ask your health care provider how often you should have your eyes checked.  Personal lifestyle choices, including:  Daily care of your teeth and gums.  Regular physical activity.  Eating a healthy diet.  Avoiding tobacco and drug use.  Limiting alcohol use.  Practicing safe sex.  Taking low doses of aspirin every day.  Taking vitamin and mineral supplements as recommended by your health care provider. What happens during an annual well check? The services and screenings done by your health care provider during your annual well check will depend on your age, overall health, lifestyle risk factors, and family history of disease. Counseling  Your health care provider may ask you questions about your:  Alcohol use.  Tobacco use.  Drug use.  Emotional well-being.  Home and relationship  well-being.  Sexual activity.  Eating habits.  History of falls.  Memory and ability to understand (cognition).  Work and work Statistician. Screening  You may have the following tests or measurements:  Height, weight, and BMI.  Blood pressure.  Lipid and cholesterol levels. These may be checked every 5 years, or more frequently if you are over 51 years old.  Skin check.  Lung cancer screening. You may have this screening every year starting at age 36 if you have a 30-pack-year history of smoking and currently smoke or have quit within the past 15 years.  Fecal occult blood test (FOBT) of the stool. You may have this test every year starting at age 47.  Flexible sigmoidoscopy or colonoscopy. You may have a sigmoidoscopy every 5 years or a colonoscopy every 10 years starting at age 53.  Prostate cancer screening. Recommendations will vary depending on your family history and other risks.  Hepatitis C blood test.  Hepatitis B blood test.  Sexually transmitted disease (STD) testing.  Diabetes screening. This is done by checking your blood sugar (glucose) after you have not eaten for a while (fasting). You may have this done every 1-3 years.  Abdominal aortic aneurysm (AAA) screening. You may need this if you are a current or former smoker.  Osteoporosis. You may be screened starting at age 82 if you are at high risk. Talk with your health care provider about your test results, treatment options, and if necessary, the need for more tests. Vaccines  Your health care provider may recommend certain vaccines, such as:  Influenza vaccine. This is recommended every year.  Tetanus, diphtheria, and acellular pertussis (Tdap, Td)  vaccine. You may need a Td booster every 10 years.  Zoster vaccine. You may need this after age 39.  Pneumococcal 13-valent conjugate (PCV13) vaccine. One dose is recommended after age 24.  Pneumococcal polysaccharide (PPSV23) vaccine. One dose is  recommended after age 72. Talk to your health care provider about which screenings and vaccines you need and how often you need them. This information is not intended to replace advice given to you by your health care provider. Make sure you discuss any questions you have with your health care provider. Document Released: 09/18/2015 Document Revised: 05/11/2016 Document Reviewed: 06/23/2015 Elsevier Interactive Patient Education  2017 [place] Prevention in the Home Falls can cause injuries. They can happen to people of all ages. There are many things you can do to make your home safe and to help prevent falls. What can I do on the outside of my home?  Regularly fix the edges of walkways and driveways and fix any cracks.  Remove anything that might make you trip as you walk through a door, such as a raised step or threshold.  Trim any bushes or trees on the path to your home.  Use bright outdoor lighting.  Clear any walking paths of anything that might make someone trip, such as rocks or tools.  Regularly check to see if handrails are loose or broken. Make sure that both sides of any steps have handrails.  Any raised decks and porches should have guardrails on the edges.  Have any leaves, snow, or ice cleared regularly.  Use sand or salt on walking paths during winter.  Clean up any spills in your garage right away. This includes oil or grease spills. What can I do in the bathroom?  Use night lights.  Install grab bars by the toilet and in the tub and shower. Do not use towel bars as grab bars.  Use non-skid mats or decals in the tub or shower.  If you need to sit down in the shower, use a plastic, non-slip stool.  Keep the floor dry. Clean up any water that spills on the floor as soon as it happens.  Remove soap buildup in the tub or shower regularly.  Attach bath mats securely with double-sided non-slip rug tape.  Do not have throw rugs and other things on  the floor that can make you trip. What can I do in the bedroom?  Use night lights.  Make sure that you have a light by your bed that is easy to reach.  Do not use any sheets or blankets that are too big for your bed. They should not hang down onto the floor.  Have a firm chair that has side arms. You can use this for support while you get dressed.  Do not have throw rugs and other things on the floor that can make you trip. What can I do in the kitchen?  Clean up any spills right away.  Avoid walking on wet floors.  Keep items that you use a lot in easy-to-reach places.  If you need to reach something above you, use a strong step stool that has a grab bar.  Keep electrical cords out of the way.  Do not use floor polish or wax that makes floors slippery. If you must use wax, use non-skid floor wax.  Do not have throw rugs and other things on the floor that can make you trip. What can I do with my stairs?  Do not leave  any items on the stairs.  Make sure that there are handrails on both sides of the stairs and use them. Fix handrails that are broken or loose. Make sure that handrails are as long as the stairways.  Check any carpeting to make sure that it is firmly attached to the stairs. Fix any carpet that is loose or worn.  Avoid having throw rugs at the top or bottom of the stairs. If you do have throw rugs, attach them to the floor with carpet tape.  Make sure that you have a light switch at the top of the stairs and the bottom of the stairs. If you do not have them, ask someone to add them for you. What else can I do to help prevent falls?  Wear shoes that:  Do not have high heels.  Have rubber bottoms.  Are comfortable and fit you well.  Are closed at the toe. Do not wear sandals.  If you use a stepladder:  Make sure that it is fully opened. Do not climb a closed stepladder.  Make sure that both sides of the stepladder are locked into place.  Ask someone to  hold it for you, if possible.  Clearly mark and make sure that you can see:  Any grab bars or handrails.  First and last steps.  Where the edge of each step is.  Use tools that help you move around (mobility aids) if they are needed. These include:  Canes.  Walkers.  Scooters.  Crutches.  Turn on the lights when you go into a dark area. Replace any light bulbs as soon as they burn out.  Set up your furniture so you have a clear path. Avoid moving your furniture around.  If any of your floors are uneven, fix them.  If there are any pets around you, be aware of where they are.  Review your medicines with your doctor. Some medicines can make you feel dizzy. This can increase your chance of falling. Ask your doctor what other things that you can do to help prevent falls. This information is not intended to replace advice given to you by your health care provider. Make sure you discuss any questions you have with your health care provider. Document Released: 06/18/2009 Document Revised: 01/28/2016 Document Reviewed: 09/26/2014 Elsevier Interactive Patient Education  2017 [employer].

## 2018-02-12 NOTE — Progress Notes (Signed)
Subjective:   Nicholas Rivera is a 77 y.o. male who presents for Medicare Annual/Subsequent preventive examination.  Last AWV-01/06/2017    Objective:    Vitals: BP 140/66 (BP Location: Left Arm, Patient Position: Sitting)   Pulse 71   Temp 98.2 F (36.8 C) (Oral)   Ht 5\' 6"  (1.676 m)   Wt 204 lb (92.5 kg)   SpO2 96%   BMI 32.93 kg/m   Body mass index is 32.93 kg/m.  Advanced Directives 02/12/2018 10/16/2017 06/12/2017 01/06/2017 09/29/2016 08/30/2016 04/05/2016  Does Patient Have a Medical Advance Directive? Yes Yes Yes Yes Yes Yes Yes  Type of Paramedic of [place];Living will Healthcare Power of [place];Living will Healthcare Power of [place]  Does patient want to make changes to medical advance directive? No - Patient declined - - No - Patient declined - - -  Copy of [place] in Chart? Yes Yes Yes Yes Yes Yes Yes    Tobacco Social History   Tobacco Use  Smoking Status Never Smoker  Smokeless Tobacco Never Used     Counseling given: Not Answered   Clinical Intake:  Pre-visit preparation completed: No  Pain : No/denies pain     Nutritional Risks: None Diabetes: No  How often do you need to have someone help you when you read instructions, pamphlets, or other written materials from your doctor or pharmacy?: 1 - Never What is the last grade level you completed in school?: HS  Interpreter Needed?: No  Information entered by :: Tyson Dense, RN  Past Medical History:  Diagnosis Date  . Back pain, lumbosacral 2014  . Cancer of kidney ([place]) 2012  . Cataract 2013  . Diabetes mellitus without complication ([place]) 3295  . Hypertension 2012  . Nephrolithiasis 2012  . Obesity, unspecified   . Other and unspecified hyperlipidemia 04/03/2013  . Pain, joint, ankle and foot   . Unspecified hypothyroidism 2012    Past Surgical History:  Procedure Laterality Date  . CATARACT EXTRACTION W/ INTRAOCULAR LENS IMPLANT Bilateral 03/05/2016   Dr. [name]  . CYST REMOVAL TRUNK  11/03/2017  . KIDNEY SURGERY  12/2010   had cancer removed; Dr. Alinda Money  . ROTATOR CUFF REPAIR     bilateral; Dr. [name]   No family history on file. Social History   Socioeconomic History  . Marital status: Married    Spouse name: Not on file  . Number of children: Not on file  . Years of education: Not on file  . Highest education level: Not on file  Occupational History  . Occupation: Retired  Scientific laboratory technician  . Financial resource strain: Not hard at all  . Food insecurity:    Worry: Never true    Inability: Never true  . Transportation needs:    Medical: No    Non-medical: No  Tobacco Use  . Smoking status: Never Smoker  . Smokeless tobacco: Never Used  Substance and Sexual Activity  . Alcohol use: No  . Drug use: No  . Sexual activity: Yes  Lifestyle  . Physical activity:    Days per week: 7 days    Minutes per session: 30 min  . Stress: Only a little  Relationships  . Social connections:    Talks on phone: More than three times a week    Gets together: More than three times a week    Attends  religious service: More than 4 times per year    Active member of club or organization: No    Attends meetings of clubs or organizations: Never    Relationship status: Married  Other Topics Concern  . Not on file  Social History Narrative   Patient was previously married. His wife has died from cancer.   Remarried to Ronalee Belts   Never smoked   Alcohol none   Exercise 30 minutes daily   POA    Outpatient Encounter Medications as of 02/12/2018  Medication Sig  . aspirin 81 MG tablet One daily to revent heart attack or stroke  . finasteride (PROSCAR) 5 MG tablet Take one tablet daily for prostate  . glucose blood (ONE TOUCH ULTRA TEST) test strip Use to test blood sugar once daily. Dx:E11.9  . levothyroxine  (SYNTHROID, LEVOTHROID) 50 MCG tablet TAKE 1 TABLET BY MOUTH EVERY DAY BEFORE BREAKFAST  . metFORMIN (GLUCOPHAGE) 500 MG tablet TAKE 1 TABLET BY MOUTH TWICE DAILY TO CONTROL DIABETES  . omeprazole (PRILOSEC) 20 MG capsule TAKE 1 CAPSULE BY MOUTH EVERY DAY  . ONETOUCH DELICA LANCETS FINE MISC USE TO CHECK BLOOD SUGAR ONCE DAILY   No facility-administered encounter medications on file as of 02/12/2018.     Activities of Daily Living In your present state of health, do you have any difficulty performing the following activities: 02/12/2018  Hearing? N  Vision? N  Difficulty concentrating or making decisions? N  Walking or climbing stairs? N  Dressing or bathing? N  Doing errands, shopping? N  Preparing Food and eating ? N  Using the Toilet? N  In the past six months, have you accidently leaked urine? N  Do you have problems with loss of bowel control? N  Managing your Medications? N  Managing your Finances? N  Housekeeping or managing your Housekeeping? N  Some recent data might be hidden    Patient Care Team: Gayland Curry, DO as PCP - General (Geriatric Medicine) [name], MD as Consulting Physician (Urology) [name], MD as Consulting Physician (Ophthalmology) [name], MD as Consulting Physician (Ophthalmology)   Assessment:   This is a routine wellness examination for Nicholas Rivera.  Exercise Activities and Dietary recommendations Current Exercise Habits: Home exercise routine, Type of exercise: walking, Time (Minutes): 30, Frequency (Times/Week): 7, Weekly Exercise (Minutes/Week): 210, Intensity: Mild, Exercise limited by: None identified  Goals    . Weight (lb) < 175 lb (79.4 kg)     Starting 01/06/2017 I would like to continue working to my goal weight.       Fall Risk Fall Risk  02/12/2018 11/20/2017 11/13/2017 11/06/2017 10/16/2017  Falls in the past year? No No No No No  Number falls in past yr: - - - - -  Injury with Fall? - - - - -   Is the patient's  home free of loose throw rugs in walkways, pet beds, electrical cords, etc?   yes      Grab bars in the bathroom? yes      Handrails on the stairs?   yes      Adequate lighting?   yes  Depression Screen PHQ 2/9 Scores 02/12/2018 06/12/2017 01/06/2017 04/05/2016  PHQ - 2 Score 0 0 0 0    Cognitive Function MMSE - Mini Mental State Exam 02/12/2018 01/06/2017 04/05/2016 06/04/2014  Orientation to time 5 5 5 5   Orientation to Place 5 5 5 5   Registration 3 3 3 3   Attention/ Calculation 4 5  5 5  Recall 2 3 2 3   Language- name 2 objects 2 2 2 2   Language- repeat 1 1 1 1   Language- follow 3 step command 3 3 3 3   Language- read & follow direction 1 1 1 1   Write a sentence 1 1 1 1   Copy design 1 1 1 1   Total score 28 30 29 30         Immunization History  Administered Date(s) Administered  . Influenza,inj,Quad PF,6+ Mos 05/13/2015  . Influenza-Unspecified 06/05/2013, 06/20/2014, 05/30/2016, 05/06/2017  . Pneumococcal Conjugate-13 06/04/2014  . Pneumococcal Polysaccharide-23 09/05/1998  . Td 09/06/2003, 10/16/2017    Qualifies for Shingles Vaccine? Due, declined  Screening Tests Health Maintenance  Topic Date Due  . PNA vac Low Risk Adult (2 of 2 - PPSV23) 06/05/2015  . FOOT EXAM  04/05/2017  . URINE MICROALBUMIN  10/07/2017  . OPHTHALMOLOGY EXAM  01/16/2018  . INFLUENZA VACCINE  04/05/2018  . HEMOGLOBIN A1C  08/07/2018  . Fecal DNA (Cologuard)  04/28/2019  . TETANUS/TDAP  10/17/2027   Cancer Screenings: Lung: Low Dose CT Chest recommended if Age 55-80 years, 30 pack-year currently smoking OR have quit w/in 15years. Patient does not qualify. Colorectal: up to date  Additional Screenings:  Hepatitis C Screening:declined  PNA 23 due-declined. He wants to get it at his next office visit    Plan:    I have personally reviewed and addressed the Medicare Annual Wellness questionnaire and have noted the following in the patient's chart:  A. Medical and social history B. Use of alcohol,  tobacco or illicit drugs  C. Current medications and supplements D. Functional ability and status E.  Nutritional status F.  Physical activity G. Advance directives H. List of other physicians I.  Hospitalizations, surgeries, and ER visits in previous 12 months J.  [place] to include hearing, vision, cognitive, depression L. Referrals and appointments - none  In addition, I have reviewed and discussed with patient certain preventive protocols, quality metrics, and best practice recommendations. A written personalized care plan for preventive services as well as general preventive health recommendations were provided to patient.  See attached scanned questionnaire for additional information.   Signed,   Tyson Dense, RN Nurse Health Advisor  Patient Concerns: None

## 2018-02-12 NOTE — Progress Notes (Signed)
Location:  [facility] clinic Provider:  Armanie Ullmer L. Mariea Clonts, D.O., C.M.D.  Code Status: DNR Goals of Care:  Advanced Directives 02/12/2018  Does Patient Have a Medical Advance Directive? Yes  Type of Advance Directive [place]  Does patient want to make changes to medical advance directive? No - Patient declined  Copy of [employer] in Chart? Yes   Chief Complaint  Patient presents with  . Medical Management of Chronic Issues    71mth follow-up    HPI: Patient is a 77 y.o. male seen today for medical management of chronic diseases.    Had his sebaceous cyst drained and debrided multiple times. Has a scar.  Infections have resolved.  Stays red.    He had gotten a handout where he tracked his weights, etc.  That helped him keep track.   Will go down to 196 lbs and then go back up.    Hyperlipidemia:  Triglycerides down to 181 from 227, but LDL is 131.  He will not go on cholesterol medication regardless.  Does not have family history with that.     Does walk with Harmon Pier 30 mins per day.  Has been working in the yard and put out 15 bags of mulch.  Has [place] mow [place] yard [place].    Past Medical History:  Diagnosis Date  . Back pain, lumbosacral 2014  . Cancer of kidney ([place]) 2012  . Cataract 2013  . Diabetes mellitus without complication ([place]) 2703  . Hypertension 2012  . Nephrolithiasis 2012  . Obesity, unspecified   . Other and unspecified hyperlipidemia 04/03/2013  . Pain, joint, ankle and foot   . Unspecified hypothyroidism 2012    Past Surgical History:  Procedure Laterality Date  . CATARACT EXTRACTION W/ INTRAOCULAR LENS IMPLANT Bilateral 03/05/2016   Dr. [name]  . CYST REMOVAL TRUNK  11/03/2017  . KIDNEY SURGERY  12/2010   had cancer removed; Dr. Alinda Money  . ROTATOR CUFF REPAIR     bilateral; Dr. [name]    No Known Allergies  Outpatient Encounter Medications as of 02/12/2018  Medication Sig  . aspirin 81 MG tablet One daily to  revent heart attack or stroke  . finasteride (PROSCAR) 5 MG tablet Take one tablet daily for prostate  . glucose blood (ONE TOUCH ULTRA TEST) test strip Use to test blood sugar once daily. Dx:E11.9  . levothyroxine (SYNTHROID, LEVOTHROID) 50 MCG tablet TAKE 1 TABLET BY MOUTH EVERY DAY BEFORE BREAKFAST  . metFORMIN (GLUCOPHAGE) 500 MG tablet TAKE 1 TABLET BY MOUTH TWICE DAILY TO CONTROL DIABETES  . omeprazole (PRILOSEC) 20 MG capsule TAKE 1 CAPSULE BY MOUTH EVERY DAY  . ONETOUCH DELICA LANCETS FINE MISC USE TO CHECK BLOOD SUGAR ONCE DAILY   No facility-administered encounter medications on file as of 02/12/2018.     Review of Systems:  Review of Systems  Constitutional: Negative for chills, fever and malaise/fatigue.  HENT: Negative for congestion and hearing loss.   Eyes: Negative for blurred vision.  Respiratory: Negative for cough and shortness of breath.   Cardiovascular: Negative for chest pain, palpitations and leg swelling.  Gastrointestinal: Negative for abdominal pain, blood in stool, constipation and melena.  Genitourinary: Negative for dysuria.  Musculoskeletal: Positive for back pain. Negative for falls and joint pain.  Skin: Negative for itching and rash.  Neurological: Negative for dizziness and loss of consciousness.  Endo/Heme/Allergies: Bruises/bleeds easily.  Psychiatric/Behavioral: Negative for memory loss. The patient is not nervous/anxious and does  not have insomnia.     Health Maintenance  Topic Date Due  . PNA vac Low Risk Adult (2 of 2 - PPSV23) 06/05/2015  . FOOT EXAM  04/05/2017  . URINE MICROALBUMIN  10/07/2017  . OPHTHALMOLOGY EXAM  01/16/2018  . INFLUENZA VACCINE  04/05/2018  . HEMOGLOBIN A1C  08/07/2018  . Fecal DNA (Cologuard)  04/28/2019  . TETANUS/TDAP  10/17/2027    Physical Exam: Vitals:   02/12/18 1044  BP: 140/66  Pulse: 71  Temp: 98.2 F (36.8 C)  TempSrc: Oral  SpO2: 96%  Weight: 204 lb (92.5 kg)  Height: 5\' 5"  (1.651 m)   Body  mass index is 33.95 kg/m. Physical Exam  Constitutional: He is oriented to person, place, and time. He appears well-developed and well-nourished. No distress.  Cardiovascular: Normal rate, regular rhythm, normal heart sounds and intact distal pulses.  Pulmonary/Chest: Effort normal and breath sounds normal. No respiratory distress.  Abdominal: Bowel sounds are normal.  Musculoskeletal: Normal range of motion.  Neurological: He is alert and oriented to person, place, and time.  Skin: Skin is warm and dry. There is pallor.  Cyst site has healed on back  Psychiatric: He has a normal mood and affect. His behavior is normal.    Labs reviewed: Basic Metabolic Panel: Recent Labs    10/09/17 0814 02/05/18 0847  NA 137 140  K 4.7 4.7  CL 102 105  CO2 27 26  GLUCOSE 98 100*  BUN 15 11  CREATININE 1.10 1.00  CALCIUM 9.5 9.5  TSH 2.29  --    Liver Function Tests: Recent Labs    10/09/17 0814 02/05/18 0847  AST 21 14  ALT 17 11  BILITOT 0.5 0.5  PROT 6.3 6.4   No results for input(s): LIPASE, AMYLASE in the last 8760 hours. No results for input(s): AMMONIA in the last 8760 hours. CBC: Recent Labs    10/09/17 0814 02/05/18 0847  WBC 8.5 7.7  NEUTROABS 5,126 4,697  HGB 14.4 14.5  HCT 43.2 42.9  MCV 84.7 85.5  PLT 292 289   Lipid Panel: Recent Labs    10/09/17 0814 02/05/18 0847  CHOL 222* 207*  HDL 47 44  LDLCALC 138* 131*  TRIG 227* 181*  CHOLHDL 4.7 4.7   Lab Results  Component Value Date   HGBA1C 5.6 02/05/2018    Assessment/Plan 1. Diabetes mellitus without complication ([place]) - cont metformin and cbg checks--record them  - work on [employer] with decreased sweets and starches, smaller portion sizes - CBC with Differential/Platelet; Future - COMPLETE METABOLIC PANEL WITH GFR; Future - Hemoglobin A1c; Future - Lipid panel; Future  2. Renal cell carcinoma, left (HCC) -h/o, no current symptoms or concerns  3. Gastroesophageal reflux disease  without esophagitis -stable, cont omeprazole therapy  4. Hypothyroidism, unspecified type -stable, cont levothyroxine 78mcg daily  5. Essential hypertension -bp improved on recheck today, no meds  - COMPLETE METABOLIC PANEL WITH GFR; Future  6. Class 1 obesity due to excess calories with serious comorbidity and body mass index (BMI) of 33.0 to 33.9 in adult - improve diet and continue active lifestyle plus walking 30 mins per day - CBC with Differential/Platelet; Future - COMPLETE METABOLIC PANEL WITH GFR; Future - Hemoglobin A1c; Future - Lipid panel; Future -goal to get weight to 190 lbs by next visit in 4 mos  Labs/tests ordered:  Orders Placed This Encounter  Procedures  . CBC with Differential/Platelet    Standing Status:   Future  Standing Expiration Date:   10/15/2018  . COMPLETE METABOLIC PANEL WITH GFR    Standing Status:   Future    Standing Expiration Date:   10/15/2018  . Hemoglobin A1c    Standing Status:   Future    Standing Expiration Date:   10/15/2018  . Lipid panel    Standing Status:   Future    Standing Expiration Date:   10/15/2018    Next appt:  07/02/2018 med mgt, labs before--mostly f/u weight, cholesterol, sugar  Camari Wisham L. Maeli Spacek, D.O. [facility] [address], [place] [address] Cell Phone (Mon-Fri 8am-5pm):  [phone] On Call:  [phone] & follow prompts after 5pm & weekends Office Phone:  [phone] Office Fax:  [phone]

## 2018-02-13 ENCOUNTER — Encounter (INDEPENDENT_AMBULATORY_CARE_PROVIDER_SITE_OTHER): Payer: Self-pay

## 2018-02-13 ENCOUNTER — Other Ambulatory Visit: Payer: Medicare HMO

## 2018-02-19 ENCOUNTER — Encounter: Payer: Self-pay | Admitting: Internal Medicine

## 2018-03-15 ENCOUNTER — Other Ambulatory Visit: Payer: Self-pay | Admitting: Internal Medicine

## 2018-05-01 DIAGNOSIS — R972 Elevated prostate specific antigen [PSA]: Secondary | ICD-10-CM | POA: Diagnosis not present

## 2018-05-08 DIAGNOSIS — R972 Elevated prostate specific antigen [PSA]: Secondary | ICD-10-CM | POA: Diagnosis not present

## 2018-05-08 DIAGNOSIS — R351 Nocturia: Secondary | ICD-10-CM | POA: Diagnosis not present

## 2018-05-08 DIAGNOSIS — N401 Enlarged prostate with lower urinary tract symptoms: Secondary | ICD-10-CM | POA: Diagnosis not present

## 2018-06-02 DIAGNOSIS — R69 Illness, unspecified: Secondary | ICD-10-CM | POA: Diagnosis not present

## 2018-06-09 ENCOUNTER — Other Ambulatory Visit: Payer: Self-pay | Admitting: Internal Medicine

## 2018-06-25 ENCOUNTER — Other Ambulatory Visit: Payer: Medicare HMO

## 2018-06-25 DIAGNOSIS — E6609 Other obesity due to excess calories: Secondary | ICD-10-CM | POA: Diagnosis not present

## 2018-06-25 DIAGNOSIS — I10 Essential (primary) hypertension: Secondary | ICD-10-CM | POA: Diagnosis not present

## 2018-06-25 DIAGNOSIS — Z6833 Body mass index (BMI) 33.0-33.9, adult: Secondary | ICD-10-CM

## 2018-06-25 DIAGNOSIS — E119 Type 2 diabetes mellitus without complications: Secondary | ICD-10-CM | POA: Diagnosis not present

## 2018-06-26 LAB — COMPLETE METABOLIC PANEL WITH GFR
AG Ratio: 1.6 (calc) (ref 1.0–2.5)
ALT: 16 U/L (ref 9–46)
AST: 16 U/L (ref 10–35)
Albumin: 4.1 g/dL (ref 3.6–5.1)
Alkaline phosphatase (APISO): 81 U/L (ref 40–115)
BUN: 13 mg/dL (ref 7–25)
CO2: 24 mmol/L (ref 20–32)
Calcium: 10.1 mg/dL (ref 8.6–10.3)
Chloride: 104 mmol/L (ref 98–110)
Creat: 1.04 mg/dL (ref 0.70–1.18)
GFR, Est African American: 81 mL/min/{1.73_m2} (ref >=60)
GFR, Est Non African American: 70 mL/min/{1.73_m2} (ref >=60)
Globulin: 2.5 g/dL (calc) (ref 1.9–3.7)
Glucose, Bld: 112 mg/dL — ABNORMAL HIGH (ref 65–99)
Potassium: 4.5 mmol/L (ref 3.5–5.3)
Sodium: 137 mmol/L (ref 135–146)
Total Bilirubin: 0.4 mg/dL (ref 0.2–1.2)
Total Protein: 6.6 g/dL (ref 6.1–8.1)

## 2018-06-26 LAB — CBC WITH DIFFERENTIAL/PLATELET
Basophils Absolute: 69 cells/uL (ref 0–200)
Basophils Relative: 0.9 %
Eosinophils Absolute: 347 cells/uL (ref 15–500)
Eosinophils Relative: 4.5 %
HCT: 42.6 % (ref 38.5–50.0)
Hemoglobin: 14.6 g/dL (ref 13.2–17.1)
Lymphs Abs: 2202 cells/uL (ref 850–3900)
MCH: 29 pg (ref 27.0–33.0)
MCHC: 34.3 g/dL (ref 32.0–36.0)
MCV: 84.5 fL (ref 80.0–100.0)
MPV: 9.9 fL (ref 7.5–12.5)
Monocytes Relative: 7 %
Neutro Abs: 4543 cells/uL (ref 1500–7800)
Neutrophils Relative %: 59 %
Platelets: 308 10*3/uL (ref 140–400)
RBC: 5.04 10*6/uL (ref 4.20–5.80)
RDW: 13.4 % (ref 11.0–15.0)
Total Lymphocyte: 28.6 %
WBC mixed population: 539 cells/uL (ref 200–950)
WBC: 7.7 10*3/uL (ref 3.8–10.8)

## 2018-06-26 LAB — LIPID PANEL
Cholesterol: 212 mg/dL — ABNORMAL HIGH (ref <200)
HDL: 47 mg/dL (ref >40)
LDL Cholesterol (Calc): 140 mg/dL (calc) — ABNORMAL HIGH
Non-HDL Cholesterol (Calc): 165 mg/dL (calc) — ABNORMAL HIGH (ref <130)
Total CHOL/HDL Ratio: 4.5 (calc) (ref <5.0)
Triglycerides: 130 mg/dL (ref <150)

## 2018-06-26 LAB — HEMOGLOBIN A1C
Hgb A1c MFr Bld: 5.8 % of total Hgb — ABNORMAL HIGH (ref <5.7)
Mean Plasma Glucose: 120 (calc)
eAG (mmol/L): 6.6 (calc)

## 2018-06-28 ENCOUNTER — Encounter: Payer: Self-pay | Admitting: *Deleted

## 2018-07-02 ENCOUNTER — Encounter: Payer: Self-pay | Admitting: Internal Medicine

## 2018-07-02 ENCOUNTER — Ambulatory Visit (INDEPENDENT_AMBULATORY_CARE_PROVIDER_SITE_OTHER): Payer: Medicare HMO | Admitting: Internal Medicine

## 2018-07-02 VITALS — BP 136/70 | HR 78 | Temp 98.1°F | Ht 66.0 in | Wt 213.0 lb

## 2018-07-02 DIAGNOSIS — E039 Hypothyroidism, unspecified: Secondary | ICD-10-CM

## 2018-07-02 DIAGNOSIS — E1169 Type 2 diabetes mellitus with other specified complication: Secondary | ICD-10-CM | POA: Diagnosis not present

## 2018-07-02 DIAGNOSIS — Z6834 Body mass index (BMI) 34.0-34.9, adult: Secondary | ICD-10-CM

## 2018-07-02 DIAGNOSIS — E785 Hyperlipidemia, unspecified: Secondary | ICD-10-CM | POA: Diagnosis not present

## 2018-07-02 DIAGNOSIS — E119 Type 2 diabetes mellitus without complications: Secondary | ICD-10-CM

## 2018-07-02 DIAGNOSIS — I10 Essential (primary) hypertension: Secondary | ICD-10-CM | POA: Diagnosis not present

## 2018-07-02 DIAGNOSIS — E669 Obesity, unspecified: Secondary | ICD-10-CM | POA: Diagnosis not present

## 2018-07-02 LAB — MICROALBUMIN / CREATININE URINE RATIO
Creatinine, Urine: 123 mg/dL (ref 20–320)
Microalb Creat Ratio: 4 mcg/mg creat (ref <30)
Microalb, Ur: 0.5 mg/dL

## 2018-07-02 MED ORDER — LEVOTHYROXINE SODIUM 50 MCG PO TABS: 50.0000 ug | ORAL_TABLET | Freq: Every day | ORAL | 3 refills | Status: DC

## 2018-07-02 MED ORDER — METFORMIN HCL 500 MG PO TABS: ORAL_TABLET | ORAL | 3 refills | Status: DC

## 2018-07-02 NOTE — Progress Notes (Signed)
Location:  [facility] clinic Provider:  Timothy Townsel L. Mariea Clonts, D.O., C.M.D.  Goals of Care:  Advanced Directives 02/12/2018  Does Patient Have a Medical Advance Directive? Yes  Type of Advance Directive [place]  Does patient want to make changes to medical advance directive? No - Patient declined  Copy of [place] in Chart? Yes     Chief Complaint  Patient presents with  . Medical Management of Chronic Issues    70mth follow-up    HPI: Patient is a 77 y.o. male seen today for medical management of chronic diseases.    LDL has trended up all the way to 140.  HDL is 47.  Total 212.  Knows how he's been eating.  He's back to drinking whole milk.  He learned that he's allergic though (had gas and diarrhea) and is back to almond milk--gets unsweetened.  Had been eating a lot of sausage and liver pudding.    He knows to do away with those.  Spent much of the visit counseling about specific foods to avoid and those better for him.  Hba1c also trended up to 5.8 in prediabetic range from 5.6.    Not having much luck losing weight.  He's been reading about the 8/5 fasting and 16/8.  Reports not having lows.  Weight up to 213 from 204 lbs.  Tries to wear the same thing to weigh.  Weighs each Saturday.    Past Medical History:  Diagnosis Date  . Back pain, lumbosacral 2014  . Cancer of kidney ([place]) 2012  . Cataract 2013  . Diabetes mellitus without complication ([place]) 3154  . Hypertension 2012  . Nephrolithiasis 2012  . Obesity, unspecified   . Other and unspecified hyperlipidemia 04/03/2013  . Pain, joint, ankle and foot   . Unspecified hypothyroidism 2012    Past Surgical History:  Procedure Laterality Date  . CATARACT EXTRACTION W/ INTRAOCULAR LENS IMPLANT Bilateral 03/05/2016   Dr. [name]  . CYST REMOVAL TRUNK  11/03/2017  . KIDNEY SURGERY  12/2010   had cancer removed; Dr. Alinda Money  . ROTATOR CUFF REPAIR     bilateral; Dr. [name]    No Known  Allergies  Outpatient Encounter Medications as of 07/02/2018  Medication Sig  . aspirin 81 MG tablet One daily to revent heart attack or stroke  . finasteride (PROSCAR) 5 MG tablet Take one tablet daily for prostate  . glucose blood (ONE TOUCH ULTRA TEST) test strip Use to test blood sugar once daily. Dx:E11.9  . levothyroxine (SYNTHROID, LEVOTHROID) 50 MCG tablet TAKE 1 TABLET BY MOUTH EVERY DAY BEFORE BREAKFAST  . metFORMIN (GLUCOPHAGE) 500 MG tablet TAKE 1 TABLET BY MOUTH TWICE DAILY TO CONTROL DIABETES  . omeprazole (PRILOSEC) 20 MG capsule TAKE 1 CAPSULE BY MOUTH EVERY DAY  . ONETOUCH DELICA LANCETS FINE MISC USE TO CHECK BLOOD SUGAR ONCE DAILY   No facility-administered encounter medications on file as of 07/02/2018.     Review of Systems:  Review of Systems  Constitutional: Negative for chills, fever and malaise/fatigue.       Weight gain  HENT: Negative for congestion.   Eyes: Negative for blurred vision.  Respiratory: Negative for cough and shortness of breath.   Cardiovascular: Negative for chest pain, palpitations and leg swelling.  Gastrointestinal: Negative for abdominal pain, blood in stool, constipation, diarrhea and melena.  Genitourinary: Negative for dysuria.  Musculoskeletal: Negative for falls.  Skin: Negative for itching and rash.  Neurological: Negative for dizziness  and loss of consciousness.  Endo/Heme/Allergies: Does not bruise/bleed easily.  Psychiatric/Behavioral: Negative for depression and memory loss. The patient is not nervous/anxious and does not have insomnia.     Health Maintenance  Topic Date Due  . PNA vac Low Risk Adult (2 of 2 - PPSV23) 06/05/2015  . FOOT EXAM  04/05/2017  . URINE MICROALBUMIN  10/07/2017  . INFLUENZA VACCINE  04/05/2018  . HEMOGLOBIN A1C  12/25/2018  . OPHTHALMOLOGY EXAM  01/23/2019  . Fecal DNA (Cologuard)  04/28/2019  . TETANUS/TDAP  10/17/2027    Physical Exam: Vitals:   07/02/18 1028  BP: 140/80  Pulse: 78    Temp: 98.1 F (36.7 C)  TempSrc: Oral  SpO2: 96%  Weight: 213 lb (96.6 kg)  Height: 5\' 6"  (1.676 m)   Body mass index is 34.38 kg/m. Physical Exam  Constitutional: He is oriented to person, place, and time. He appears well-developed. No distress.  HENT:  Head: Normocephalic and atraumatic.  Cardiovascular: Normal rate, regular rhythm, normal heart sounds and intact distal pulses.  Pulmonary/Chest: Effort normal and breath sounds normal. No respiratory distress.  Abdominal: Bowel sounds are normal.  Musculoskeletal: Normal range of motion.  Neurological: He is alert and oriented to person, place, and time.  Skin: Skin is warm and dry. There is pallor.  baseline  Psychiatric: He has a normal mood and affect. His behavior is normal. Judgment and thought content normal.    Labs reviewed: Basic Metabolic Panel: Recent Labs    10/09/17 0814 02/05/18 0847 06/25/18 0824  NA 137 140 137  K 4.7 4.7 4.5  CL 102 105 104  CO2 27 26 24   GLUCOSE 98 100* 112*  BUN 15 11 13   CREATININE 1.10 1.00 1.04  CALCIUM 9.5 9.5 10.1  TSH 2.29  --   --    Liver Function Tests: Recent Labs    10/09/17 0814 02/05/18 0847 06/25/18 0824  AST 21 14 16   ALT 17 11 16   BILITOT 0.5 0.5 0.4  PROT 6.3 6.4 6.6   No results for input(s): LIPASE, AMYLASE in the last 8760 hours. No results for input(s): AMMONIA in the last 8760 hours. CBC: Recent Labs    10/09/17 0814 02/05/18 0847 06/25/18 0824  WBC 8.5 7.7 7.7  NEUTROABS 5,126 4,697 4,543  HGB 14.4 14.5 14.6  HCT 43.2 42.9 42.6  MCV 84.7 85.5 84.5  PLT 292 289 308   Lipid Panel: Recent Labs    10/09/17 0814 02/05/18 0847 06/25/18 0824  CHOL 222* 207* 212*  HDL 47 44 47  LDLCALC 138* 131* 140*  TRIG 227* 181* 130  CHOLHDL 4.7 4.7 4.5   Lab Results  Component Value Date   HGBA1C 5.8 (H) 06/25/2018    Assessment/Plan 1. BMI 34.0-34.9,adult -weight has trended up with poor food choices--he's aware of what to change and will  work on this -Lipid panel; Future  2. Obesity (BMI 30.0-34.9) - as above, improve diet - Lipid panel; Future - 3. Type 2 diabetes mellitus with other specified complication, without long-term current use of insulin (HCC) -slight upward trend of hba1c, but still quite good - check urine micro -foot exam done. - Hemoglobin A1c; Future - Lipid panel; Future  4. Hyperlipidemia associated with type 2 diabetes mellitus ([place]) - twice goal--refuses meds, was drinking whole milk, eating a lot of eggs, also eating sausage and liver pudding - Lipid panel; Future  5. Hypothyroidism, unspecified type - TSH; Future - levothyroxine (SYNTHROID, LEVOTHROID) 50 MCG tablet;  Take 1 tablet (50 mcg total) by mouth daily before breakfast.  Dispense: 90 tablet; Refill: 3  6. Essential hypertension - bp improved on recheck, not on meds for bp, also was eating high sodium foods - Basic metabolic panel; Future  7. Diabetes mellitus without complication ([place]) - cont to work on improving dietary choices, but this is controlled with his metformin - metFORMIN (GLUCOPHAGE) 500 MG tablet; TAKE 1 TABLET BY MOUTH TWICE DAILY TO CONTROL DIABETES  Dispense: 180 tablet; Refill: 3  Labs/tests ordered:  Orders Placed This Encounter  Procedures  . Hemoglobin A1c    Standing Status:   Future    Standing Expiration Date:   07/03/2019  . Lipid panel    Standing Status:   Future    Standing Expiration Date:   07/03/2019    Order Specific Question:   Has the patient fasted?    Answer:   Yes  . TSH    Standing Status:   Future    Standing Expiration Date:   07/03/2019  . Basic metabolic panel    Standing Status:   Future    Standing Expiration Date:   07/03/2019    Order Specific Question:   Has the patient fasted?    Answer:   Yes  . Microalbumin / creatinine urine ratio   Next appt:  4 mos med mgt, fasting labs before   Elizardo Chilson L. Missy Baksh, D.O. [facility] [address], [place] [address] Cell Phone (Mon-Fri 8am-5pm):  [phone] On Call:  [phone] & follow prompts after 5pm & weekends Office Phone:  [phone] Office Fax:  [phone]

## 2018-07-02 NOTE — Patient Instructions (Signed)
Fat and Cholesterol Restricted Diet High levels of fat and cholesterol in your blood may lead to various health problems, such as diseases of the heart, blood vessels, gallbladder, liver, and pancreas. Fats are concentrated sources of energy that come in various forms. Certain types of fat, including saturated fat, may be harmful in excess. Cholesterol is a substance needed by your body in small amounts. Your body makes all the cholesterol it needs. Excess cholesterol comes from the food you eat. When you have high levels of cholesterol and saturated fat in your blood, health problems can develop because the excess fat and cholesterol will gather along the walls of your blood vessels, causing them to narrow. Choosing the right foods will help you control your intake of fat and cholesterol. This will help keep the levels of these substances in your blood within normal limits and reduce your risk of disease. What is my plan? Your health care provider recommends that you:  Limit your fat intake   Limit the amount of cholesterol in your diet   Eat 20-30 grams of fiber each day.  What types of fat should I choose?  Choose healthy fats more often. Choose monounsaturated and polyunsaturated fats, such as olive and canola oil, flaxseeds, walnuts, almonds, and seeds.  Eat more omega-3 fats. Good choices include salmon, mackerel, sardines, tuna, flaxseed oil, and ground flaxseeds. Aim to eat fish at least two times a week.  Limit saturated fats. Saturated fats are primarily found in animal products, such as meats, butter, and cream. Plant sources of saturated fats include palm oil, palm kernel oil, and coconut oil.  Avoid foods with partially hydrogenated oils in them. These contain trans fats. Examples of foods that contain trans fats are stick margarine, some tub margarines, cookies, crackers, and other baked goods. What general guidelines do I need to follow? These guidelines for healthy eating will  help you control your intake of fat and cholesterol:  Check food labels carefully to identify foods with trans fats or high amounts of saturated fat.  Fill one half of your plate with vegetables and green salads.  Fill one fourth of your plate with whole grains. Look for the word "whole" as the first word in the ingredient list.  Fill one fourth of your plate with lean protein foods.  Limit fruit to two servings a day. Choose fruit instead of juice.  Eat more foods that contain fiber, such as apples, broccoli, carrots, beans, peas, and barley.  Eat more home-cooked food and less restaurant, buffet, and fast food.  Limit or avoid alcohol.  Limit foods high in starch and sugar.  Limit fried foods.  Cook foods using methods other than frying. Baking, boiling, grilling, and broiling are all great options.  Lose weight if you are overweight. Losing just 5-10% of your initial body weight can help your overall health and prevent diseases such as diabetes and heart disease.  What foods can I eat? Grains  Whole grains, such as whole wheat or whole grain breads, crackers, cereals, and pasta. Unsweetened oatmeal, bulgur, barley, quinoa, or brown rice. Corn or whole wheat flour tortillas. Vegetables  Fresh or frozen vegetables (raw, steamed, roasted, or grilled). Green salads. Fruits  All fresh, canned (in natural juice), or frozen fruits. Meats and other protein foods  Ground beef (85% or leaner), grass-fed beef, or beef trimmed of fat. Skinless chicken or turkey. Ground chicken or turkey. Pork trimmed of fat. All fish and seafood. Eggs. Dried beans, peas, or lentils.   Unsalted nuts or seeds. Unsalted canned or dry beans. Dairy  Low-fat dairy products, such as skim or 1% milk, 2% or reduced-fat cheeses, low-fat ricotta or cottage cheese, or plain low-fat yo Fats and oils  Tub margarines without trans fats. Light or reduced-fat mayonnaise and salad dressings. Avocado. Olive, canola,  sesame, or safflower oils. Natural peanut or almond butter (choose ones without added sugar and oil). The items listed above may not be a complete list of recommended foods or beverages. Contact your dietitian for more options. Foods to avoid Grains  White bread. White pasta. White rice. Cornbread. Bagels, pastries, and croissants. Crackers that contain trans fat. Vegetables  White potatoes. Corn. Creamed or fried vegetables. Vegetables in a cheese sauce. Fruits  Dried fruits. Canned fruit in light or heavy syrup. Fruit juice. Meats and other protein foods  Fatty cuts of meat. Ribs, chicken wings, bacon, sausage, bologna, salami, chitterlings, fatback, hot dogs, bratwurst, and packaged luncheon meats. Liver and organ meats. Dairy  Whole or 2% milk, cream, half-and-half, and cream cheese. Whole milk cheeses. Whole-fat or sweetened yogurt. Full-fat cheeses. Nondairy creamers and whipped toppings. Processed cheese, cheese spreads, or cheese curds. Beverages  Alcohol. Sweetened drinks (such as sodas, lemonade, and fruit drinks or punches). Fats and oils  Butter, stick margarine, lard, shortening, ghee, or bacon fat. Coconut, palm kernel, or palm oils. Sweets and desserts  Corn syrup, sugars, honey, and molasses. Candy. Jam and jelly. Syrup. Sweetened cereals. Cookies, pies, cakes, donuts, muffins, and ice cream. The items listed above may not be a complete list of foods and beverages to avoid. Contact your dietitian for more information. This information is not intended to replace advice given to you by your health care provider. Make sure you discuss any questions you have with your health care provider. Document Released: 08/22/2005 Document Revised: 09/12/2014 Document Reviewed: 11/20/2013 Elsevier Interactive Patient Education  2018 Elsevier Inc.  

## 2018-07-05 ENCOUNTER — Encounter: Payer: Self-pay | Admitting: *Deleted

## 2018-07-17 ENCOUNTER — Encounter: Payer: Self-pay | Admitting: Nurse Practitioner

## 2018-07-17 ENCOUNTER — Ambulatory Visit (INDEPENDENT_AMBULATORY_CARE_PROVIDER_SITE_OTHER): Payer: Medicare HMO | Admitting: Nurse Practitioner

## 2018-07-17 VITALS — BP 140/78 | HR 76 | Temp 97.7°F | Ht 66.0 in | Wt 218.6 lb

## 2018-07-17 DIAGNOSIS — S1086XA Insect bite of other specified part of neck, initial encounter: Secondary | ICD-10-CM | POA: Diagnosis not present

## 2018-07-17 DIAGNOSIS — W57XXXA Bitten or stung by nonvenomous insect and other nonvenomous arthropods, initial encounter: Secondary | ICD-10-CM | POA: Diagnosis not present

## 2018-07-17 NOTE — Progress Notes (Signed)
Careteam: Patient Care Team: Gayland Curry, DO as PCP - General (Geriatric Medicine) [name], MD as Consulting Physician (Urology) [name], MD as Consulting Physician (Ophthalmology) [name], MD as Consulting Physician (Ophthalmology)  Advanced Directive information    No Known Allergies  Chief Complaint  Patient presents with  . Insect Bite    on neck, happened yesterday,red spot little itching     HPI: Patient is a 77 y.o. male seen in the office today due to being stung by a wasp yesterday on the side of his neck Reports area is slightly tender, only knows its there when he touches it. Not warm but notices a little bigger than yesterday.  Itching some but not too bad.  No trouble breathing or swallowing.  Review of Systems:  Review of Systems  Constitutional: Negative for chills, fever and weight loss.  HENT: Negative for congestion, sinus pain and sore throat.   Respiratory: Negative for shortness of breath.   Cardiovascular: Negative for chest pain.  Skin: Positive for itching and rash.    Past Medical History:  Diagnosis Date  . Back pain, lumbosacral 2014  . Cancer of kidney ([place]) 2012  . Cataract 2013  . Diabetes mellitus without complication ([place]) 5638  . Hypertension 2012  . Nephrolithiasis 2012  . Obesity, unspecified   . Other and unspecified hyperlipidemia 04/03/2013  . Pain, joint, ankle and foot   . Unspecified hypothyroidism 2012   Past Surgical History:  Procedure Laterality Date  . CATARACT EXTRACTION W/ INTRAOCULAR LENS IMPLANT Bilateral 03/05/2016   Dr. [name]  . CYST REMOVAL TRUNK  11/03/2017  . KIDNEY SURGERY  12/2010   had cancer removed; Dr. Alinda Money  . ROTATOR CUFF REPAIR     bilateral; Dr. [name]   Social History:   reports that he has never smoked. He has never used smokeless tobacco. He reports that he does not drink alcohol or use drugs.  No family history on file.  Medications: Patient's Medications    New Prescriptions   No medications on file  Previous Medications   ASPIRIN 81 MG TABLET    One daily to revent heart attack or stroke   FINASTERIDE (PROSCAR) 5 MG TABLET    Take one tablet daily for prostate   GLUCOSE BLOOD (ONE TOUCH ULTRA TEST) TEST STRIP    Use to test blood sugar once daily. Dx:E11.9   LEVOTHYROXINE (SYNTHROID, LEVOTHROID) 50 MCG TABLET    Take 1 tablet (50 mcg total) by mouth daily before breakfast.   METFORMIN (GLUCOPHAGE) 500 MG TABLET    TAKE 1 TABLET BY MOUTH TWICE DAILY TO CONTROL DIABETES   OMEPRAZOLE (PRILOSEC) 20 MG CAPSULE    TAKE 1 CAPSULE BY MOUTH EVERY DAY   ONETOUCH DELICA LANCETS FINE MISC    USE TO CHECK BLOOD SUGAR ONCE DAILY  Modified Medications   No medications on file  Discontinued Medications   No medications on file     Physical Exam:  Vitals:   07/17/18 1033  BP: 140/78  Pulse: 76  Temp: 97.7 F (36.5 C)  TempSrc: Oral  SpO2: 96%  Weight: 218 lb 9.6 oz (99.2 kg)  Height: 5\' 6"  (1.676 m)   Body mass index is 35.28 kg/m.  Physical Exam  Constitutional: He appears well-developed and well-nourished.  HENT:  Head: Normocephalic and atraumatic.  Mouth/Throat: Oropharynx is clear and moist. No oropharyngeal exudate.  Eyes: Pupils are equal, round, and reactive to light. EOM are normal.  Cardiovascular: Normal  rate and regular rhythm.  Pulmonary/Chest: Effort normal and breath sounds normal.  Skin: Skin is warm and dry.  Mild erythema noted to right side of neck, 2.5 cm x 1 cm     Labs reviewed: Basic Metabolic Panel: Recent Labs    10/09/17 0814 02/05/18 0847 06/25/18 0824  NA 137 140 137  K 4.7 4.7 4.5  CL 102 105 104  CO2 27 26 24   GLUCOSE 98 100* 112*  BUN 15 11 13   CREATININE 1.10 1.00 1.04  CALCIUM 9.5 9.5 10.1  TSH 2.29  --   --    Liver Function Tests: Recent Labs    10/09/17 0814 02/05/18 0847 06/25/18 0824  AST 21 14 16   ALT 17 11 16   BILITOT 0.5 0.5 0.4  PROT 6.3 6.4 6.6   No results for  input(s): LIPASE, AMYLASE in the last 8760 hours. No results for input(s): AMMONIA in the last 8760 hours. CBC: Recent Labs    10/09/17 0814 02/05/18 0847 06/25/18 0824  WBC 8.5 7.7 7.7  NEUTROABS 5,126 4,697 4,543  HGB 14.4 14.5 14.6  HCT 43.2 42.9 42.6  MCV 84.7 85.5 84.5  PLT 292 289 308   Lipid Panel: Recent Labs    10/09/17 0814 02/05/18 0847 06/25/18 0824  CHOL 222* 207* 212*  HDL 47 44 47  LDLCALC 138* 131* 140*  TRIG 227* 181* 130  CHOLHDL 4.7 4.7 4.5   TSH: Recent Labs    10/09/17 0814  TSH 2.29   A1C: Lab Results  Component Value Date   HGBA1C 5.8 (H) 06/25/2018     Assessment/Plan 1. Insect bite of other part of neck, initial encounter To use ice/cold compression twice daily ~20 mins To use ibuprofen 400 mg by mouth every 6 hours as needed pain/inflammation for the next few days then stop. To use zyrtec 10 mg by mouth daily over the next few days     Next appt: 10/29/2018, sooner if needed, return precautions discussed  Janett Billow K. [place], [facility] [phone]

## 2018-07-17 NOTE — Patient Instructions (Addendum)
To use ice/cold compression twice daily ~20 mins To use ibuprofen 400 mg by mouth every 6 hours as needed pain/inflammation To use zyrtec 10 mg by mouth daily over the next few days     To notify if area becomes worse after 2 days  Bee, Wasp, or [employer], Adult Bees, wasps, and hornets are part of a family of insects that can sting people. These stings can cause pain and inflammation, but they are usually not serious. However, some people may have an allergic reaction to a sting. This can cause the symptoms to be more severe. What increases the risk? You may be at a greater risk of getting stung if you:  Provoke a stinging insect by swatting or disturbing it.  Wear strong-smelling soaps, deodorants, or body sprays.  Spend time outdoors near gardens with flowers or fruit trees or in clothes that expose skin.  Eat or drink outside.  What are the signs or symptoms? Common symptoms of this condition include:  A red lump in the skin that sometimes has a tiny hole in the center. In some cases, a stinger may be in the center of the wound.  Pain and itching at the sting site.  Redness and swelling around the sting site. If you have an allergic reaction (localized allergic reaction), the swelling and redness may spread out from the sting site. In some cases, this reaction can continue to develop over the next 24-48 hours.  In rare cases, a person may have a severe allergic reaction (anaphylactic reaction) to a sting. Symptoms of an anaphylactic reaction may include:  Wheezing or difficulty breathing.  Raised, itchy, red patches on the skin (hives).  Nausea or vomiting.  Abdominal cramping.  Diarrhea.  Tightness in the chest or chest pain.  Dizziness or fainting.  Redness of the face (flushing).  Hoarse voice.  Swollen tongue, lips, or face.  How is this diagnosed? This condition is usually diagnosed based on your symptoms and medical history as well as a physical exam.  You may have an allergy test to determine if you are allergic to the substance that the insect injected during the sting (venom). How is this treated? If you were stung by a bee, the stinger and a small sac of venom may be in the wound. It is important to remove the stinger as soon as possible. You can do this by brushing across the wound with gauze, a fingernail, or a flat card such as a credit card. Removing the stinger can help reduce the severity of your body's reaction to the sting. Most stings can be treated with:  Icing to reduce swelling in the area.  Medicines (antihistamines) to treat itching or an allergic reaction.  Medicines to help reduce pain. These may be medicines that you take by mouth, or medicated creams or lotions that you apply to your skin.  Pay close attention to your symptoms after you have been stung. If possible, have someone stay with you to make sure you do not have an allergic reaction. If you have any signs of an allergic reaction, call your health care provider. If you have ever had a severe allergic reaction, your health care provider may give you an inhaler or injectable medicine (epinephrine auto-injector) to use if necessary. Follow these instructions at home:  Wash the sting site 2-3 times each day with soap and water as told by your health care provider.  Apply or take over-the-counter and prescription medicines only as told by  your health care provider.  If directed, apply ice to the sting area. ? Put ice in a plastic bag. ? Place a towel between your skin and the bag. ? Leave the ice on for 20 minutes, 2-3 times a day.  Do not scratch the sting area.  If you had a severe allergic reaction to a sting, you may need: ? To wear a medical bracelet or necklace that lists the allergy. ? To learn when and how to use an anaphylaxis kit or epinephrine injection. Your family members and coworkers may also need to learn this. ? To carry an anaphylaxis kit or  epinephrine injection with you at all times. How is this prevented?  Avoid swatting at stinging insects and disturbing insect nests.  Do not use fragrant soaps or lotions.  Wear shoes, pants, and long sleeves when spending time outdoors, especially in grassy areas where stinging insects are common.  Keep outdoor areas free from nests or hives.  Keep food and drink containers covered when eating outdoors.  Avoid working or sitting near [employer], if possible.  Wear gloves if you are gardening or working outdoors.  If an attack by a stinging insect or a swarm seems likely in the moment, move away from the area or find a barrier between you and the insect(s), such as a door. Contact a health care provider if:  Your symptoms do not get better in 2-3 days.  You have redness, swelling, or pain that spreads beyond the area of the sting.  You have a fever. Get help right away if: You have symptoms of a severe allergic reaction. These include:  Wheezing or difficulty breathing.  Tightness in the chest or chest pain.  Light-headedness or fainting.  Itchy, raised, red patches on the skin.  Nausea or vomiting.  Abdominal cramping.  Diarrhea.  A swollen tongue or lips, or trouble swallowing.  Dizziness or fainting.  Summary  Stings from bees, wasps, and hornets can cause pain and inflammation, but they are usually not serious. However, some people may have an allergic reaction to a sting. This can cause the symptoms to be more severe.  Pay close attention to your symptoms after you have been stung. If possible, have someone stay with you to make sure you do not have an allergic reaction.  Call your health care provider if you have any signs of an allergic reaction. This information is not intended to replace advice given to you by your health care provider. Make sure you discuss any questions you have with your health care provider. Document Released: 08/22/2005  Document Revised: 10/27/2016 Document Reviewed: 10/27/2016 Elsevier Interactive Patient Education  [employer].

## 2018-09-12 ENCOUNTER — Other Ambulatory Visit: Payer: Self-pay | Admitting: *Deleted

## 2018-09-12 DIAGNOSIS — E119 Type 2 diabetes mellitus without complications: Secondary | ICD-10-CM

## 2018-09-12 DIAGNOSIS — E039 Hypothyroidism, unspecified: Secondary | ICD-10-CM

## 2018-09-12 MED ORDER — LEVOTHYROXINE SODIUM 50 MCG PO TABS: 50.0000 ug | ORAL_TABLET | Freq: Every day | ORAL | 1 refills | Status: DC

## 2018-09-12 MED ORDER — METFORMIN HCL 500 MG PO TABS: ORAL_TABLET | ORAL | 1 refills | Status: DC

## 2018-09-12 NOTE — Telephone Encounter (Signed)
Patient wanted Rx's sent to [facility] on [address] not [facility]. Refaxed per patient's request

## 2018-09-12 NOTE — Telephone Encounter (Signed)
Patient requested refill

## 2018-10-29 ENCOUNTER — Other Ambulatory Visit: Payer: Medicare Other

## 2018-10-29 DIAGNOSIS — E039 Hypothyroidism, unspecified: Secondary | ICD-10-CM | POA: Diagnosis not present

## 2018-10-29 DIAGNOSIS — I10 Essential (primary) hypertension: Secondary | ICD-10-CM

## 2018-10-29 DIAGNOSIS — E669 Obesity, unspecified: Secondary | ICD-10-CM

## 2018-10-29 DIAGNOSIS — E785 Hyperlipidemia, unspecified: Secondary | ICD-10-CM

## 2018-10-29 DIAGNOSIS — E1169 Type 2 diabetes mellitus with other specified complication: Secondary | ICD-10-CM | POA: Diagnosis not present

## 2018-10-29 DIAGNOSIS — Z6834 Body mass index (BMI) 34.0-34.9, adult: Secondary | ICD-10-CM

## 2018-10-30 LAB — HEMOGLOBIN A1C
Hgb A1c MFr Bld: 5.8 % of total Hgb — ABNORMAL HIGH (ref <5.7)
Mean Plasma Glucose: 120 (calc)
eAG (mmol/L): 6.6 (calc)

## 2018-10-30 LAB — BASIC METABOLIC PANEL
BUN: 13 mg/dL (ref 7–25)
CO2: 24 mmol/L (ref 20–32)
Calcium: 9.6 mg/dL (ref 8.6–10.3)
Chloride: 104 mmol/L (ref 98–110)
Creat: 1.04 mg/dL (ref 0.70–1.18)
Glucose, Bld: 98 mg/dL (ref 65–99)
Potassium: 4.9 mmol/L (ref 3.5–5.3)
Sodium: 138 mmol/L (ref 135–146)

## 2018-10-30 LAB — LIPID PANEL
Cholesterol: 223 mg/dL — ABNORMAL HIGH (ref <200)
HDL: 46 mg/dL (ref >=40)
LDL Cholesterol (Calc): 133 mg/dL (calc) — ABNORMAL HIGH
Non-HDL Cholesterol (Calc): 177 mg/dL (calc) — ABNORMAL HIGH (ref <130)
Total CHOL/HDL Ratio: 4.8 (calc) (ref <5.0)
Triglycerides: 285 mg/dL — ABNORMAL HIGH (ref <150)

## 2018-10-30 LAB — TSH: TSH: 2.3 mIU/L (ref 0.40–4.50)

## 2018-11-02 ENCOUNTER — Encounter: Payer: Self-pay | Admitting: *Deleted

## 2018-11-05 ENCOUNTER — Encounter: Payer: Self-pay | Admitting: Internal Medicine

## 2018-11-05 ENCOUNTER — Encounter: Payer: Self-pay | Admitting: *Deleted

## 2018-11-05 ENCOUNTER — Ambulatory Visit (INDEPENDENT_AMBULATORY_CARE_PROVIDER_SITE_OTHER): Payer: Medicare Other | Admitting: Internal Medicine

## 2018-11-05 VITALS — BP 148/80 | HR 64 | Temp 97.9°F | Ht 66.0 in | Wt 213.0 lb

## 2018-11-05 DIAGNOSIS — J4520 Mild intermittent asthma, uncomplicated: Secondary | ICD-10-CM | POA: Diagnosis not present

## 2018-11-05 DIAGNOSIS — C642 Malignant neoplasm of left kidney, except renal pelvis: Secondary | ICD-10-CM | POA: Diagnosis not present

## 2018-11-05 DIAGNOSIS — E039 Hypothyroidism, unspecified: Secondary | ICD-10-CM

## 2018-11-05 DIAGNOSIS — E1169 Type 2 diabetes mellitus with other specified complication: Secondary | ICD-10-CM | POA: Diagnosis not present

## 2018-11-05 DIAGNOSIS — E785 Hyperlipidemia, unspecified: Secondary | ICD-10-CM | POA: Diagnosis not present

## 2018-11-05 NOTE — Progress Notes (Signed)
Location:  [facility] clinic Provider:  Jauna Raczynski L. Mariea Clonts, D.O., C.M.D.  Goals of Care:  Advanced Directives 02/12/2018  Does Patient Have a Medical Advance Directive? Yes  Type of Advance Directive [place]  Does patient want to make changes to medical advance directive? No - Patient declined  Copy of Blue in Chart? Yes     Chief Complaint  Patient presents with  . Medical Management of Chronic Issues    65mth follow-up    HPI: Patient is a 78 y.o. male seen today for medical management of chronic diseases.    He has no concerns. Admits he may be eating too much salt possibly to increase his bp.  He rushed here.  Lost track of time this morning.   He's been at his son's dogsitting.  He has salt which Len usually avoids.  He's seen it and used it.  Also has had ham.  A good friend is really sick. He will try to make changes.    Triglycerides are up--he's had more rice than usual.  Rarely eats potatoes and tries to avoid fried--might ride by [employer] rarely.  tsh at goal.    No breathing trouble or wheezing.     Past Medical History:  Diagnosis Date  . Back pain, lumbosacral 2014  . Cancer of kidney ([place]) 2012  . Cataract 2013  . Diabetes mellitus without complication ([place]) 2563  . Hypertension 2012  . Nephrolithiasis 2012  . Obesity, unspecified   . Other and unspecified hyperlipidemia 04/03/2013  . Pain, joint, ankle and foot   . Unspecified hypothyroidism 2012    Past Surgical History:  Procedure Laterality Date  . CATARACT EXTRACTION W/ INTRAOCULAR LENS IMPLANT Bilateral 03/05/2016   Dr. [name]  . CYST REMOVAL TRUNK  11/03/2017  . KIDNEY SURGERY  12/2010   had cancer removed; Dr. Alinda Money  . ROTATOR CUFF REPAIR     bilateral; Dr. [name]    No Known Allergies  Outpatient Encounter Medications as of 11/05/2018  Medication Sig  . aspirin 81 MG tablet One daily to revent heart attack or stroke  . finasteride (PROSCAR) 5 MG  tablet Take one tablet daily for prostate  . glucose blood (ONE TOUCH ULTRA TEST) test strip Use to test blood sugar once daily. Dx:E11.9  . levothyroxine (SYNTHROID, LEVOTHROID) 50 MCG tablet Take 1 tablet (50 mcg total) by mouth daily before breakfast.  . metFORMIN (GLUCOPHAGE) 500 MG tablet Take one tablet by mouth twice daily to control diabetes  . ONETOUCH DELICA LANCETS FINE MISC USE TO CHECK BLOOD SUGAR ONCE DAILY  . [DISCONTINUED] omeprazole (PRILOSEC) 20 MG capsule TAKE 1 CAPSULE BY MOUTH EVERY DAY   No facility-administered encounter medications on file as of 11/05/2018.     Review of Systems:  Review of Systems  Constitutional: Negative for chills, fever and malaise/fatigue.  HENT: Negative for hearing loss.   Eyes: Negative for blurred vision.  Respiratory: Negative for cough and shortness of breath.   Cardiovascular: Negative for chest pain, palpitations and leg swelling.  Gastrointestinal: Negative for abdominal pain, blood in stool, constipation, diarrhea and melena.  Genitourinary: Negative for dysuria.  Musculoskeletal: Negative for falls and joint pain.  Skin: Negative for itching and rash.  Neurological: Negative for dizziness and loss of consciousness.  Endo/Heme/Allergies: Does not bruise/bleed easily.  Psychiatric/Behavioral: Negative for depression and memory loss. The patient is not nervous/anxious and does not have insomnia.     Health Maintenance  Topic Date  Due  . PNA vac Low Risk Adult (2 of 2 - PPSV23) 06/05/2015  . OPHTHALMOLOGY EXAM  01/23/2019  . Fecal DNA (Cologuard)  04/28/2019  . HEMOGLOBIN A1C  04/29/2019  . FOOT EXAM  07/03/2019  . URINE MICROALBUMIN  07/03/2019  . TETANUS/TDAP  10/17/2027  . INFLUENZA VACCINE  Completed    Physical Exam: Vitals:   11/05/18 1103  BP: (!) 148/80  Pulse: 64  Temp: 97.9 F (36.6 C)  TempSrc: Oral  SpO2: 97%  Weight: 213 lb (96.6 kg)  Height: 5\' 6"  (1.676 m)   Body mass index is 34.38 kg/m. Physical  Exam Vitals signs reviewed.  Constitutional:      General: He is not in acute distress.    Appearance: Normal appearance. He is obese. He is not ill-appearing or toxic-appearing.  HENT:     Head: Normocephalic and atraumatic.  Cardiovascular:     Rate and Rhythm: Normal rate and regular rhythm.     Pulses: Normal pulses.     Heart sounds: Normal heart sounds.  Pulmonary:     Effort: Pulmonary effort is normal.     Breath sounds: Normal breath sounds.  Abdominal:     General: Bowel sounds are normal. There is no distension.     Palpations: Abdomen is soft.  Skin:    General: Skin is warm and dry.     Capillary Refill: Capillary refill takes less than 2 seconds.  Neurological:     General: No focal deficit present.     Mental Status: He is alert and oriented to person, place, and time.     Motor: No weakness.     Gait: Gait normal.  Psychiatric:        Mood and Affect: Mood normal.     Comments: Very talkative today     Labs reviewed: Basic Metabolic Panel: Recent Labs    02/05/18 0847 06/25/18 0824 10/29/18 0807  NA 140 137 138  K 4.7 4.5 4.9  CL 105 104 104  CO2 26 24 24   GLUCOSE 100* 112* 98  BUN 11 13 13   CREATININE 1.00 1.04 1.04  CALCIUM 9.5 10.1 9.6  TSH  --   --  2.30   Liver Function Tests: Recent Labs    02/05/18 0847 06/25/18 0824  AST 14 16  ALT 11 16  BILITOT 0.5 0.4  PROT 6.4 6.6   No results for input(s): LIPASE, AMYLASE in the last 8760 hours. No results for input(s): AMMONIA in the last 8760 hours. CBC: Recent Labs    02/05/18 0847 06/25/18 0824  WBC 7.7 7.7  NEUTROABS 4,697 4,543  HGB 14.5 14.6  HCT 42.9 42.6  MCV 85.5 84.5  PLT 289 308   Lipid Panel: Recent Labs    02/05/18 0847 06/25/18 0824 10/29/18 0807  CHOL 207* 212* 223*  HDL 44 47 46  LDLCALC 131* 140* 133*  TRIG 181* 130 285*  CHOLHDL 4.7 4.5 4.8   Lab Results  Component Value Date   HGBA1C 5.8 (H) 10/29/2018    Procedures since last visit: No results  found.  Assessment/Plan 1. Type 2 diabetes mellitus with other specified complication, without long-term current use of insulin (HCC) -remains well controlled, cont diet, needs to exercise more  2. Hyperlipidemia associated with type 2 diabetes mellitus ([place]) -not at goal, refuses statin therapy even weekly, again counseled on dietary changes and plans to improve this, risks of continued elevated cholesterol like strokes and heart attacks discussed  3. Renal  cell carcinoma, left ([place]) -with nephrectomy many years ago, doing fine here  4. Hypothyroidism, unspecified type -cont current levothyroxine, tsh at goal  5. Mild intermittent chronic asthma without complication -no signs of exacerbation, cont current regimen, not needing prn albuterol  Labs/tests ordered:   No orders of the defined types were placed in this encounter.  Next appt:  02/15/2019   Virgle Arth L. Onya Eutsler, D.O. [facility] [address], [place] [address] Cell Phone (Mon-Fri 8am-5pm):  [phone] On Call:  [phone] & follow prompts after 5pm & weekends Office Phone:  [phone] Office Fax:  [phone]

## 2018-11-20 ENCOUNTER — Other Ambulatory Visit: Payer: Self-pay | Admitting: *Deleted

## 2018-11-20 DIAGNOSIS — N400 Enlarged prostate without lower urinary tract symptoms: Secondary | ICD-10-CM

## 2018-11-20 DIAGNOSIS — E039 Hypothyroidism, unspecified: Secondary | ICD-10-CM

## 2018-11-20 DIAGNOSIS — E119 Type 2 diabetes mellitus without complications: Secondary | ICD-10-CM

## 2018-11-20 MED ORDER — FINASTERIDE 5 MG PO TABS: 5.0000 mg | ORAL_TABLET | Freq: Every day | ORAL | 2 refills | Status: DC

## 2018-11-20 MED ORDER — METFORMIN HCL 500 MG PO TABS: ORAL_TABLET | ORAL | 1 refills | Status: DC

## 2018-11-20 MED ORDER — LEVOTHYROXINE SODIUM 50 MCG PO TABS: 50.0000 ug | ORAL_TABLET | Freq: Every day | ORAL | 1 refills | Status: DC

## 2018-12-30 ENCOUNTER — Encounter: Payer: Self-pay | Admitting: Internal Medicine

## 2019-02-15 ENCOUNTER — Ambulatory Visit: Payer: Self-pay

## 2019-02-15 ENCOUNTER — Other Ambulatory Visit: Payer: Self-pay

## 2019-02-15 ENCOUNTER — Ambulatory Visit (INDEPENDENT_AMBULATORY_CARE_PROVIDER_SITE_OTHER): Payer: Medicare Other | Admitting: Family

## 2019-02-15 ENCOUNTER — Encounter: Payer: Self-pay | Admitting: Family

## 2019-02-15 DIAGNOSIS — Z0000 Encounter for general adult medical examination without abnormal findings: Secondary | ICD-10-CM | POA: Diagnosis not present

## 2019-02-15 NOTE — Progress Notes (Signed)
b   This service is provided via telemedicine  No vital signs collected/recorded due to the encounter was a telemedicine visit.   Location of patient (ex: home, work):  Home  Patient consents to a telephone visit:  Yes   Location of the provider (ex: office, home):  Office   Name of any referring provider:  Dr. Hollace Kinnier  Names of all persons participating in the telemedicine service and their role in the encounter:  Ruthell Rummage CMA, Dinah Ngetich NP, Dan Humphreys  Time spent on call:  Ruthell Rummage CMA  spent  7 Minutes on patient.

## 2019-02-15 NOTE — Patient Instructions (Signed)
[name] , Thank you for taking time to come for your Medicare Wellness Visit. I appreciate your ongoing commitment to your health goals. Please review the following plan we discussed and let me know if I can assist you in the future.   Screening recommendations/referrals: Colonoscopy: Up to date  Recommended yearly ophthalmology/optometry visit for glaucoma screening and checkup Recommended yearly dental visit for hygiene and checkup  Vaccinations: Influenza vaccine: Up to date Pneumococcal vaccine: Second dose of PNA vac due this year.   Tdap vaccine : Up to date due next 10/17/2027  Shingles vaccine: Had shingles in the past     Advanced directives: Yes   Conditions/risks identified: Advance Age male > 9 yrs,Type 2 DM,Hyperlipidemia,Hypertension,Obesity,male Gender    Next appointment: 1 year   Preventive Care 76 Years and Older, Male Preventive care refers to lifestyle choices and visits with your health care provider that can promote health and wellness. What does preventive care include?  A yearly physical exam. This is also called an annual well check.  Dental exams once or twice a year.  Routine eye exams. Ask your health care provider how often you should have your eyes checked.  Personal lifestyle choices, including:  Daily care of your teeth and gums.  Regular physical activity.  Eating a healthy diet.  Avoiding tobacco and drug use.  Limiting alcohol use.  Practicing safe sex.  Taking low doses of aspirin every day.  Taking vitamin and mineral supplements as recommended by your health care provider. What happens during an annual well check? The services and screenings done by your health care provider during your annual well check will depend on your age, overall health, lifestyle risk factors, and family history of disease. Counseling  Your health care provider may ask you questions about your:  Alcohol use.  Tobacco use.  Drug use.  Emotional  well-being.  Home and relationship well-being.  Sexual activity.  Eating habits.  History of falls.  Memory and ability to understand (cognition).  Work and work Statistician. Screening  You may have the following tests or measurements:  Height, weight, and BMI.  Blood pressure.  Lipid and cholesterol levels. These may be checked every 5 years, or more frequently if you are over 40 years old.  Skin check.  Lung cancer screening. You may have this screening every year starting at age 62 if you have a 30-pack-year history of smoking and currently smoke or have quit within the past 15 years.  Fecal occult blood test (FOBT) of the stool. You may have this test every year starting at age 82.  Flexible sigmoidoscopy or colonoscopy. You may have a sigmoidoscopy every 5 years or a colonoscopy every 10 years starting at age 62.  Prostate cancer screening. Recommendations will vary depending on your family history and other risks.  Hepatitis C blood test.  Hepatitis B blood test.  Sexually transmitted disease (STD) testing.  Diabetes screening. This is done by checking your blood sugar (glucose) after you have not eaten for a while (fasting). You may have this done every 1-3 years.  Abdominal aortic aneurysm (AAA) screening. You may need this if you are a current or former smoker.  Osteoporosis. You may be screened starting at age 32 if you are at high risk. Talk with your health care provider about your test results, treatment options, and if necessary, the need for more tests. Vaccines  Your health care provider may recommend certain vaccines, such as:  Influenza vaccine. This is  recommended every year.  Tetanus, diphtheria, and acellular pertussis (Tdap, Td) vaccine. You may need a Td booster every 10 years.  Zoster vaccine. You may need this after age 27.  Pneumococcal 13-valent conjugate (PCV13) vaccine. One dose is recommended after age 60.  Pneumococcal  polysaccharide (PPSV23) vaccine. One dose is recommended after age 51. Talk to your health care provider about which screenings and vaccines you need and how often you need them. This information is not intended to replace advice given to you by your health care provider. Make sure you discuss any questions you have with your health care provider. Document Released: 09/18/2015 Document Revised: 05/11/2016 Document Reviewed: 06/23/2015 Elsevier Interactive Patient Education  2017 [place] Prevention in the Home Falls can cause injuries. They can happen to people of all ages. There are many things you can do to make your home safe and to help prevent falls. What can I do on the outside of my home?  Regularly fix the edges of walkways and driveways and fix any cracks.  Remove anything that might make you trip as you walk through a door, such as a raised step or threshold.  Trim any bushes or trees on the path to your home.  Use bright outdoor lighting.  Clear any walking paths of anything that might make someone trip, such as rocks or tools.  Regularly check to see if handrails are loose or broken. Make sure that both sides of any steps have handrails.  Any raised decks and porches should have guardrails on the edges.  Have any leaves, snow, or ice cleared regularly.  Use sand or salt on walking paths during winter.  Clean up any spills in your garage right away. This includes oil or grease spills. What can I do in the bathroom?  Use night lights.  Install grab bars by the toilet and in the tub and shower. Do not use towel bars as grab bars.  Use non-skid mats or decals in the tub or shower.  If you need to sit down in the shower, use a plastic, non-slip stool.  Keep the floor dry. Clean up any water that spills on the floor as soon as it happens.  Remove soap buildup in the tub or shower regularly.  Attach bath mats securely with double-sided non-slip rug tape.   Do not have throw rugs and other things on the floor that can make you trip. What can I do in the bedroom?  Use night lights.  Make sure that you have a light by your bed that is easy to reach.  Do not use any sheets or blankets that are too big for your bed. They should not hang down onto the floor.  Have a firm chair that has side arms. You can use this for support while you get dressed.  Do not have throw rugs and other things on the floor that can make you trip. What can I do in the kitchen?  Clean up any spills right away.  Avoid walking on wet floors.  Keep items that you use a lot in easy-to-reach places.  If you need to reach something above you, use a strong step stool that has a grab bar.  Keep electrical cords out of the way.  Do not use floor polish or wax that makes floors slippery. If you must use wax, use non-skid floor wax.  Do not have throw rugs and other things on the floor that can make you trip.  What can I do with my stairs?  Do not leave any items on the stairs.  Make sure that there are handrails on both sides of the stairs and use them. Fix handrails that are broken or loose. Make sure that handrails are as long as the stairways.  Check any carpeting to make sure that it is firmly attached to the stairs. Fix any carpet that is loose or worn.  Avoid having throw rugs at the top or bottom of the stairs. If you do have throw rugs, attach them to the floor with carpet tape.  Make sure that you have a light switch at the top of the stairs and the bottom of the stairs. If you do not have them, ask someone to add them for you. What else can I do to help prevent falls?  Wear shoes that:  Do not have high heels.  Have rubber bottoms.  Are comfortable and fit you well.  Are closed at the toe. Do not wear sandals.  If you use a stepladder:  Make sure that it is fully opened. Do not climb a closed stepladder.  Make sure that both sides of the  stepladder are locked into place.  Ask someone to hold it for you, if possible.  Clearly mark and make sure that you can see:  Any grab bars or handrails.  First and last steps.  Where the edge of each step is.  Use tools that help you move around (mobility aids) if they are needed. These include:  Canes.  Walkers.  Scooters.  Crutches.  Turn on the lights when you go into a dark area. Replace any light bulbs as soon as they burn out.  Set up your furniture so you have a clear path. Avoid moving your furniture around.  If any of your floors are uneven, fix them.  If there are any pets around you, be aware of where they are.  Review your medicines with your doctor. Some medicines can make you feel dizzy. This can increase your chance of falling. Ask your doctor what other things that you can do to help prevent falls. This information is not intended to replace advice given to you by your health care provider. Make sure you discuss any questions you have with your health care provider. Document Released: 06/18/2009 Document Revised: 01/28/2016 Document Reviewed: 09/26/2014 Elsevier Interactive Patient Education  2017 [employer].

## 2019-02-15 NOTE — Progress Notes (Addendum)
Subjective:   ELBER GALYEAN is a 78 y.o. male who presents for Medicare Annual/Subsequent preventive examination.  Review of Systems:   Cardiac Risk Factors include: advanced age (>31men, >80 women);diabetes mellitus;dyslipidemia;hypertension;obesity (BMI >30kg/m2);male gender     Objective:    Vitals: There were no vitals taken for this visit.  There is no height or weight on file to calculate BMI.  Advanced Directives 02/15/2019 02/12/2018 10/16/2017 06/12/2017 01/06/2017 09/29/2016 08/30/2016  Does Patient Have a Medical Advance Directive? Yes Yes Yes Yes Yes Yes Yes  Type of Advance Directive - [place];Living will Healthcare Power of [place];Living will Healthcare Power of [place]  Does patient want to make changes to medical advance directive? No - Patient declined No - Patient declined - - No - Patient declined - -  Copy of [place] in Chart? - Yes Yes Yes Yes Yes Yes    Tobacco Social History   Tobacco Use  Smoking Status Never Smoker  Smokeless Tobacco Never Used     Counseling given: Not Answered   Clinical Intake:  Pre-visit preparation completed: No  Pain : No/denies pain     BMI - recorded: 34.38 Nutritional Status: BMI > 30  Obese Nutritional Risks: None Diabetes: Yes CBG done?: No Did pt. bring in CBG monitor from home?: Yes(recall CBG in the 120's) Glucose Meter Downloaded?: No  How often do you need to have someone help you when you read instructions, pamphlets, or other written materials from your doctor or pharmacy?: 1 - Never What is the last grade level you completed in school?: 12 Grades  Interpreter Needed?: No  Information entered by :: Aneya Daddona FNP-C  Past Medical History:  Diagnosis Date  . Back pain, lumbosacral 2014  . Cancer of kidney ([place]) 2012  . Cataract 2013  . Diabetes mellitus without  complication ([place]) 8416  . Hypertension 2012  . Nephrolithiasis 2012  . Obesity, unspecified   . Other and unspecified hyperlipidemia 04/03/2013  . Pain, joint, ankle and foot   . Unspecified hypothyroidism 2012   Past Surgical History:  Procedure Laterality Date  . CATARACT EXTRACTION W/ INTRAOCULAR LENS IMPLANT Bilateral 03/05/2016   Dr. [name]  . CYST REMOVAL TRUNK  11/03/2017  . KIDNEY SURGERY  12/2010   had cancer removed; Dr. Alinda Money  . ROTATOR CUFF REPAIR     bilateral; Dr. [name]   History reviewed. No pertinent family history. Social History   Socioeconomic History  . Marital status: Married    Spouse name: Not on file  . Number of children: Not on file  . Years of education: Not on file  . Highest education level: Not on file  Occupational History  . Occupation: Retired  Scientific laboratory technician  . Financial resource strain: Not hard at all  . Food insecurity    Worry: Never true    Inability: Never true  . Transportation needs    Medical: No    Non-medical: No  Tobacco Use  . Smoking status: Never Smoker  . Smokeless tobacco: Never Used  Substance and Sexual Activity  . Alcohol use: No  . Drug use: No  . Sexual activity: Yes  Lifestyle  . Physical activity    Days per week: 7 days    Minutes per session: 30 min  . Stress: Only a little  Relationships  . Social connections    Talks on phone: More than three  times a week    Gets together: More than three times a week    Attends religious service: More than 4 times per year    Active member of club or organization: No    Attends meetings of clubs or organizations: Never    Relationship status: Married  Other Topics Concern  . Not on file  Social History Narrative   Patient was previously married. His wife has died from cancer.   Remarried to Ronalee Belts   Never smoked   Alcohol none   Exercise 30 minutes daily   POA    Outpatient Encounter Medications as of 02/15/2019  Medication Sig  . aspirin 81 MG tablet  One daily to revent heart attack or stroke  . finasteride (PROSCAR) 5 MG tablet Take 1 tablet (5 mg total) by mouth daily. Take one tablet daily for prostate  . glucose blood (ONE TOUCH ULTRA TEST) test strip Use to test blood sugar once daily. Dx:E11.9  . levothyroxine (SYNTHROID, LEVOTHROID) 50 MCG tablet Take 1 tablet (50 mcg total) by mouth daily before breakfast.  . metFORMIN (GLUCOPHAGE) 500 MG tablet Take one tablet by mouth twice daily to control diabetes  . Multiple Vitamins-Minerals (MULTIVITAMIN WITH MINERALS) tablet Take 1 tablet by mouth daily.  Glory Rosebush DELICA LANCETS FINE MISC USE TO CHECK BLOOD SUGAR ONCE DAILY   No facility-administered encounter medications on file as of 02/15/2019.     Activities of Daily Living In your present state of health, do you have any difficulty performing the following activities: 02/15/2019  Hearing? Y  Vision? N  Difficulty concentrating or making decisions? N  Walking or climbing stairs? N  Dressing or bathing? N  Doing errands, shopping? N  Preparing Food and eating ? N  Using the Toilet? N  In the past six months, have you accidently leaked urine? N  Do you have problems with loss of bowel control? N  Managing your Medications? N  Managing your Finances? N  Housekeeping or managing your Housekeeping? N  Some recent data might be hidden    Patient Care Team: Gayland Curry, DO as PCP - General (Geriatric Medicine) [name], MD as Consulting Physician (Urology) [name], MD as Consulting Physician (Ophthalmology) [name], MD as Consulting Physician (Ophthalmology)   Assessment:   This is a routine wellness examination for Clifford.  Exercise Activities and Dietary recommendations Current Exercise Habits: Home exercise routine, Type of exercise: walking, Time (Minutes): 30, Frequency (Times/Week): 5, Weekly Exercise (Minutes/Week): 150, Intensity: Moderate, Exercise limited by: None identified  Goals    .  Weight (lb) < 175 lb (79.4 kg)     Starting 01/06/2017 I would like to continue working to my goal weight.       Fall Risk Fall Risk  02/15/2019 11/05/2018 07/17/2018 07/02/2018 02/12/2018  Falls in the past year? 0 0 0 No No  Number falls in past yr: 0 0 - - -  Injury with Fall? 0 0 - - -  Risk for fall due to : - History of fall(s);Impaired balance/gait;Impaired mobility;Medication side effect;Mental status change - - -  Follow up - Falls evaluation completed;Education provided;Falls prevention discussed - - -   Is the patient's home free of loose throw rugs in walkways, pet beds, electrical cords, etc?   no      Grab bars in the bathroom? no      Handrails on the stairs?   no      Adequate lighting?  yes   Depression Screen PHQ 2/9 Scores 02/15/2019 11/05/2018 07/02/2018 02/12/2018  PHQ - 2 Score 0 0 0 0    Cognitive Function MMSE - Mini Mental State Exam 02/12/2018 01/06/2017 04/05/2016 06/04/2014  Orientation to time 5 5 5 5   Orientation to Place 5 5 5 5   Registration 3 3 3 3   Attention/ Calculation 4 5 5 5   Recall 2 3 2 3   Language- name 2 objects 2 2 2 2   Language- repeat 1 1 1 1   Language- follow 3 step command 3 3 3 3   Language- read & follow direction 1 1 1 1   Write a sentence 1 1 1 1   Copy design 1 1 1 1   Total score 28 30 29 30      6CIT Screen 02/15/2019  What Year? 0 points  What month? 0 points  What time? 0 points  Count back from 20 0 points  Months in reverse 0 points  Repeat phrase 0 points  Total Score 0    Immunization History  Administered Date(s) Administered  . Influenza, High Dose Seasonal PF 06/20/2017, 06/02/2018  . Influenza,inj,Quad PF,6+ Mos 05/13/2015  . Influenza-Unspecified 06/05/2013, 06/20/2014, 05/30/2016  . Pneumococcal Conjugate-13 06/04/2014  . Pneumococcal Polysaccharide-23 09/05/1998  . Td 09/06/2003, 10/16/2017    Qualifies for Shingles Vaccine? Has had shingles.  Screening Tests Health Maintenance  Topic Date Due  . PNA vac  Low Risk Adult (2 of 2 - PPSV23) 06/05/2015  . OPHTHALMOLOGY EXAM  01/23/2019  . INFLUENZA VACCINE  04/06/2019  . HEMOGLOBIN A1C  04/29/2019  . FOOT EXAM  07/03/2019  . URINE MICROALBUMIN  07/03/2019  . TETANUS/TDAP  10/17/2027   Cancer Screenings: Lung: Low Dose CT Chest recommended if Age 48-80 years, 30 pack-year currently smoking OR have quit w/in 15years. Patient does not qualify. Colorectal: Had cologuard 2018    Additional Screenings: Hepatitis C Screening: Low Risk       Plan:   - Due for second dose of PNA Vac PPS 23,Annual eye and foot exam  Discussed with patient but patient would like to wait on this appointment until COVID-19 restrictions are over.   I have personally reviewed and noted the following in the patient's chart:   . Medical and social history . Use of alcohol, tobacco or illicit drugs  . Current medications and supplements . Functional ability and status . Nutritional status . Physical activity . Advanced directives . List of other physicians . Hospitalizations, surgeries, and ER visits in previous 12 months . Vitals . Screenings to include cognitive, depression, and falls . Referrals and appointments  In addition, I have reviewed and discussed with patient certain preventive protocols, quality metrics, and best practice recommendations. A written personalized care plan for preventive services as well as general preventive health recommendations were provided to patient.   Sandrea Hughs, NP  02/15/2019

## 2019-03-18 ENCOUNTER — Ambulatory Visit (INDEPENDENT_AMBULATORY_CARE_PROVIDER_SITE_OTHER): Payer: Medicare Other | Admitting: Internal Medicine

## 2019-03-18 ENCOUNTER — Other Ambulatory Visit: Payer: Self-pay

## 2019-03-18 ENCOUNTER — Encounter: Payer: Self-pay | Admitting: Internal Medicine

## 2019-03-18 DIAGNOSIS — J4520 Mild intermittent asthma, uncomplicated: Secondary | ICD-10-CM

## 2019-03-18 DIAGNOSIS — I10 Essential (primary) hypertension: Secondary | ICD-10-CM | POA: Diagnosis not present

## 2019-03-18 DIAGNOSIS — E6609 Other obesity due to excess calories: Secondary | ICD-10-CM

## 2019-03-18 DIAGNOSIS — E039 Hypothyroidism, unspecified: Secondary | ICD-10-CM | POA: Diagnosis not present

## 2019-03-18 DIAGNOSIS — E1169 Type 2 diabetes mellitus with other specified complication: Secondary | ICD-10-CM | POA: Diagnosis not present

## 2019-03-18 DIAGNOSIS — Z6831 Body mass index (BMI) 31.0-31.9, adult: Secondary | ICD-10-CM

## 2019-03-18 DIAGNOSIS — E785 Hyperlipidemia, unspecified: Secondary | ICD-10-CM

## 2019-03-18 MED ORDER — GLUCOSE BLOOD VI STRP: ORAL_STRIP | 1 refills | Status: DC

## 2019-03-18 NOTE — Patient Instructions (Signed)
You may call us mid-August to find out if we have flu shots available.  If so, you may come in for that and to get your lab work.  We can still plan to meet next (or do a phone visit) in November.

## 2019-03-18 NOTE — Progress Notes (Signed)
Patient ID: Nicholas Rivera, male   DOB: May 03, 1941, 78 y.o.   MRN: [id number] This service is provided via telemedicine  No vital signs collected/recorded due to the encounter was a telemedicine visit.   Location of patient (ex: home, work):  HOME   Patient consents to a telephone visit:  YES  Location of the provider (ex: office, home):  OFFICE  Name of any referring provider:  Yanky Vanderburg, DO  Names of all persons participating in the telemedicine service and their role in the encounter:  PATIENT, Edwin Dada, [place], [place], DO  Time spent on call:  4:43    Provider:  Kao Conry L. Mariea Clonts, D.O., C.M.D.  Goals of Care:  Advanced Directives 02/15/2019  Does Patient Have a Medical Advance Directive? Yes  Type of Advance Directive -  Does patient want to make changes to medical advance directive? No - Patient declined  Copy of [place] in Chart? -   Chief Complaint  Patient presents with  . Medical Management of Chronic Issues    31mth follow-up    HPI: Patient is a 78 y.o. male seen today for medical management of chronic diseases.    He has no new concerns.  He is trying to stay away from people.  He's trying to eat healthy.  He's at his camper now.  He sits outside at his campground and says socially distant.  The only time he goes out is to the grocery store once a month.  He goes to senior day at [employer].  Wears his mask and washes his hands well.  Keeps hand sanitizer and wipes in the car.    He's trying to do 30 mins or more of walking.  Tends to his yard, garden and reads outside.  He needs test strips.    Past Medical History:  Diagnosis Date  . Back pain, lumbosacral 2014  . Cancer of kidney ([place]) 2012  . Cataract 2013  . Diabetes mellitus without complication ([place]) 9518  . Hypertension 2012  . Nephrolithiasis 2012  . Obesity, unspecified   . Other and unspecified hyperlipidemia 04/03/2013  . Pain, joint, ankle and foot   . Unspecified  hypothyroidism 2012    Past Surgical History:  Procedure Laterality Date  . CATARACT EXTRACTION W/ INTRAOCULAR LENS IMPLANT Bilateral 03/05/2016   Dr. [name]  . CYST REMOVAL TRUNK  11/03/2017  . KIDNEY SURGERY  12/2010   had cancer removed; Dr. Alinda Money  . ROTATOR CUFF REPAIR     bilateral; Dr. [name]    No Known Allergies  Outpatient Encounter Medications as of 03/18/2019  Medication Sig  . Ascorbic Acid (VITAMIN C) 1000 MG tablet Take 1,000 mg by mouth daily.  Marland Kitchen aspirin 81 MG tablet One daily to revent heart attack or stroke  . Cholecalciferol (VITAMIN D-3) 125 MCG (5000 UT) TABS Take 1 tablet by mouth daily.  . finasteride (PROSCAR) 5 MG tablet Take 5 mg by mouth daily.  Marland Kitchen glucose blood (ONE TOUCH ULTRA TEST) test strip Use to test blood sugar once daily. Dx:E11.9  . levothyroxine (SYNTHROID, LEVOTHROID) 50 MCG tablet Take 1 tablet (50 mcg total) by mouth daily before breakfast.  . Magnesium 500 MG CAPS Take 1 capsule by mouth daily.  . metFORMIN (GLUCOPHAGE) 500 MG tablet Take one tablet by mouth twice daily to control diabetes  . Multiple Vitamins-Minerals (MULTIVITAMIN WITH MINERALS) tablet Take 1 tablet by mouth daily.  . Omega-3 Fatty Acids (OMEGA 3 500) 500 MG CAPS Take  1 capsule by mouth daily.  Glory Rosebush DELICA LANCETS FINE MISC USE TO CHECK BLOOD SUGAR ONCE DAILY  . [DISCONTINUED] finasteride (PROSCAR) 5 MG tablet Take 1 tablet (5 mg total) by mouth daily. Take one tablet daily for prostate (Patient taking differently: Take 5 mg by mouth daily. )   No facility-administered encounter medications on file as of 03/18/2019.     Review of Systems:  Review of Systems  Constitutional: Negative for chills, fever and malaise/fatigue.  HENT: Negative for hearing loss.   Eyes: Negative for blurred vision.       Does have some increased difficulty reading--uses glasses for that  Respiratory: Negative for cough and shortness of breath.   Cardiovascular: Negative for chest pain,  palpitations and leg swelling.  Gastrointestinal: Negative for abdominal pain, blood in stool, constipation and melena.       Eats a lot of fiber in cereal and flax   Genitourinary: Negative for dysuria.  Musculoskeletal: Negative for back pain, falls and joint pain.  Skin: Negative for rash.  Neurological: Negative for dizziness and loss of consciousness.  Endo/Heme/Allergies: Does not bruise/bleed easily.  Psychiatric/Behavioral: Negative for depression and memory loss. The patient is not nervous/anxious and does not have insomnia.     Health Maintenance  Topic Date Due  . PNA vac Low Risk Adult (2 of 2 - PPSV23) 06/05/2015  . OPHTHALMOLOGY EXAM  01/23/2019  . INFLUENZA VACCINE  04/06/2019  . HEMOGLOBIN A1C  04/29/2019  . FOOT EXAM  07/03/2019  . URINE MICROALBUMIN  07/03/2019  . TETANUS/TDAP  10/17/2027    Physical Exam: Could not be performed as visit non face-to-face via phone   Labs reviewed: Basic Metabolic Panel: Recent Labs    06/25/18 0824 10/29/18 0807  NA 137 138  K 4.5 4.9  CL 104 104  CO2 24 24  GLUCOSE 112* 98  BUN 13 13  CREATININE 1.04 1.04  CALCIUM 10.1 9.6  TSH  --  2.30   Liver Function Tests: Recent Labs    06/25/18 0824  AST 16  ALT 16  BILITOT 0.4  PROT 6.6   No results for input(s): LIPASE, AMYLASE in the last 8760 hours. No results for input(s): AMMONIA in the last 8760 hours. CBC: Recent Labs    06/25/18 0824  WBC 7.7  NEUTROABS 4,543  HGB 14.6  HCT 42.6  MCV 84.5  PLT 308   Lipid Panel: Recent Labs    06/25/18 0824 10/29/18 0807  CHOL 212* 223*  HDL 47 46  LDLCALC 140* 133*  TRIG 130 285*  CHOLHDL 4.5 4.8   Lab Results  Component Value Date   HGBA1C 5.8 (H) 10/29/2018    Procedures since last visit: No results found.  Assessment/Plan 1. Essential hypertension -bp was elevated last visit; need to see where it is when he returns here in the fall -cont same regimen with low sodium diet, walking   2. Mild  intermittent chronic asthma without complication -no wheezing or shortness of breath here lately, doing well  3. Hypothyroidism, unspecified type -continue levothyroxine before breakfast, clinically and chemically euthyroid Lab Results  Component Value Date   TSH 2.30 10/29/2018    4. Type 2 diabetes mellitus with other specified complication, without long-term current use of insulin (HCC) -cont metformin therapy, working on healthy low carb diet and walking regimen  5. Hyperlipidemia associated with type 2 diabetes mellitus ([place]) -continue fish oil, has long refused statins, cont baby asa  6. Class 1 obesity  due to excess calories without serious comorbidity with body mass index (BMI) of 31.0 to 31.9 in adult -sounds like he's doing better while staying at the [facility] rather than in [place]--exercising more and eating better  Labs/tests ordered:  Advised to come get the labs I ordered either just before the next visit or when he comes to get his flu shot later this summer   Next appt:  07/22/2019 med mgt, fasting labs before  Non face-to-face time spent on televisit:  27 minutes  Adayah Arocho L. Bijal Siglin, D.O. [facility] [address], [place] [address] Cell Phone (Mon-Fri 8am-5pm):  [phone] On Call:  [phone] & follow prompts after 5pm & weekends Office Phone:  [phone] Office Fax:  [phone]

## 2019-04-01 ENCOUNTER — Other Ambulatory Visit: Payer: Self-pay | Admitting: Internal Medicine

## 2019-04-01 DIAGNOSIS — E039 Hypothyroidism, unspecified: Secondary | ICD-10-CM

## 2019-04-01 DIAGNOSIS — E119 Type 2 diabetes mellitus without complications: Secondary | ICD-10-CM

## 2019-06-12 ENCOUNTER — Other Ambulatory Visit: Payer: Self-pay

## 2019-06-12 ENCOUNTER — Encounter: Payer: Self-pay | Admitting: Family

## 2019-06-12 ENCOUNTER — Ambulatory Visit (INDEPENDENT_AMBULATORY_CARE_PROVIDER_SITE_OTHER): Payer: Medicare Other | Admitting: Family

## 2019-06-12 ENCOUNTER — Other Ambulatory Visit: Payer: Self-pay | Admitting: Family

## 2019-06-12 VITALS — BP 160/90 | HR 70 | Temp 98.0°F | Resp 20 | Ht 66.0 in | Wt 215.8 lb

## 2019-06-12 DIAGNOSIS — I10 Essential (primary) hypertension: Secondary | ICD-10-CM

## 2019-06-12 MED ORDER — LOSARTAN POTASSIUM 25 MG PO TABS: 25.0000 mg | ORAL_TABLET | Freq: Every day | ORAL | 1 refills | Status: DC

## 2019-06-12 NOTE — Patient Instructions (Signed)
Check your blood pressure daily and record on log.Notify provider if BP > 140 /90 please bring B/p log to visit.

## 2019-06-12 NOTE — Progress Notes (Signed)
Provider: Kenyan Karnes FNP-C  Gayland Curry, DO  Patient Care Team: Gayland Curry, DO as PCP - General (Geriatric Medicine) [name], MD as Consulting Physician (Urology) [name], MD as Consulting Physician (Ophthalmology) [name], MD as Consulting Physician (Ophthalmology)  Extended Emergency Contact Information Primary Emergency Contact: Payton Doughty Address: [address], [place] [address] [place] Phone: [phone] Mobile Phone: [phone] Relation: Daughter Secondary Emergency Contact: Berneda Rose [place] Phone: [phone] Relation: Spouse  Code Status: Full Code  Goals of care: Advanced Directive information Advanced Directives 02/15/2019  Does Patient Have a Medical Advance Directive? Yes  Type of Advance Directive -  Does patient want to make changes to medical advance directive? No - Patient declined  Copy of [place] in Chart? -     Chief Complaint  Patient presents with  . Acute Visit    Elevated B/P for the last few days B/P was 182/96 before he came in  and he says he had past out earlier     HPI:  Pt is a 78 y.o. male seen today at [facility] office  for an acute visit for evaluation of high blood pressure for the past few days.He states B/P was 182/96 at home.He tells me he fell recently in his bathroom.He woke up around 3 AM to use the toilet that's when he fell in the bathroom.He thinks he must have passed out in the bathroom.he woke up on the floor.He felt weak so he went to bed. He sustained a bruise on his left arm and elbow. He denies hitting his head.He woke up lying on the left side.He states has been living separate with his wife due to COVID-19 so when he visited her,he forgot to take his metformin.He took the missed dose then later that evening took the evening dose of metformin.He thinks his blood sugars must have dropped. He has been checking his CBG since  then readings were 120 this morning and none fasting was 130. He states cooks for himself though eats canned vegetables.He exercises by walking 30 minutes daily.He walked for one hour yesterday.He denies any headache,dizziness,blurry vision,chest pain,palpitation or shortness of breath.  He has taken  Blood pressure medication in the past but was taken off by Dr.Green when he lost weight and blood pressure was within normal.He took Losartan.     Past Medical History:  Diagnosis Date  . Back pain, lumbosacral 2014  . Cancer of kidney ([place]) 2012  . Cataract 2013  . Diabetes mellitus without complication ([place]) 0000000  . Hypertension 2012  . Nephrolithiasis 2012  . Obesity, unspecified   . Other and unspecified hyperlipidemia 04/03/2013  . Pain, joint, ankle and foot   . Unspecified hypothyroidism 2012   Past Surgical History:  Procedure Laterality Date  . CATARACT EXTRACTION W/ INTRAOCULAR LENS IMPLANT Bilateral 03/05/2016   Dr. [name]  . CYST REMOVAL TRUNK  11/03/2017  . KIDNEY SURGERY  12/2010   had cancer removed; Dr. Alinda Money  . ROTATOR CUFF REPAIR     bilateral; Dr. [name]    No Known Allergies  Outpatient Encounter Medications as of 06/12/2019  Medication Sig  . Ascorbic Acid (VITAMIN C) 1000 MG tablet Take 1,000 mg by mouth daily.  Marland Kitchen aspirin 81 MG tablet One daily to revent heart attack or stroke  . Cholecalciferol (VITAMIN D-3) 125 MCG (5000 UT) TABS Take 1 tablet by mouth  daily.  . finasteride (PROSCAR) 5 MG tablet Take 5 mg by mouth daily.  Marland Kitchen glucose blood (ONE TOUCH ULTRA TEST) test strip Use to test blood sugar once daily. Dx:E11.9  . levothyroxine (SYNTHROID) 50 MCG tablet TAKE 1 TABLET BY MOUTH  DAILY BEFORE BREAKFAST  . Magnesium 500 MG CAPS Take 1 capsule by mouth daily.  . metFORMIN (GLUCOPHAGE) 500 MG tablet TAKE ONE TABLET BY MOUTH  TWICE DAILY TO CONTROL  DIABETES  . Multiple Vitamins-Minerals (MULTIVITAMIN WITH MINERALS) tablet Take 1 tablet by mouth daily.  .  Omega-3 Fatty Acids (OMEGA 3 500) 500 MG CAPS Take 1 capsule by mouth daily.  Glory Rosebush DELICA LANCETS FINE MISC USE TO CHECK BLOOD SUGAR ONCE DAILY   No facility-administered encounter medications on file as of 06/12/2019.     Review of Systems  Constitutional: Negative for appetite change, chills, fatigue and fever.  HENT: Negative for congestion, rhinorrhea, sinus pressure, sinus pain, sneezing and sore throat.   Eyes: Negative for discharge, redness and itching.  Respiratory: Negative for cough, chest tightness, shortness of breath and wheezing.   Cardiovascular: Negative for chest pain, palpitations and leg swelling.  Gastrointestinal: Negative for abdominal distention, abdominal pain, constipation, diarrhea, nausea and vomiting.  Endocrine: Negative for cold intolerance, heat intolerance, polydipsia, polyphagia and polyuria.  Genitourinary: Negative for difficulty urinating, dysuria, flank pain, frequency and urgency.  Musculoskeletal: Negative for arthralgias and gait problem.  Skin: Negative for color change, pallor and rash.  Neurological: Negative for dizziness, seizures, weakness, light-headedness, numbness and headaches.  Hematological: Does not bruise/bleed easily.  Psychiatric/Behavioral: Negative for agitation, confusion and sleep disturbance. The patient is not nervous/anxious.     Immunization History  Administered Date(s) Administered  . Influenza, High Dose Seasonal PF 06/20/2017, 06/02/2018, 04/30/2019  . Influenza,inj,Quad PF,6+ Mos 05/13/2015  . Influenza-Unspecified 06/05/2013, 06/20/2014, 05/30/2016  . Pneumococcal Conjugate-13 06/04/2014  . Pneumococcal Polysaccharide-23 09/05/1998  . Td 09/06/2003, 10/16/2017   Pertinent  Health Maintenance Due  Topic Date Due  . PNA vac Low Risk Adult (2 of 2 - PPSV23) 06/05/2015  . OPHTHALMOLOGY EXAM  01/23/2019  . HEMOGLOBIN A1C  04/29/2019  . URINE MICROALBUMIN  07/03/2019  . FOOT EXAM  07/03/2019  . INFLUENZA  VACCINE  Completed   Fall Risk  06/12/2019 03/18/2019 02/15/2019 11/05/2018 07/17/2018  Falls in the past year? 1 0 0 0 0  Number falls in past yr: 0 0 0 0 -  Injury with Fall? 1 0 0 0 -  Risk for fall due to : - - - History of fall(s);Impaired balance/gait;Impaired mobility;Medication side effect;Mental status change -  Follow up - - - Falls evaluation completed;Education provided;Falls prevention discussed -    Vitals:   06/12/19 1303  BP: (!) 160/90  Pulse: 70  Resp: 20  Temp: 98 F (36.7 C)  TempSrc: Oral  SpO2: 97%  Weight: 215 lb 12.8 oz (97.9 kg)  Height: 5\' 6"  (1.676 m)   Body mass index is 34.83 kg/m. Physical Exam Vitals signs reviewed.  Constitutional:      General: He is not in acute distress.    Appearance: He is obese. He is not ill-appearing.  HENT:     Nose: No congestion or rhinorrhea.     Mouth/Throat:     Mouth: Mucous membranes are moist.     Pharynx: No oropharyngeal exudate or posterior oropharyngeal erythema.  Eyes:     General: No scleral icterus.       Right eye: No discharge.  Left eye: No discharge.     Extraocular Movements: Extraocular movements intact.     Conjunctiva/sclera: Conjunctivae normal.     Pupils: Pupils are equal, round, and reactive to light.  Neck:     Musculoskeletal: Normal range of motion. No neck rigidity or muscular tenderness.     Vascular: No carotid bruit.  Cardiovascular:     Rate and Rhythm: Normal rate and regular rhythm.     Pulses: Normal pulses.     Heart sounds: Normal heart sounds. No murmur. No friction rub. No gallop.   Pulmonary:     Effort: Pulmonary effort is normal. No respiratory distress.     Breath sounds: Normal breath sounds. No wheezing, rhonchi or rales.  Chest:     Chest wall: No tenderness.  Abdominal:     General: Bowel sounds are normal. There is no distension.     Palpations: Abdomen is soft. There is no mass.     Tenderness: There is no abdominal tenderness. There is no right CVA  tenderness, left CVA tenderness, guarding or rebound.  Musculoskeletal: Normal range of motion.        General: No swelling or tenderness.     Left lower leg: No edema.  Lymphadenopathy:     Cervical: No cervical adenopathy.  Skin:    General: Skin is warm and dry.     Coloration: Skin is not pale.     Findings: Bruising present. No erythema or rash.     Comments: Left elbow x 3 abrasion area wound bed red, no drainage noted.above the elbow yellow bruise none tender to touch.Moves arm without any difficulties.   Neurological:     Mental Status: He is alert and oriented to person, place, and time.     Cranial Nerves: No cranial nerve deficit.     Sensory: No sensory deficit.     Motor: No weakness.     Coordination: Coordination normal.     Gait: Gait normal.  Psychiatric:        Mood and Affect: Mood normal.        Behavior: Behavior normal.        Thought Content: Thought content normal.        Judgment: Judgment normal.    Labs reviewed: Recent Labs    06/25/18 0824 10/29/18 0807  NA 137 138  K 4.5 4.9  CL 104 104  CO2 24 24  GLUCOSE 112* 98  BUN 13 13  CREATININE 1.04 1.04  CALCIUM 10.1 9.6   Recent Labs    06/25/18 0824  AST 16  ALT 16  BILITOT 0.4  PROT 6.6   Recent Labs    06/25/18 0824  WBC 7.7  NEUTROABS 4,543  HGB 14.6  HCT 42.6  MCV 84.5  PLT 308   Lab Results  Component Value Date   TSH 2.30 10/29/2018   Lab Results  Component Value Date   HGBA1C 5.8 (H) 10/29/2018   Lab Results  Component Value Date   CHOL 223 (H) 10/29/2018   HDL 46 10/29/2018   LDLCALC 133 (H) 10/29/2018   TRIG 285 (H) 10/29/2018   CHOLHDL 4.8 10/29/2018    Significant Diagnostic Results in last 30 days:  No results found.  Assessment/Plan   Essential hypertension His B/p elevated at home and at the office.Previous visit readings also high in the 140's.Asymptomatic. - EKG 12-Lead done shows normal sinus rhythm. - encouraged to continue with dietary  modification and exercise. - B/p log given  to record blood pressure and bring log to next visit with MD. - start on Losartan 25 mg tablet one by mouth once daily.  - Notify provider if B/p > 140/90  Family/ staff Communication: Reviewed plan of care with patient.  Labs/tests ordered: None has lab orders next month.   Sandrea Hughs, NP

## 2019-06-18 DIAGNOSIS — I10 Essential (primary) hypertension: Secondary | ICD-10-CM | POA: Diagnosis not present

## 2019-06-18 DIAGNOSIS — Z008 Encounter for other general examination: Secondary | ICD-10-CM | POA: Diagnosis not present

## 2019-06-18 DIAGNOSIS — E119 Type 2 diabetes mellitus without complications: Secondary | ICD-10-CM | POA: Diagnosis not present

## 2019-06-24 ENCOUNTER — Telehealth: Payer: Self-pay

## 2019-06-24 NOTE — Telephone Encounter (Signed)
Called patient and relayed the recommendations from Dr. Mariea Clonts.  He expressed concern if his machine was working properly.  He was asked to bring his machine in at his next office visit so that his BP could be checked with both to make sure his machine is working correctly and to observe his technique.  He stated he is being more conscious about his sodium intake, but it not waiting 30 minutes every time before he takes his BP.

## 2019-06-24 NOTE — Telephone Encounter (Signed)
His wife called and only wanted information from you.  Her husband saw Dinah 06/12/19 and was started on losartan 25 mg qd.  They are wanting to know how long it will take  to start working.  10/10 134/75  10/12 143/79  10/13 154/86  10/15 146/82  10/18 154/82  10/19 167/92  He stated these were all taken after he takes his medication, but may be taken almost immediately after taking.

## 2019-06-24 NOTE — Telephone Encounter (Signed)
He should be waiting at least 30 minutes after taking the medications before checking his blood pressure.  The medication should start working within a few days if he's following a low sodium diet, as well.

## 2019-07-15 ENCOUNTER — Other Ambulatory Visit: Payer: Medicare Other

## 2019-07-17 ENCOUNTER — Other Ambulatory Visit: Payer: Self-pay

## 2019-07-17 ENCOUNTER — Other Ambulatory Visit: Payer: Medicare Other

## 2019-07-17 DIAGNOSIS — E785 Hyperlipidemia, unspecified: Secondary | ICD-10-CM | POA: Diagnosis not present

## 2019-07-17 DIAGNOSIS — C642 Malignant neoplasm of left kidney, except renal pelvis: Secondary | ICD-10-CM

## 2019-07-17 DIAGNOSIS — E1169 Type 2 diabetes mellitus with other specified complication: Secondary | ICD-10-CM

## 2019-07-18 LAB — CBC WITH DIFFERENTIAL/PLATELET
Absolute Monocytes: 664 cells/uL (ref 200–950)
Basophils Absolute: 80 cells/uL (ref 0–200)
Basophils Relative: 1 %
Eosinophils Absolute: 448 cells/uL (ref 15–500)
Eosinophils Relative: 5.6 %
HCT: 41.2 % (ref 38.5–50.0)
Hemoglobin: 13.9 g/dL (ref 13.2–17.1)
Lymphs Abs: 2216 cells/uL (ref 850–3900)
MCH: 29.2 pg (ref 27.0–33.0)
MCHC: 33.7 g/dL (ref 32.0–36.0)
MCV: 86.6 fL (ref 80.0–100.0)
MPV: 10.1 fL (ref 7.5–12.5)
Monocytes Relative: 8.3 %
Neutro Abs: 4592 cells/uL (ref 1500–7800)
Neutrophils Relative %: 57.4 %
Platelets: 310 10*3/uL (ref 140–400)
RBC: 4.76 10*6/uL (ref 4.20–5.80)
RDW: 13.3 % (ref 11.0–15.0)
Total Lymphocyte: 27.7 %
WBC: 8 10*3/uL (ref 3.8–10.8)

## 2019-07-18 LAB — BASIC METABOLIC PANEL
BUN: 17 mg/dL (ref 7–25)
CO2: 22 mmol/L (ref 20–32)
Calcium: 9.5 mg/dL (ref 8.6–10.3)
Chloride: 105 mmol/L (ref 98–110)
Creat: 0.96 mg/dL (ref 0.70–1.18)
Glucose, Bld: 101 mg/dL — ABNORMAL HIGH (ref 65–99)
Potassium: 4.5 mmol/L (ref 3.5–5.3)
Sodium: 133 mmol/L — ABNORMAL LOW (ref 135–146)

## 2019-07-18 LAB — HEMOGLOBIN A1C
Hgb A1c MFr Bld: 5.8 % of total Hgb — ABNORMAL HIGH (ref <5.7)
Mean Plasma Glucose: 120 (calc)
eAG (mmol/L): 6.6 (calc)

## 2019-07-18 LAB — LIPID PANEL
Cholesterol: 221 mg/dL — ABNORMAL HIGH (ref <200)
HDL: 48 mg/dL (ref >=40)
LDL Cholesterol (Calc): 141 mg/dL (calc) — ABNORMAL HIGH
Non-HDL Cholesterol (Calc): 173 mg/dL (calc) — ABNORMAL HIGH (ref <130)
Total CHOL/HDL Ratio: 4.6 (calc) (ref <5.0)
Triglycerides: 186 mg/dL — ABNORMAL HIGH (ref <150)

## 2019-07-22 ENCOUNTER — Ambulatory Visit (INDEPENDENT_AMBULATORY_CARE_PROVIDER_SITE_OTHER): Payer: Medicare Other | Admitting: Internal Medicine

## 2019-07-22 ENCOUNTER — Encounter: Payer: Self-pay | Admitting: Internal Medicine

## 2019-07-22 ENCOUNTER — Other Ambulatory Visit: Payer: Self-pay

## 2019-07-22 VITALS — BP 146/90 | HR 70 | Temp 98.6°F | Resp 20 | Ht 65.0 in | Wt 218.8 lb

## 2019-07-22 DIAGNOSIS — E785 Hyperlipidemia, unspecified: Secondary | ICD-10-CM

## 2019-07-22 DIAGNOSIS — M545 Low back pain, unspecified: Secondary | ICD-10-CM

## 2019-07-22 DIAGNOSIS — E1169 Type 2 diabetes mellitus with other specified complication: Secondary | ICD-10-CM

## 2019-07-22 DIAGNOSIS — Z6831 Body mass index (BMI) 31.0-31.9, adult: Secondary | ICD-10-CM

## 2019-07-22 DIAGNOSIS — E6609 Other obesity due to excess calories: Secondary | ICD-10-CM

## 2019-07-22 DIAGNOSIS — E039 Hypothyroidism, unspecified: Secondary | ICD-10-CM

## 2019-07-22 DIAGNOSIS — I10 Essential (primary) hypertension: Secondary | ICD-10-CM | POA: Diagnosis not present

## 2019-07-22 DIAGNOSIS — G8929 Other chronic pain: Secondary | ICD-10-CM

## 2019-07-22 MED ORDER — LOSARTAN POTASSIUM 50 MG PO TABS: 50.0000 mg | ORAL_TABLET | Freq: Every day | ORAL | 3 refills | Status: DC

## 2019-07-22 NOTE — Progress Notes (Signed)
Location:  [facility] clinic  Provider: Dr. Hollace Rivera  Advanced Directives 07/22/2019  Does Patient Have a Medical Advance Directive? Yes  Type of Advance Directive -  Does patient want to make changes to medical advance directive? -  Copy of [place] in Chart? -     Chief Complaint  Patient presents with  . Medical Management of Chronic Issues    4 month follow up    HPI: Patient is a 78 y.o. male seen today for medical management of chronic diseases.    He states he has been doing well, but agitated he has had to wait an hour to be seen.   Labs reviewed with patient.   He continues to take his losartan daily. He also checks his blood pressure daily. Has brought a BP log and blood pressure machine with him today. He states he limits sodium from his diet when he can. Frustrated to find foods that are low in salt. Denies any episodes of dizziness, headache or blurred vision.   Claims the beet extract is helpful at lowering his blood pressure. He is taking it once or twice daily.   Takes his fish oil daily. He knows his LDL is still elevated. He admits to eating a lot of cheese and foods prepared with mayo. He stopped eating sausage daily. He is asking for diet recommendations.   He is still living in his camper.   No recent injuries or falls. Tying to stay active with chores. Claims he has been leaf blowing and raking for exercise.   Rechecked blood pressure with patient machine 151/86. Manual blood pressure 146/90.            Past Medical History:  Diagnosis Date  . Back pain, lumbosacral 2014  . Cancer of kidney ([place]) 2012  . Cataract 2013  . Diabetes mellitus without complication ([place]) 0000000  . Hypertension 2012  . Nephrolithiasis 2012  . Obesity, unspecified   . Other and unspecified hyperlipidemia 04/03/2013  . Pain, joint, ankle and foot   . Unspecified hypothyroidism 2012    Past Surgical History:  Procedure Laterality Date  .  CATARACT EXTRACTION W/ INTRAOCULAR LENS IMPLANT Bilateral 03/05/2016   Dr. Bing Rivera  . CYST REMOVAL TRUNK  11/03/2017  . KIDNEY SURGERY  12/2010   had cancer removed; Dr. Alinda Rivera  . ROTATOR CUFF REPAIR     bilateral; Dr. Veverly Rivera    No Known Allergies  Outpatient Encounter Medications as of 07/22/2019  Medication Sig  . Ascorbic Acid (VITAMIN C) 1000 MG tablet Take 1,000 mg by mouth daily.  Marland Kitchen aspirin 81 MG tablet One daily to revent heart attack or stroke  . Cholecalciferol (VITAMIN D-3) 125 MCG (5000 UT) TABS Take 1 tablet by mouth daily.  . finasteride (PROSCAR) 5 MG tablet Take 5 mg by mouth daily.  Marland Kitchen glucose blood (ONE TOUCH ULTRA TEST) test strip Use to test blood sugar once daily. Dx:E11.9  . levothyroxine (SYNTHROID) 50 MCG tablet TAKE 1 TABLET BY MOUTH  DAILY BEFORE BREAKFAST  . losartan (COZAAR) 25 MG tablet TAKE 1 TABLET(25 MG) BY MOUTH DAILY  . Magnesium 500 MG CAPS Take 1 capsule by mouth daily.  . metFORMIN (GLUCOPHAGE) 500 MG tablet TAKE ONE TABLET BY MOUTH  TWICE DAILY TO CONTROL  DIABETES  . Multiple Vitamins-Minerals (MULTIVITAMIN WITH MINERALS) tablet Take 1 tablet by mouth daily.  . Omega-3 Fatty Acids (OMEGA 3 500) 500 MG CAPS Take 1 capsule by mouth daily.  Marland Kitchen  ONETOUCH DELICA LANCETS FINE MISC USE TO CHECK BLOOD SUGAR ONCE DAILY   No facility-administered encounter medications on file as of 07/22/2019.     Review of Systems:  Review of Systems  Constitutional: Negative for activity change, appetite change and fever.  HENT: Negative for hearing loss and trouble swallowing.   Respiratory: Negative for cough, shortness of breath and wheezing.   Cardiovascular: Negative for chest pain and palpitations.  Gastrointestinal: Negative for abdominal pain and constipation.  Endocrine: Negative for polydipsia, polyphagia and polyuria.  Genitourinary: Negative for dysuria and hematuria.  Musculoskeletal: Negative.   Skin: Negative.   Neurological: Negative for dizziness,  syncope and headaches.  Psychiatric/Behavioral: Positive for agitation. Negative for dysphoric mood and sleep disturbance. The patient is not nervous/anxious.   All other systems reviewed and are negative.   Health Maintenance  Topic Date Due  . PNA vac Low Risk Adult (2 of 2 - PPSV23) 06/05/2015  . OPHTHALMOLOGY EXAM  01/23/2019  . FOOT EXAM  07/03/2019  . HEMOGLOBIN A1C  01/14/2020  . TETANUS/TDAP  10/17/2027  . INFLUENZA VACCINE  Completed    Physical Exam: Vitals:   07/22/19 0933  BP: (!) 150/90  Pulse: 70  Resp: 20  Temp: 98.6 F (37 C)  TempSrc: Oral  SpO2: 98%  Weight: 218 lb 12.8 oz (99.2 kg)  Height: 5\' 5"  (1.651 m)   Body mass index is 36.41 kg/m. Physical Exam Vitals signs reviewed.  Constitutional:      Appearance: Normal appearance. He is normal weight.  Cardiovascular:     Rate and Rhythm: Normal rate and regular rhythm.     Pulses: Normal pulses.     Heart sounds: Normal heart sounds. No murmur.  Pulmonary:     Effort: Pulmonary effort is normal. No respiratory distress.     Breath sounds: Normal breath sounds. No wheezing.  Abdominal:     General: Bowel sounds are normal. There is no distension.     Palpations: Abdomen is soft.     Tenderness: There is no abdominal tenderness.  Musculoskeletal: Normal range of motion.     Right lower leg: No edema.     Left lower leg: No edema.  Skin:    General: Skin is warm and dry.     Capillary Refill: Capillary refill takes less than 2 seconds.  Neurological:     General: No focal deficit present.     Mental Status: He is alert and oriented to person, place, and time. Mental status is at baseline.  Psychiatric:        Mood and Affect: Mood normal.        Behavior: Behavior normal.        Thought Content: Thought content normal.        Judgment: Judgment normal.     Labs reviewed: Basic Metabolic Panel: Recent Labs    10/29/18 0807 07/17/19 0820  NA 138 133*  K 4.9 4.5  CL 104 105  CO2 24 22   GLUCOSE 98 101*  BUN 13 17  CREATININE 1.04 0.96  CALCIUM 9.6 9.5  TSH 2.30  --    Liver Function Tests: No results for input(s): AST, ALT, ALKPHOS, BILITOT, PROT, ALBUMIN in the last 8760 hours. No results for input(s): LIPASE, AMYLASE in the last 8760 hours. No results for input(s): AMMONIA in the last 8760 hours. CBC: Recent Labs    07/17/19 0820  WBC 8.0  NEUTROABS 4,592  HGB 13.9  HCT 41.2  MCV 86.6  PLT 310   Lipid Panel: Recent Labs    10/29/18 0807 07/17/19 0820  CHOL 223* 221*  HDL 46 48  LDLCALC 133* 141*  TRIG 285* 186*  CHOLHDL 4.8 4.6   Lab Results  Component Value Date   HGBA1C 5.8 (H) 07/17/2019    Procedures since last visit: No results found.  Assessment/Plan 1. Type 2 diabetes mellitus with other specified complication, without long-term current use of insulin (HCC) - hemoglobin A1C unchanged from last time - stable at this time, no progression - continue Metformin 500 mg BID - continue diet that limits carbs and sugars  2. Hyperlipidemia associated with type 2 diabetes mellitus (HCC) - LDL not at goal of <100 - suspect his diet high in cheese and mayonaise- based food have contributed to high LDL - recommend mediterranean diet - avoid foods high in fat and fried food - recheck lipid panel- future  3. Hypothyroidism, unspecified type - stable, no signs of progression - continue current medication regmen - recheck TSH- future  4. Hyperlipidemia, unspecified hyperlipidemia type - same as above  5. Essential hypertension - blood pressure still elevated in AM - Increase losartan 50 mg daily - continue to check blood pressure daily and record - Dash diet - avoid processed foods and limit foods from cans - recommend light exercise 240 minutes/ week to promote weight loss - complete blood panel with differential/platelets, complete metabolic panel, hemoglobin A1C, lipid panel, TSH- future   Labs/tests ordered:  Complete blood count  with differential/platelets, complete metabolic panel, hemoglobin A1C, TSH, lipid panel- future Next appt:  4 months

## 2019-07-22 NOTE — Patient Instructions (Addendum)
Pinterest is a good place to look for recipes.  The mediterranean diet can meet most of your needs for low carb, low fat and low sodium.  DASH Eating Plan DASH stands for "Dietary Approaches to Stop Hypertension." The DASH eating plan is a healthy eating plan that has been shown to reduce high blood pressure (hypertension). It may also reduce your risk for type 2 diabetes, heart disease, and stroke. The DASH eating plan may also help with weight loss. What are tips for following this plan?  General guidelines  Avoid eating more than 2,300 mg (milligrams) of salt (sodium) a day. If you have hypertension, you may need to reduce your sodium intake to 1,500 mg a day.  Limit alcohol intake to no more than 1 drink a day for nonpregnant women and 2 drinks a day for men. One drink equals 12 oz of beer, 5 oz of wine, or 1 oz of hard liquor.  Work with your health care provider to maintain a healthy body weight or to lose weight. Ask what an ideal weight is for you.  Get at least 30 minutes of exercise that causes your heart to beat faster (aerobic exercise) most days of the week. Activities may include walking, swimming, or biking.  Work with your health care provider or diet and nutrition specialist (dietitian) to adjust your eating plan to your individual calorie needs. Reading food labels   Check food labels for the amount of sodium per serving. Choose foods with less than 5 percent of the Daily Value of sodium. Generally, foods with less than 300 mg of sodium per serving fit into this eating plan.  To find whole grains, look for the word "whole" as the first word in the ingredient list. Shopping  Buy products labeled as "low-sodium" or "no salt added."  Buy fresh foods. Avoid canned foods and premade or frozen meals. Cooking  Avoid adding salt when cooking. Use salt-free seasonings or herbs instead of table salt or sea salt. Check with your health care provider or pharmacist before using  salt substitutes.  Do not fry foods. Cook foods using healthy methods such as baking, boiling, grilling, and broiling instead.  Cook with heart-healthy oils, such as olive, canola, soybean, or sunflower oil. Meal planning  Eat a balanced diet that includes: ? 5 or more servings of fruits and vegetables each day. At each meal, try to fill half of your plate with fruits and vegetables. ? Up to 6-8 servings of whole grains each day. ? Less than 6 oz of lean meat, poultry, or fish each day. A 3-oz serving of meat is about the same size as a deck of cards. One egg equals 1 oz. ? 2 servings of low-fat dairy each day. ? A serving of nuts, seeds, or beans 5 times each week. ? Heart-healthy fats. Healthy fats called Omega-3 fatty acids are found in foods such as flaxseeds and coldwater fish, like sardines, salmon, and mackerel.  Limit how much you eat of the following: ? Canned or prepackaged foods. ? Food that is high in trans fat, such as fried foods. ? Food that is high in saturated fat, such as fatty meat. ? Sweets, desserts, sugary drinks, and other foods with added sugar. ? Full-fat dairy products.  Do not salt foods before eating.  Try to eat at least 2 vegetarian meals each week.  Eat more home-cooked food and less restaurant, buffet, and fast food.  When eating at a restaurant, ask that your food  be prepared with less salt or no salt, if possible. What foods are recommended? The items listed may not be a complete list. Talk with your dietitian about what dietary choices are best for you. Grains Whole-grain or whole-wheat bread. Whole-grain or whole-wheat pasta. Brown rice. [place]. Bulgur. Whole-grain and low-sodium cereals. Pita bread. Low-fat, low-sodium crackers. Whole-wheat flour tortillas. Vegetables Fresh or frozen vegetables (raw, steamed, roasted, or grilled). Low-sodium or reduced-sodium tomato and vegetable juice. Low-sodium or reduced-sodium tomato sauce and  tomato paste. Low-sodium or reduced-sodium canned vegetables. Fruits All fresh, dried, or frozen fruit. Canned fruit in natural juice (without added sugar). Meat and other protein foods Skinless chicken or [place]. Ground chicken or [place]. Pork with fat trimmed off. Fish and seafood. Egg whites. Dried beans, peas, or lentils. Unsalted nuts, nut butters, and seeds. Unsalted canned beans. Lean cuts of beef with fat trimmed off. Low-sodium, lean deli meat. Dairy Low-fat (1%) or fat-free (skim) milk. Fat-free, low-fat, or reduced-fat cheeses. Nonfat, low-sodium ricotta or cottage cheese. Low-fat or nonfat yogurt. Low-fat, low-sodium cheese. Fats and oils Soft margarine without trans fats. Vegetable oil. Low-fat, reduced-fat, or light mayonnaise and salad dressings (reduced-sodium). Canola, safflower, olive, soybean, and sunflower oils. Avocado. Seasoning and other foods Herbs. Spices. Seasoning mixes without salt. Unsalted popcorn and pretzels. Fat-free sweets. What foods are not recommended? The items listed may not be a complete list. Talk with your dietitian about what dietary choices are best for you. Grains Baked goods made with fat, such as croissants, muffins, or some breads. Dry pasta or rice meal packs. Vegetables Creamed or fried vegetables. Vegetables in a cheese sauce. Regular canned vegetables (not low-sodium or reduced-sodium). Regular canned tomato sauce and paste (not low-sodium or reduced-sodium). Regular tomato and vegetable juice (not low-sodium or reduced-sodium). Angie Fava. Olives. Fruits Canned fruit in a light or heavy syrup. Fried fruit. Fruit in cream or butter sauce. Meat and other protein foods Fatty cuts of meat. Ribs. Fried meat. Berniece Salines. Sausage. Bologna and other processed lunch meats. Salami. Fatback. Hotdogs. Bratwurst. Salted nuts and seeds. Canned beans with added salt. Canned or smoked fish. Whole eggs or egg yolks. Chicken or [place] with skin. Dairy Whole or 2%  milk, cream, and half-and-half. Whole or full-fat cream cheese. Whole-fat or sweetened yogurt. Full-fat cheese. Nondairy creamers. Whipped toppings. Processed cheese and cheese spreads. Fats and oils Butter. Stick margarine. Lard. Shortening. Ghee. Bacon fat. Tropical oils, such as coconut, palm kernel, or palm oil. Seasoning and other foods Salted popcorn and pretzels. Onion salt, garlic salt, seasoned salt, table salt, and sea salt. Worcestershire sauce. Tartar sauce. Barbecue sauce. Teriyaki sauce. Soy sauce, including reduced-sodium. Steak sauce. Canned and packaged gravies. Fish sauce. Oyster sauce. Cocktail sauce. Horseradish that you find on the shelf. Ketchup. Mustard. Meat flavorings and tenderizers. Bouillon cubes. Hot sauce and Tabasco sauce. Premade or packaged marinades. Premade or packaged taco seasonings. Relishes. Regular salad dressings. Where to find more information:  National Heart, Lung, and [place]: https://wilson-eaton.com/  American Heart Association: www.heart.org Summary  The DASH eating plan is a healthy eating plan that has been shown to reduce high blood pressure (hypertension). It may also reduce your risk for type 2 diabetes, heart disease, and stroke.  With the DASH eating plan, you should limit salt (sodium) intake to 2,300 mg a day. If you have hypertension, you may need to reduce your sodium intake to 1,500 mg a day.  When on the DASH eating plan, aim to eat more fresh fruits and vegetables, whole  grains, lean proteins, low-fat dairy, and heart-healthy fats.  Work with your health care provider or diet and nutrition specialist (dietitian) to adjust your eating plan to your individual calorie needs. This information is not intended to replace advice given to you by your health care provider. Make sure you discuss any questions you have with your health care provider. Document Released: 08/11/2011 Document Revised: 08/04/2017 Document Reviewed:  08/15/2016 Elsevier Patient Education  2020 [employer].     Why follow it? Research shows. . Those who follow the Mediterranean diet have a reduced risk of heart disease  . The diet is associated with a reduced incidence of Parkinson's and Alzheimer's diseases . People following the diet may have longer life expectancies and lower rates of chronic diseases  . The Dietary Guidelines for Americans recommends the Mediterranean diet as an eating plan to promote health and prevent disease  What Is the Mediterranean Diet?  . Healthy eating plan based on typical foods and recipes of Mediterranean-style cooking . The diet is primarily a plant based diet; these foods should make up a majority of meals   Starches - Plant based foods should make up a majority of meals - They are an important sources of vitamins, minerals, energy, antioxidants, and fiber - Choose whole grains, foods high in fiber and minimally processed items  - Typical grain sources include wheat, oats, barley, corn, brown rice, bulgar, farro, millet, polenta, couscous  - Various types of beans include chickpeas, lentils, fava beans, black beans, white beans   Fruits  Veggies - Large quantities of antioxidant rich fruits & veggies; 6 or more servings  - Vegetables can be eaten raw or lightly drizzled with oil and cooked  - Vegetables common to the traditional Mediterranean Diet include: artichokes, arugula, beets, broccoli, brussel sprouts, cabbage, carrots, celery, collard greens, cucumbers, eggplant, kale, leeks, lemons, lettuce, mushrooms, okra, onions, peas, peppers, potatoes, pumpkin, radishes, rutabaga, shallots, spinach, sweet potatoes, turnips, zucchini - Fruits common to the Mediterranean Diet include: apples, apricots, avocados, cherries, clementines, dates, figs, grapefruits, grapes, melons, nectarines, oranges, peaches, pears, pomegranates, strawberries, tangerines  Fats - Replace butter and margarine with healthy oils,  such as olive oil, canola oil, and tahini  - Limit nuts to no more than a handful a day  - Nuts include walnuts, almonds, pecans, pistachios, pine nuts  - Limit or avoid candied, honey roasted or heavily salted nuts - Olives are central to the [employer] - can be eaten whole or used in a variety of dishes   Meats Protein - Limiting red meat: no more than a few times a month - When eating red meat: choose lean cuts and keep the portion to the size of deck of cards - Eggs: approx. 0 to 4 times a week  - Fish and lean poultry: at least 2 a week  - Healthy protein sources include, chicken, [place], lean beef, lamb - Increase intake of seafood such as tuna, salmon, trout, mackerel, shrimp, scallops - Avoid or limit high fat processed meats such as sausage and bacon  Dairy - Include moderate amounts of low fat dairy products  - Focus on healthy dairy such as fat free yogurt, skim milk, low or reduced fat cheese - Limit dairy products higher in fat such as whole or 2% milk, cheese, ice cream  Alcohol - Moderate amounts of red wine is ok  - No more than 5 oz daily for women (all ages) and men older than age 11  -  No more than 10 oz of wine daily for men younger than 43  Other - Limit sweets and other desserts  - Use herbs and spices instead of salt to flavor foods  - Herbs and spices common to the traditional Mediterranean Diet include: basil, bay leaves, chives, cloves, cumin, fennel, garlic, lavender, marjoram, mint, oregano, parsley, pepper, rosemary, sage, savory, sumac, tarragon, thyme   It's not just a diet, it's a lifestyle:  . The Mediterranean diet includes lifestyle factors typical of those in the region  . Foods, drinks and meals are best eaten with others and savored . Daily physical activity is important for overall good health . This could be strenuous exercise like running and aerobics . This could also be more leisurely activities such as walking, housework, yard-work, or  taking the stairs . Moderation is the key; a balanced and healthy diet accommodates most foods and drinks . Consider portion sizes and frequency of consumption of certain foods   Meal Ideas & Options:  . Breakfast:  o Whole wheat toast or whole wheat English muffins with peanut butter & hard boiled egg o Steel cut oats topped with apples & cinnamon and skim milk  o Fresh fruit: banana, strawberries, melon, berries, peaches  o Smoothies: strawberries, bananas, greek yogurt, peanut butter o Low fat greek yogurt with blueberries and granola  o Egg white omelet with spinach and mushrooms o Breakfast couscous: whole wheat couscous, apricots, skim milk, cranberries  . Sandwiches:  o Hummus and grilled vegetables (peppers, zucchini, squash) on whole wheat bread   o Grilled chicken on whole wheat pita with lettuce, tomatoes, cucumbers or tzatziki  o Tuna salad on whole wheat bread: tuna salad made with greek yogurt, olives, red peppers, capers, green onions o Garlic rosemary lamb pita: lamb sauted with garlic, rosemary, salt & pepper; add lettuce, cucumber, greek yogurt to pita - flavor with lemon juice and black pepper  . Seafood:  o Mediterranean grilled salmon, seasoned with garlic, basil, parsley, lemon juice and black pepper o Shrimp, lemon, and spinach whole-grain pasta salad made with low fat greek yogurt  o Seared scallops with lemon orzo  o Seared tuna steaks seasoned salt, pepper, coriander topped with tomato mixture of olives, tomatoes, olive oil, minced garlic, parsley, green onions and cappers  . Meats:  o Herbed greek chicken salad with kalamata olives, cucumber, feta  o Red bell peppers stuffed with spinach, bulgur, lean ground beef (or lentils) & topped with feta   o Kebabs: skewers of chicken, tomatoes, onions, zucchini, squash  o [place] burgers: made with red onions, mint, dill, lemon juice, feta cheese topped with roasted red peppers . Vegetarian o Cucumber salad: cucumbers,  artichoke hearts, celery, red onion, feta cheese, tossed in olive oil & lemon juice  o Hummus and whole grain pita points with a greek salad (lettuce, tomato, feta, olives, cucumbers, red onion) o Lentil soup with celery, carrots made with vegetable broth, garlic, salt and pepper  o Tabouli salad: parsley, bulgur, mint, scallions, cucumbers, tomato, radishes, lemon juice, olive oil, salt and pepper.       Plan de alimentacin DASH DASH Eating Plan DASH es la sigla en ingls de "Enfoques Alimentarios para Detener la Hipertensin" (Dietary Approaches to Stop Hypertension). El plan de alimentacin DASH ha demostrado bajar la presin arterial elevada (hipertensin). Tambin puede reducir [employer] de diabetes tipo 2, enfermedad cardaca y accidente cerebrovascular. Este plan tambin puede ayudar a Horticulturist, commercial. Consejos para seguir este plan  Pautas generales  Evite ingerir ms de 2,300 mg (miligramos) de sal (sodio) por da. Si tiene hipertensin, es posible que necesite reducir la ingesta de sodio a 1,500 mg por da.  Limite el consumo de alcohol a no ms de 47medida por da si es mujer y no est [place], y 68medidas por da si es hombre. Una medida equivale a 12oz (366ml) de cerveza, 5oz (140ml) de vino o 1oz (4ml) de bebidas alcohlicas de alta graduacin.  Trabaje con su mdico para mantener un peso saludable o perder [employer]. Pregntele cul es el peso recomendado para usted.  Realice al menos 30 minutos de ejercicio que haga que se acelere su corazn (ejercicio Arboriculturist) la [employer] de la Circle. Estas actividades pueden incluir caminar, nadar o andar en bicicleta.  Trabaje con su mdico o especialista en alimentacin y nutricin (nutricionista) para ajustar su plan alimentario a sus necesidades calricas personales. Lectura de las etiquetas de los alimentos   Verifique en las etiquetas de los alimentos, la cantidad de sodio por porcin. Elija alimentos con menos del 5 por  ciento del valor diario de sodio. Generalmente, los alimentos con menos de 300 mg de sodio por porcin se encuadran dentro de este plan alimentario.  Para encontrar cereales integrales, busque la palabra "integral" como primera palabra en la lista de ingredientes. De compras  Compre productos en los que en su etiqueta diga: "bajo contenido de sodio" o "sin agregado de sal".  Compre alimentos frescos. Evite los alimentos enlatados y comidas precocidas o congeladas. Coccin  Evite agregar sal cuando cocine. Use hierbas o aderezos sin sal, en lugar de sal de mesa o sal marina. Consulte al mdico o farmacutico antes de usar sustitutos de la sal.  No fra los alimentos. A la hora de cocinar los alimentos opte por hornearlos, hervirlos, grillarlos y asarlos a Administrator, arts.  Cocine con aceites cardiosaludables, como oliva, canola, soja o girasol. Planificacin de las comidas  Consuma una dieta equilibrada, que incluya lo siguiente: ? 5o ms porciones de frutas y Set designer. Trate de que la mitad del plato de cada comida sean frutas y verduras. ? Hasta 6 u 8 porciones de cereales integrales por da. ? Menos de 6 onzas de carne, aves o pescado Games developer. Una porcin de 3 onzas de carne tiene casi el mismo tamao que un mazo de cartas. Un huevo equivale a 1 onza. ? Dos porciones de productos lcteos descremados por Training and development officer. ? Una porcin de frutos secos, semillas o frijoles 5 veces por semana. ? Grasas cardiosaludables. Las grasas saludables llamadas cidos grasos omega-3 se encuentran en alimentos como semillas de lino y pescados de agua fra, como por ejemplo, sardinas, salmn y caballa.  Limite la cantidad que ingiere de los siguientes alimentos: ? Alimentos enlatados o envasados. ? Alimentos con alto contenido de grasa trans, como alimentos fritos. ? Alimentos con alto contenido de grasa saturada, como carne con grasa. ? Dulces, postres, bebidas azucaradas y otros alimentos con azcar  agregada. ? Productos lcteos enteros.  No le agregue sal a los alimentos antes de probarlos.  Trate de comer al menos 2 comidas vegetarianas por semana.  Consuma ms comida casera y menos de restaurante, de bufs y comida rpida.  Cuando coma en un restaurante, pida que preparen su comida con menos sal o, en lo posible, sin nada de sal. Qu alimentos se recomiendan? Los alimentos enumerados a continuacin no constituyen Furniture conservator/restorer. Hable con el nutricionista sobre las mejores opciones alimenticias para  usted. Cereales Pan de salvado o integral. Pasta de salvado o integral. Arroz integral. Avena. Quinua. Trigo burgol. Cereales integrales y con bajo contenido de sodio. Pan pita. Galletitas de [place] con bajo contenido de [place] y [place]. Tortillas de [place] integral. Verduras Verduras frescas o congeladas (crudas, al vapor, asadas o grilladas). Jugos de tomate y verduras con bajo contenido de sodio o reducidos en sodio. Salsa y pasta de tomate con bajo contenido de sodio o reducidas en sodio. Verduras enlatadas con bajo contenido de sodio o reducidas en sodio. Frutas Todas las frutas frescas, congeladas o disecadas. Frutas enlatadas en jugo natural (sin agregado de azcar). Carne y otros alimentos proteicos Pollo o pavo sin piel. Carne de pollo o de [place]. Cerdo desgrasado. Pescado y [employer]. Claras de huevo. Porotos, guisantes o lentejas secos. Frutos secos, mantequilla de frutos secos y semillas sin sal. Frijoles enlatados sin sal. Cortes de carne vacuna magra, desgrasada. Embutidos magros, con bajo contenido de [place]. Lcteos Leche descremada (1%) o descremada. Quesos sin grasa, con bajo contenido de grasa o descremados. Queso blanco o ricota sin grasa, con bajo contenido de [place]. Yogur semidescremado o descremado. Queso con bajo contenido de [place] y [place]. Grasas y [employer] untables que no contengan grasas trans. Aceite vegetal. Lubertha Basque y aderezos para ensaladas  livianos o con bajo contenido de grasas (reducidos en sodio). Aceite de canola, crtamo, oliva, soja y [place]. [place]. Condimentos y otros alimentos Hierbas. Especias. Mezclas de condimentos sin sal. Palomitas de maz y pretzels sin sal. Dulces con bajo contenido de grasas. Qu alimentos no se recomiendan? Los alimentos enumerados a continuacin no constituyen Furniture conservator/restorer. Hable con el nutricionista sobre las mejores opciones alimenticias para usted. Cereales Productos de panificacin hechos con grasa, como medialunas, magdalenas y algunos panes. Comidas con arroz o pasta seca listas para usar. Verduras Verduras con crema o fritas. Verduras en [place]. Verduras enlatadas regulares (que no sean con bajo contenido de sodio o reducidas en sodio). Pasta y salsa de tomates enlatadas regulares (que no sean con bajo contenido de sodio o reducidas en sodio). Jugos de tomate y verduras regulares (que no sean con bajo contenido de sodio o reducidos en sodio). Pepinillos. Aceitunas. Lambert Mody Fruta enlatada en almbar liviano o espeso. Frutas cocidas en aceite. Frutas con salsa de crema o [place]. Carne y otros alimentos proteicos Cortes de carne con grasa. Costillas. Carne frita. [place]. Salchichas. [place] y otras carnes procesadas. Salame. Panceta. Perros calientes (hotdogs). [place]. Frutos secos y semillas con sal. Frijoles enlatados con agregado de sal. Pescado enlatado o ahumado. Huevos enteros o yemas. Pollo o pavo con piel. Lcteos Leche entera o al 2%, crema y mitad leche y mitad crema. Queso crema entero o con toda su grasa. Yogur entero o endulzado. Quesos con toda su grasa. Sustitutos de cremas no lcteas. Coberturas batidas. Quesos para untar y quesos procesados. Grasas y [employer]. Margarina en barra. [place]. Materia grasa. Mantequilla clarificada. Grasa de panceta. Aceites tropicales como aceite de coco, palmiste o palma. Condimentos y otros  alimentos Palomitas de maz y pretzels con sal. Sal de cebolla, sal de ajo, sal condimentada, sal de mesa y sal marina. Salsa Worcestershire. Salsa trtara. Salsa barbacoa. Salsa teriyaki. Salsa de soja, incluso la que tiene contenido reducido de [place]. Salsa de carne. Salsas en lata y envasadas. Salsa de pescado. Salsa de [place]. Salsa rosada. Rbano picante envasado. [place]. [place]. Saborizantes y tiernizantes para carne. Caldo en cubitos. Salsa picante y salsa tabasco. Surveyor, quantity  envasados o ya preparados. Aderezos para tacos prefabricados o envasados. Salsas. Aderezos comunes para ensalada. Dnde encontrar ms informacin:  [place], los Pulmones y Herbalist (National Heart, Lung, and [place]): https://wilson-eaton.com/  Asociacin [place]adounidense del [place]n (American Heart Association): www.heart.org Resumen  El plan de alimentacin DASH ha demostrado bajar la presin arterial elevada (hipertensin). Tambin puede reducir [employer] de diabetes tipo 2, enfermedad cardaca y accidente cerebrovascular.  Con el plan de alimentacin DASH, deber limitar el consumo de sal (sodio) a 2,300 mg por da. Si tiene hipertensin, es posible que necesite reducir la ingesta de sodio a 1,500 mg por da.  Cuando siga el plan de alimentacin DASH, trate de comer ms frutas frescas y verduras, cereales integrales, carnes magras, lcteos descremados y grasas cardiosaludables.  Trabaje con su mdico o especialista en alimentacin y nutricin (nutricionista) para ajustar su plan alimentario a sus necesidades calricas personales. Esta informacin no tiene Marine scientist el consejo del mdico. Asegrese de hacerle al mdico cualquier pregunta que tenga. Document Released: 08/11/2011 Document Revised: 12/12/2016 Document Reviewed: 12/12/2016 Elsevier Patient Education  2020 [employer].

## 2019-08-21 ENCOUNTER — Other Ambulatory Visit: Payer: Self-pay | Admitting: Internal Medicine

## 2019-08-23 ENCOUNTER — Encounter: Payer: Self-pay | Admitting: Internal Medicine

## 2019-08-23 ENCOUNTER — Ambulatory Visit (INDEPENDENT_AMBULATORY_CARE_PROVIDER_SITE_OTHER): Payer: Medicare Other | Admitting: Internal Medicine

## 2019-08-23 ENCOUNTER — Other Ambulatory Visit: Payer: Self-pay

## 2019-08-23 VITALS — BP 136/71

## 2019-08-23 DIAGNOSIS — L240 Irritant contact dermatitis due to detergents: Secondary | ICD-10-CM | POA: Diagnosis not present

## 2019-08-23 DIAGNOSIS — I10 Essential (primary) hypertension: Secondary | ICD-10-CM

## 2019-08-23 MED ORDER — TRIAMCINOLONE ACETONIDE 0.5 % EX CREA: 1.0000 "application " | TOPICAL_CREAM | Freq: Three times a day (TID) | CUTANEOUS | 3 refills | Status: DC

## 2019-08-23 NOTE — Progress Notes (Signed)
Patient ID: Nicholas Rivera, male   DOB: 01/13/1941, 78 y.o.   MRN: [id number] This service is provided via telemedicine  No vital signs collected/recorded due to the encounter was a telemedicine visit.   Location of patient (ex: home, work):  HOME  Patient consents to a telephone visit:  YES   Location of the provider (ex: office, home):  OFFICE  Name of any referring provider:  DR [facility] Jamiah Homeyer, DO  Names of all persons participating in the telemedicine service and their role in the encounter:  PATIENT, [name], [name], [name], DO  Time spent on call:  5 mins    Provider: Enrrique Mierzwa L. Mariea Clonts, D.O., C.M.D.  Goals of Care:  Advanced Directives 07/22/2019  Does Patient Have a Medical Advance Directive? Yes  Type of Advance Directive -  Does patient want to make changes to medical advance directive? -  Copy of [place] in Chart? -     Chief Complaint  Patient presents with  . Acute Visit    Rash X1 week , telephone visit    HPI: Patient is a 78 y.o. male spoke with by phone today for an acute visit for a rash that he's got.  He's using a cream that Dr. Nyoka Cowden prescribed long ago.  First he tried alcohol and antibiotic ointment.  Triamcinolone cream 0.01% is what he's using.  The itching is better.  He used it last night and twice today so far.  Rash is in his inner thighs.  Started on the right inner thigh, then to the left inner thigh.  No other places.  It was itchy until he put the cream on.  No pain.  The itch is a nuisance, but not severe.  It's red and the skin feels like the skin is dry or chapped with little red bumps.  No drainage.  He had changed his laundry detergent back to tide b/c he was watching his son's dogs.  It's been a while since he's used tide. His handsoap is also different.  He's washed almost all of the clothes again since, but has not washed sheets yet--he's doing that today.    He also has another betamethasone 0.05% gel.    BP:   We had increased his losartan.  It still varies.  1230pm 136/73.  Can be 142/76, 151/64.  [place] nurse came out on 12/8 and bp then was 130/76.  Says it feels too tight at times and that's when his bp is higher.  bp better when it doesn't feel as tight.  It's only been in the 150s twice in the past month.  Majority in 130s.    Past Medical History:  Diagnosis Date  . Back pain, lumbosacral 2014  . Cancer of kidney ([place]) 2012  . Cataract 2013  . Diabetes mellitus without complication ([place]) 0000000  . Hypertension 2012  . Nephrolithiasis 2012  . Obesity, unspecified   . Other and unspecified hyperlipidemia 04/03/2013  . Pain, joint, ankle and foot   . Unspecified hypothyroidism 2012    Past Surgical History:  Procedure Laterality Date  . CATARACT EXTRACTION W/ INTRAOCULAR LENS IMPLANT Bilateral 03/05/2016   Dr. [name]  . CYST REMOVAL TRUNK  11/03/2017  . KIDNEY SURGERY  12/2010   had cancer removed; Dr. Alinda Money  . ROTATOR CUFF REPAIR     bilateral; Dr. [name]    No Known Allergies  Outpatient Encounter Medications as of 08/23/2019  Medication Sig  . Ascorbic Acid (VITAMIN C)  1000 MG tablet Take 1,000 mg by mouth daily.  Marland Kitchen aspirin 81 MG tablet One daily to revent heart attack or stroke  . Cholecalciferol (VITAMIN D-3) 125 MCG (5000 UT) TABS Take 1 tablet by mouth daily.  . finasteride (PROSCAR) 5 MG tablet Take 5 mg by mouth daily.  Marland Kitchen levothyroxine (SYNTHROID) 50 MCG tablet TAKE 1 TABLET BY MOUTH  DAILY BEFORE BREAKFAST  . losartan (COZAAR) 50 MG tablet Take 1 tablet (50 mg total) by mouth daily.  . Magnesium 500 MG CAPS Take 1 capsule by mouth daily.  . metFORMIN (GLUCOPHAGE) 500 MG tablet TAKE ONE TABLET BY MOUTH  TWICE DAILY TO CONTROL  DIABETES  . Multiple Vitamins-Minerals (MULTIVITAMIN WITH MINERALS) tablet Take 1 tablet by mouth daily.  . Omega-3 Fatty Acids (OMEGA 3 500) 500 MG CAPS Take 1 capsule by mouth daily.  Glory Rosebush DELICA LANCETS FINE MISC USE TO CHECK BLOOD SUGAR ONCE  DAILY  . ONETOUCH ULTRA test strip USE TO TEST BLOOD SUGAR  ONCE DAILY  . triamcinolone cream (KENALOG) 0.5 % Apply 1 application topically 3 (three) times daily.   No facility-administered encounter medications on file as of 08/23/2019.    Review of Systems:  Review of Systems  Constitutional: Negative for chills, fever and malaise/fatigue.  HENT: Negative for congestion and sore throat.   Eyes: Negative for blurred vision.  Respiratory: Negative for cough and shortness of breath.   Cardiovascular: Negative for chest pain and palpitations.  Gastrointestinal: Negative for abdominal pain.  Genitourinary: Negative for dysuria.  Musculoskeletal: Negative for falls and joint pain.  Skin: Positive for itching and rash.  Neurological: Negative for dizziness and loss of consciousness.  Endo/Heme/Allergies: Does not bruise/bleed easily.  Psychiatric/Behavioral: Negative for depression and memory loss. The patient is not nervous/anxious and does not have insomnia.     Health Maintenance  Topic Date Due  . PNA vac Low Risk Adult (2 of 2 - PPSV23) 06/05/2015  . OPHTHALMOLOGY EXAM  01/23/2019  . FOOT EXAM  07/03/2019  . HEMOGLOBIN A1C  01/14/2020  . TETANUS/TDAP  10/17/2027  . INFLUENZA VACCINE  Completed    Physical Exam: This portion of visit could not be done due to non face-to-face visit (telehealth)  Labs reviewed: Basic Metabolic Panel: Recent Labs    10/29/18 0807 07/17/19 0820  NA 138 133*  K 4.9 4.5  CL 104 105  CO2 24 22  GLUCOSE 98 101*  BUN 13 17  CREATININE 1.04 0.96  CALCIUM 9.6 9.5  TSH 2.30  --    Liver Function Tests: No results for input(s): AST, ALT, ALKPHOS, BILITOT, PROT, ALBUMIN in the last 8760 hours. No results for input(s): LIPASE, AMYLASE in the last 8760 hours. No results for input(s): AMMONIA in the last 8760 hours. CBC: Recent Labs    07/17/19 0820  WBC 8.0  NEUTROABS 4,592  HGB 13.9  HCT 41.2  MCV 86.6  PLT 310   Lipid  Panel: Recent Labs    10/29/18 0807 07/17/19 0820  CHOL 223* 221*  HDL 46 48  LDLCALC 133* 141*  TRIG 285* 186*  CHOLHDL 4.8 4.6   Lab Results  Component Value Date   HGBA1C 5.8 (H) 07/17/2019    Assessment/Plan 1. Contact dermatitis and eczema due to detergents - has returned--he's had this rash in the past but it's been a while -he's been using an old tube of kenalog that's expired - will send in fresh tube and he'll use thru the weekend--if rash not  clearing up by Monday, call back and I'll prescribe an antifungal instead -but, it sounds like this may be from his short-term change in detergent - triamcinolone cream (KENALOG) 0.5 %; Apply 1 application topically 3 (three) times daily.  Dispense: 30 g; Refill: 3  2.  Essential hypertension -bp improved with losartan increase -will cont same and keep monitoring a few times a week and writing down so we can review for longer time at his next routine visit -lowest was [id number] systolic once, but typically now in 130s  Labs/tests ordered:  No new added today Next appt:  02/17/2020 Non face-to-face time spent on televisit:  25 mins  Claudell Wohler L. Britton Perkinson, D.O. [facility] [address],  [address] Cell Phone (Mon-Fri 8am-5pm):  [phone] On Call:  [phone] & follow prompts after 5pm & weekends Office Phone:  [phone] Office Fax:  [phone]

## 2019-09-02 ENCOUNTER — Encounter: Payer: Self-pay | Admitting: Internal Medicine

## 2019-09-05 ENCOUNTER — Telehealth: Payer: Self-pay | Admitting: *Deleted

## 2019-09-05 ENCOUNTER — Other Ambulatory Visit: Payer: Self-pay | Admitting: Internal Medicine

## 2019-09-05 MED ORDER — CLOTRIMAZOLE-BETAMETHASONE 1-0.05 % EX CREA: 1.0000 "application " | TOPICAL_CREAM | Freq: Two times a day (BID) | CUTANEOUS | 0 refills | Status: AC

## 2019-09-05 NOTE — Telephone Encounter (Signed)
Patient notified and agreed.  

## 2019-09-05 NOTE — Telephone Encounter (Signed)
Patient called and stated that he spoke with Dr. Mariea Clonts last week regarding a rash he has. Stated that it is no better and stated Dr. Mariea Clonts told him to call back if no better and something else would be called into pharmacy. Patient stated that rash is no better and he wants something else. Please Advise. (forwarded message to Dr. Lyndel Safe due to Dr. Mariea Clonts out of office)  Reviewed Dr. Cyndi Lennert note: Assessment/Plan 1. Contact dermatitis and eczema due to detergents - has returned--he's had this rash in the past but it's been a while -he's been using an old tube of kenalog that's expired - will send in fresh tube and he'll use thru the weekend--if rash not clearing up by Monday, call back and I'll prescribe an antifungal instead -but, it sounds like this Geza Beranek be from his short-term change in detergent - triamcinolone cream (KENALOG) 0.5 %; Apply 1 application topically 3 (three) times daily.  Dispense: 30 g; Refill: 3

## 2019-09-05 NOTE — Telephone Encounter (Signed)
Let him know I have called in Combination of Antifungal and Steroid Cream to be applied BID for 2 weeks. If Not getting better in 1 week he needs to come and see Dr Mariea Clonts

## 2019-09-22 ENCOUNTER — Ambulatory Visit: Payer: Medicare Other | Attending: Internal Medicine

## 2019-09-22 DIAGNOSIS — Z23 Encounter for immunization: Secondary | ICD-10-CM

## 2019-09-22 NOTE — Progress Notes (Signed)
   Covid-19 Vaccination Clinic  Name:  Nicholas Rivera    MRN: [id number] DOB: 22-Aug-1941  09/22/2019  Nicholas Rivera was observed post Covid-19 immunization for 15 minutes without incidence. He was provided with Vaccine Information Sheet and instruction to access the V-Safe system.   Nicholas Rivera was instructed to call 911 with any severe reactions post vaccine: Marland Kitchen Difficulty breathing  . Swelling of your face and throat  . A fast heartbeat  . A bad rash all over your body  . Dizziness and weakness

## 2019-10-13 ENCOUNTER — Ambulatory Visit: Payer: Medicare Other | Attending: Internal Medicine

## 2019-10-13 DIAGNOSIS — Z23 Encounter for immunization: Secondary | ICD-10-CM | POA: Insufficient documentation

## 2019-10-13 NOTE — Progress Notes (Signed)
   Covid-19 Vaccination Clinic  Name:  Nicholas Rivera    MRN: [id number] DOB: 06-03-1941  10/13/2019  Nicholas Rivera was observed post Covid-19 immunization for 15 minutes without incidence. He was provided with Vaccine Information Sheet and instruction to access the V-Safe system.   Nicholas Rivera was instructed to call 911 with any severe reactions post vaccine: Marland Kitchen Difficulty breathing  . Swelling of your face and throat  . A fast heartbeat  . A bad rash all over your body  . Dizziness and weakness    Immunizations Administered    Name Date Dose VIS Date Route   Pfizer COVID-19 Vaccine 10/13/2019 10:51 AM 0.3 mL 08/16/2019 Intramuscular   Manufacturer: [place]   Lot: [id number]   [place]: [id number]

## 2019-11-25 ENCOUNTER — Other Ambulatory Visit: Payer: Self-pay

## 2019-11-25 ENCOUNTER — Other Ambulatory Visit: Payer: Medicare Other

## 2019-11-25 DIAGNOSIS — I10 Essential (primary) hypertension: Secondary | ICD-10-CM | POA: Diagnosis not present

## 2019-11-25 DIAGNOSIS — E785 Hyperlipidemia, unspecified: Secondary | ICD-10-CM

## 2019-11-25 DIAGNOSIS — E1169 Type 2 diabetes mellitus with other specified complication: Secondary | ICD-10-CM

## 2019-11-25 DIAGNOSIS — E6609 Other obesity due to excess calories: Secondary | ICD-10-CM

## 2019-11-25 DIAGNOSIS — Z6831 Body mass index (BMI) 31.0-31.9, adult: Secondary | ICD-10-CM

## 2019-11-25 NOTE — Progress Notes (Signed)
Cholesterol has trended up more since last visit.  Bad cholesterol is about twice what is recommended and triglycerides (starchy fats) are also elevated.   Electrolytes and kidneys are fine. We will review at his appt

## 2019-11-26 LAB — LIPID PANEL
Cholesterol: 230 mg/dL — ABNORMAL HIGH (ref <200)
HDL: 49 mg/dL (ref >=40)
LDL Cholesterol (Calc): 146 mg/dL (calc) — ABNORMAL HIGH
Non-HDL Cholesterol (Calc): 181 mg/dL (calc) — ABNORMAL HIGH (ref <130)
Total CHOL/HDL Ratio: 4.7 (calc) (ref <5.0)
Triglycerides: 209 mg/dL — ABNORMAL HIGH (ref <150)

## 2019-11-26 LAB — BASIC METABOLIC PANEL
BUN: 19 mg/dL (ref 7–25)
CO2: 24 mmol/L (ref 20–32)
Calcium: 8.9 mg/dL (ref 8.6–10.3)
Chloride: 107 mmol/L (ref 98–110)
Creat: 1.01 mg/dL (ref 0.70–1.18)
Glucose, Bld: 98 mg/dL (ref 65–99)
Potassium: 4.2 mmol/L (ref 3.5–5.3)
Sodium: 139 mmol/L (ref 135–146)

## 2019-11-26 LAB — HEMOGLOBIN A1C
Hgb A1c MFr Bld: 6 % of total Hgb — ABNORMAL HIGH (ref <5.7)
Mean Plasma Glucose: 126 (calc)
eAG (mmol/L): 7 (calc)

## 2019-12-02 ENCOUNTER — Other Ambulatory Visit: Payer: Self-pay

## 2019-12-02 ENCOUNTER — Encounter: Payer: Self-pay | Admitting: Internal Medicine

## 2019-12-02 ENCOUNTER — Ambulatory Visit (INDEPENDENT_AMBULATORY_CARE_PROVIDER_SITE_OTHER): Payer: Medicare Other | Admitting: Internal Medicine

## 2019-12-02 VITALS — BP 128/92 | HR 67 | Temp 97.5°F | Wt 223.6 lb

## 2019-12-02 DIAGNOSIS — E1169 Type 2 diabetes mellitus with other specified complication: Secondary | ICD-10-CM | POA: Diagnosis not present

## 2019-12-02 DIAGNOSIS — M10471 Other secondary gout, right ankle and foot: Secondary | ICD-10-CM

## 2019-12-02 DIAGNOSIS — E785 Hyperlipidemia, unspecified: Secondary | ICD-10-CM

## 2019-12-02 DIAGNOSIS — Z85528 Personal history of other malignant neoplasm of kidney: Secondary | ICD-10-CM | POA: Diagnosis not present

## 2019-12-02 DIAGNOSIS — E039 Hypothyroidism, unspecified: Secondary | ICD-10-CM

## 2019-12-02 DIAGNOSIS — N400 Enlarged prostate without lower urinary tract symptoms: Secondary | ICD-10-CM

## 2019-12-02 MED ORDER — ROSUVASTATIN CALCIUM 5 MG PO TABS: 5.0000 mg | ORAL_TABLET | Freq: Every day | ORAL | 3 refills | Status: DC

## 2019-12-02 MED ORDER — COLCHICINE 0.6 MG PO TABS: ORAL_TABLET | ORAL | 0 refills | Status: DC

## 2019-12-02 NOTE — Progress Notes (Signed)
Location:  [facility] clinic Provider:  Yoandri Congrove L. Mariea Clonts, D.O., C.M.D.  Goals of Care:  Advanced Directives 07/22/2019  Does Patient Have a Medical Advance Directive? Yes  Type of Advance Directive -  Does patient want to make changes to medical advance directive? -  Copy of [place] in Chart? -   Chief Complaint  Patient presents with  . Medical Management of Chronic Issues    4 month follow up, foot red and swollen since 11/30/2019    HPI: Patient is a 79 y.o. male seen today for medical management of chronic diseases.    He also has a concern about his right foot being red, swollen and painful.  Started fri night into sat. Felt it when got up overnight to urinate.  Really in force sat.  Was fairly bad Saturday.  He made a turmeric tea, did cold compresses which did not help swelling but did improve pain.  He could not bend toe, cannot get shoe on d/t swelling.  Yesterday, less painful.  He says he's never had gout, but figures that's what it is.  He did help his friend move his camper the day before and hit his foot on a root.  He did rake leaves and weed eat the yard.  Then pain started big toe backward.  He had filet mignon at [facility] and had not really had red meat in a long time and has not has sausage lately.      Reviewed labs with him.   Lab Results  Component Value Date   HGBA1C 6.0 (H) 11/25/2019  He'd been walking 30 mins per day--he has quit walking in rain.  He likes to walk more at the [facility] when it's nice out.    BP 100-152/60s-80s.  No clear time when high or low on record.    Bad cholesterol remains quite high.  He's been buying 2% milk instead of almond milk.  He's also been on too much cheese.    He has also had fried fish recently.  He also had fried shrimp every other week.  Past Medical History:  Diagnosis Date  . Back pain, lumbosacral 2014  . Cancer of kidney ([place]) 2012  . Cataract 2013  . Diabetes mellitus without complication ([place])  0000000  . Hypertension 2012  . Nephrolithiasis 2012  . Obesity, unspecified   . Other and unspecified hyperlipidemia 04/03/2013  . Pain, joint, ankle and foot   . Unspecified hypothyroidism 2012    Past Surgical History:  Procedure Laterality Date  . CATARACT EXTRACTION W/ INTRAOCULAR LENS IMPLANT Bilateral 03/05/2016   Dr. [name]  . CYST REMOVAL TRUNK  11/03/2017  . KIDNEY SURGERY  12/2010   had cancer removed; Dr. Alinda Money  . ROTATOR CUFF REPAIR     bilateral; Dr. [name]    No Known Allergies  Outpatient Encounter Medications as of 12/02/2019  Medication Sig  . Ascorbic Acid (VITAMIN C) 1000 MG tablet Take 1,000 mg by mouth daily.  Marland Kitchen aspirin 81 MG tablet One daily to revent heart attack or stroke  . Cholecalciferol (VITAMIN D-3) 125 MCG (5000 UT) TABS Take 1 tablet by mouth daily.  . finasteride (PROSCAR) 5 MG tablet Take 5 mg by mouth daily.  Marland Kitchen levothyroxine (SYNTHROID) 50 MCG tablet TAKE 1 TABLET BY MOUTH  DAILY BEFORE BREAKFAST  . losartan (COZAAR) 50 MG tablet Take 1 tablet (50 mg total) by mouth daily.  . Magnesium 500 MG CAPS Take 1 capsule by mouth daily.  Marland Kitchen  metFORMIN (GLUCOPHAGE) 500 MG tablet TAKE ONE TABLET BY MOUTH  TWICE DAILY TO CONTROL  DIABETES  . Multiple Vitamins-Minerals (MULTIVITAMIN WITH MINERALS) tablet Take 1 tablet by mouth daily.  . Omega-3 Fatty Acids (OMEGA 3 500) 500 MG CAPS Take 1 capsule by mouth daily.  Glory Rosebush DELICA LANCETS FINE MISC USE TO CHECK BLOOD SUGAR ONCE DAILY  . ONETOUCH ULTRA test strip USE TO TEST BLOOD SUGAR  ONCE DAILY  . triamcinolone cream (KENALOG) 0.5 % Apply 1 application topically 3 (three) times daily.   No facility-administered encounter medications on file as of 12/02/2019.    Review of Systems:  Review of Systems  Constitutional: Negative for chills, fever and malaise/fatigue.  HENT: Negative for congestion and sore throat.   Eyes: Negative for blurred vision.  Respiratory: Negative for cough and shortness of breath.     Cardiovascular: Negative for chest pain, palpitations and leg swelling.  Gastrointestinal: Negative for abdominal pain, blood in stool, constipation and melena.  Genitourinary: Positive for frequency. Negative for dysuria.       Nocturia  Musculoskeletal: Positive for joint pain. Negative for falls.       Right great toe joint redness, warmth, swelling, pain  Skin: Negative for itching and rash.  Neurological: Positive for weakness. Negative for dizziness and loss of consciousness.  Endo/Heme/Allergies: Does not bruise/bleed easily.  Psychiatric/Behavioral: Negative for depression and memory loss. The patient is not nervous/anxious and does not have insomnia.        Some word-finding difficulty    Health Maintenance  Topic Date Due  . PNA vac Low Risk Adult (2 of 2 - PPSV23) 06/05/2015  . OPHTHALMOLOGY EXAM  01/23/2019  . FOOT EXAM  07/03/2019  . HEMOGLOBIN A1C  05/27/2020  . TETANUS/TDAP  10/17/2027  . INFLUENZA VACCINE  Completed    Physical Exam: Vitals:   12/02/19 0834  Weight: 223 lb 9.6 oz (101.4 kg)   Body mass index is 37.21 kg/m. Physical Exam Vitals reviewed.  Constitutional:      General: He is not in acute distress.    Appearance: He is obese. He is not ill-appearing or toxic-appearing.  HENT:     Head: Normocephalic and atraumatic.  Cardiovascular:     Rate and Rhythm: Normal rate and regular rhythm.     Pulses: Normal pulses.     Heart sounds: Normal heart sounds.  Pulmonary:     Effort: Pulmonary effort is normal.     Breath sounds: Normal breath sounds.  Abdominal:     General: Bowel sounds are normal.  Musculoskeletal:        General: Swelling and tenderness present. No deformity or signs of injury.     Right lower leg: Edema present.     Left lower leg: No edema.     Comments: Slight limit to ROM of right great toe due to pain  Skin:    General: Skin is warm and dry.     Capillary Refill: Capillary refill takes less than 2 seconds.      Comments: Erythema, warmth, tenderness, swelling at first MTP of right foot  Neurological:     General: No focal deficit present.     Mental Status: He is alert and oriented to person, place, and time.  Psychiatric:        Mood and Affect: Mood normal.        Behavior: Behavior normal.     Labs reviewed: Basic Metabolic Panel: Recent Labs    07/17/19 0820  11/25/19 0926  NA 133* 139  K 4.5 4.2  CL 105 107  CO2 22 24  GLUCOSE 101* 98  BUN 17 19  CREATININE 0.96 1.01  CALCIUM 9.5 8.9   Liver Function Tests: No results for input(s): AST, ALT, ALKPHOS, BILITOT, PROT, ALBUMIN in the last 8760 hours. No results for input(s): LIPASE, AMYLASE in the last 8760 hours. No results for input(s): AMMONIA in the last 8760 hours. CBC: Recent Labs    07/17/19 0820  WBC 8.0  NEUTROABS 4,592  HGB 13.9  HCT 41.2  MCV 86.6  PLT 310   Lipid Panel: Recent Labs    07/17/19 0820 11/25/19 0926  CHOL 221* 230*  HDL 48 49  LDLCALC 141* 146*  TRIG 186* 209*  CHOLHDL 4.6 4.7   Lab Results  Component Value Date   HGBA1C 6.0 (H) 11/25/2019    Assessment/Plan 1. Type 2 diabetes mellitus with other specified complication, without long-term current use of insulin (HCC) - hba1c up to 6 from 5.8 -counseled on avoiding fried foods which seems to be culprit recently for increased glucose -cont metformin, get back to regular walking when gout flare resolves - Hemoglobin A1c; Future  2. Hyperlipidemia associated with type 2 diabetes mellitus ([place]) - he was finally agreeable to statin therapy today for his LDL cholesterol which is twice normal at present -also will go back to almond milk and cut down cheese, reports avoiding red meat and sausage already - Lipid panel; Future - rosuvastatin (CRESTOR) 5 MG tablet; Take 1 tablet (5 mg total) by mouth daily.  Dispense: 90 tablet; Refill: 3  3. Hypothyroidism, unspecified type -cont current levothyroxine and monitor Lab Results  Component  Value Date   TSH 2.30 10/29/2018   4. History of renal cell carcinoma - many years ago w/o further issues - BASIC METABOLIC PANEL WITH GFR; Future  5. Benign prostatic hyperplasia, unspecified whether lower urinary tract symptoms present -has nocturia and frequency but no changes lately, cont finasteride  6. Acute gout due to other secondary cause involving toe of right foot - start colchicine two tabs today then one daily until recovers -cont tumeric, elevation, cold compresses, rest  - Uric Acid; Future - colchicine 0.6 MG tablet; Two tablets today and then one daily until pain, redness and swelling resolve  Dispense: 30 tablet; Refill: 0 -begin low purine/uric acid diet  Labs/tests ordered:   Lab Orders     Hemoglobin A1c     Lipid panel     BASIC METABOLIC PANEL WITH GFR     Uric Acid Next appt:  03/30/2020  Kindred Heying L. Kristof Nadeem, D.O. [facility] [address], [place] [address] Cell Phone (Mon-Fri 8am-5pm):  [phone] On Call:  [phone] & follow prompts after 5pm & weekends Office Phone:  [phone] Office Fax:  [phone]

## 2019-12-02 NOTE — Patient Instructions (Signed)
Gout  Gout is painful swelling of your joints. Gout is a type of arthritis. It is caused by having too much uric acid in your body. Uric acid is a chemical that is made when your body breaks down substances called purines. If your body has too much uric acid, sharp crystals can form and build up in your joints. This causes pain and swelling. Gout attacks can happen quickly and be very painful (acute gout). Over time, the attacks can affect more joints and happen more often (chronic gout). What are the causes?  Too much uric acid in your blood. This can happen because: ? Your kidneys do not remove enough uric acid from your blood. ? Your body makes too much uric acid. ? You eat too many foods that are high in purines. These foods include organ meats, some seafood, and beer.  Trauma or stress. What increases the risk?  Having a family history of gout.  Being male and middle-aged.  Being male and having gone through menopause.  Being very overweight (obese).  Drinking alcohol, especially beer.  Not having enough water in the body (being dehydrated).  Losing weight too quickly.  Having an organ transplant.  Having lead poisoning.  Taking certain medicines.  Having kidney disease.  Having a skin condition called psoriasis. What are the signs or symptoms? An attack of acute gout usually happens in just one joint. The most common place is the big toe. Attacks often start at night. Other joints that may be affected include joints of the feet, ankle, knee, fingers, wrist, or elbow. Symptoms of an attack may include:  Very bad pain.  Warmth.  Swelling.  Stiffness.  Shiny, red, or purple skin.  Tenderness. The affected joint may be very painful to touch.  Chills and fever. Chronic gout may cause symptoms more often. More joints may be involved. You may also have white or yellow lumps (tophi) on your hands or feet or in other areas near your joints. How is this  treated?  Treatment for this condition has two phases: treating an acute attack and preventing future attacks.  Acute gout treatment may include: ? NSAIDs. ? Steroids. These are taken by mouth or injected into a joint. ? Colchicine. This medicine relieves pain and swelling. It can be given by mouth or through an IV tube.  Preventive treatment may include: ? Taking small doses of NSAIDs or colchicine daily. ? Using a medicine that reduces uric acid levels in your blood. ? Making changes to your diet. You may need to see a food expert (dietitian) about what to eat and drink to prevent gout. Follow these instructions at home: During a gout attack   If told, put ice on the painful area: ? Put ice in a plastic bag. ? Place a towel between your skin and the bag. ? Leave the ice on for 20 minutes, 2-3 times a day.  Raise (elevate) the painful joint above the level of your heart as often as you can.  Rest the joint as much as possible. If the joint is in your leg, you may be given crutches.  Follow instructions from your doctor about what you cannot eat or drink. Avoiding future gout attacks  Eat a low-purine diet. Avoid foods and drinks such as: ? Liver. ? Kidney. ? Anchovies. ? Asparagus. ? Herring. ? Mushrooms. ? Mussels. ? Beer.  Stay at a healthy weight. If you want to lose weight, talk with your doctor. Do not lose weight   too fast.  Start or continue an exercise plan as told by your doctor. Eating and drinking  Drink enough fluids to keep your pee (urine) pale yellow.  If you drink alcohol: ? Limit how much you use to:  0-1 drink a day for women.  0-2 drinks a day for men. ? Be aware of how much alcohol is in your drink. In the [place] one drink equals one 12 oz bottle of beer (355 mL), one 5 oz glass of wine (148 mL), or one 1 oz glass of hard liquor (44 mL). General instructions  Take over-the-counter and prescription medicines only as told by your doctor.  Do  not drive or use heavy machinery while taking prescription pain medicine.  Return to your normal activities as told by your doctor. Ask your doctor what activities are safe for you.  Keep all follow-up visits as told by your doctor. This is important. Contact a doctor if:  You have another gout attack.  You still have symptoms of a gout attack after 10 days of treatment.  You have problems (side effects) because of your medicines.  You have chills or a fever.  You have burning pain when you pee (urinate).  You have pain in your lower back or belly. Get help right away if:  You have very bad pain.  Your pain cannot be controlled.  You cannot pee. Summary  Gout is painful swelling of the joints.  The most common site of pain is the big toe, but it can affect other joints.  Medicines and avoiding some foods can help to prevent and treat gout attacks. This information is not intended to replace advice given to you by your health care provider. Make sure you discuss any questions you have with your health care provider. Document Revised: 03/14/2018 Document Reviewed: 03/14/2018 Elsevier Patient Education  2020 Elsevier Inc.    Low-Purine Eating Plan A low-purine eating plan involves making food choices to limit your intake of purine. Purine is a kind of uric acid. Too much uric acid in your blood can cause certain conditions, such as gout and kidney stones. Eating a low-purine diet can help control these conditions. What are tips for following this plan? Reading food labels   Avoid foods with saturated or Trans fat.  Check the ingredient list of grains-based foods, such as bread and cereal, to make sure that they contain whole grains.  Check the ingredient list of sauces or soups to make sure they do not contain meat or fish.  When choosing soft drinks, check the ingredient list to make sure they do not contain high-fructose corn syrup. Shopping  Buy plenty of fresh  fruits and vegetables.  Avoid buying canned or fresh fish.  Buy dairy products labeled as low-fat or nonfat.  Avoid buying premade or processed foods. These foods are often high in fat, salt (sodium), and added sugar. Cooking  Use olive oil instead of butter when cooking. Oils like olive oil, canola oil, and sunflower oil contain healthy fats. Meal planning  Learn which foods do or do not affect you. If you find out that a food tends to cause your gout symptoms to flare up, avoid eating that food. You can enjoy foods that do not cause problems. If you have any questions about a food item, talk with your dietitian or health care provider.  Limit foods high in fat, especially saturated fat. Fat makes it harder for your body to get rid of uric acid.    Choose foods that are lower in fat and are lean sources of protein. General guidelines  Limit alcohol intake to no more than 1 drink a day for nonpregnant women and 2 drinks a day for men. One drink equals 12 oz of beer, 5 oz of wine, or 1 oz of hard liquor. Alcohol can affect the way your body gets rid of uric acid.  Drink plenty of water to keep your urine clear or pale yellow. Fluids can help remove uric acid from your body.  If directed by your health care provider, take a vitamin C supplement.  Work with your health care provider and dietitian to develop a plan to achieve or maintain a healthy weight. Losing weight can help reduce uric acid in your blood. What foods are recommended? The items listed may not be a complete list. Talk with your dietitian about what dietary choices are best for you. Foods low in purines Foods low in purines do not need to be limited. These include:  All fruits.  All low-purine vegetables, pickles, and olives.  Breads, pasta, rice, cornbread, and popcorn. Cake and other baked goods.  All dairy foods.  Eggs, nuts, and nut butters.  Spices and condiments, such as salt, herbs, and vinegar.  Plant  oils, butter, and margarine.  Water, sugar-free soft drinks, tea, coffee, and cocoa.  Vegetable-based soups, broths, sauces, and gravies. Foods moderate in purines Foods moderate in purines should be limited to the amounts listed.   cup of asparagus, cauliflower, spinach, mushrooms, or green peas, each day.  2/3 cup uncooked oatmeal, each day.   cup dry wheat bran or wheat germ, each day.  2-3 ounces of meat or poultry, each day.  4-6 ounces of shellfish, such as crab, lobster, oysters, or shrimp, each day.  1 cup cooked beans, peas, or lentils, each day.  Soup, broths, or bouillon made from meat or fish. Limit these foods as much as possible. What foods are not recommended? The items listed may not be a complete list. Talk with your dietitian about what dietary choices are best for you. Limit your intake of foods high in purines, including:  Beer and other alcohol.  Meat-based gravy or sauce.  Canned or fresh fish, such as: ? Anchovies, sardines, herring, and tuna. ? Mussels and scallops. ? Codfish, trout, and haddock.  Bacon.  Organ meats, such as: ? Liver or kidney. ? Tripe. ? Sweetbreads (thymus gland or pancreas).  Wild game or goose.  Yeast or yeast extract supplements.  Drinks sweetened with high-fructose corn syrup. Summary  Eating a low-purine diet can help control conditions caused by too much uric acid in the body, such as gout or kidney stones.  Choose low-purine foods, limit alcohol, and limit foods high in fat.  You will learn over time which foods do or do not affect you. If you find out that a food tends to cause your gout symptoms to flare up, avoid eating that food. This information is not intended to replace advice given to you by your health care provider. Make sure you discuss any questions you have with your health care provider. Document Revised: 08/04/2017 Document Reviewed: 10/05/2016 Elsevier Patient Education  2020 Elsevier  Inc.   

## 2020-01-03 ENCOUNTER — Other Ambulatory Visit: Payer: Self-pay

## 2020-01-03 MED ORDER — OMEPRAZOLE 20 MG PO CPDR: 20.0000 mg | DELAYED_RELEASE_CAPSULE | Freq: Every day | ORAL | 2 refills | Status: DC

## 2020-01-03 NOTE — Telephone Encounter (Signed)
Patient called and requested refill on this medication. Patient states medication hasn't been filled in a while. Medication pend and sent to provider Ngetich,Dinah due to [employer] [facility], DO being out of office. Please Advise.

## 2020-01-27 ENCOUNTER — Telehealth: Payer: Self-pay

## 2020-01-27 MED ORDER — OMEPRAZOLE 20 MG PO CPDR: 20.0000 mg | DELAYED_RELEASE_CAPSULE | Freq: Every day | ORAL | 2 refills | Status: DC

## 2020-01-27 NOTE — Telephone Encounter (Signed)
Patient called requesting medication be sent to [facility]. He stated that he has called three times trying to get it filled.

## 2020-02-17 ENCOUNTER — Encounter: Payer: Medicare Other | Admitting: Family

## 2020-02-24 ENCOUNTER — Encounter: Payer: Medicare Other | Admitting: Internal Medicine

## 2020-02-25 ENCOUNTER — Other Ambulatory Visit: Payer: Self-pay

## 2020-02-25 ENCOUNTER — Telehealth: Payer: Self-pay

## 2020-02-25 ENCOUNTER — Encounter: Payer: Self-pay | Admitting: Nurse Practitioner

## 2020-02-25 ENCOUNTER — Ambulatory Visit (INDEPENDENT_AMBULATORY_CARE_PROVIDER_SITE_OTHER): Payer: Medicare Other | Admitting: Nurse Practitioner

## 2020-02-25 DIAGNOSIS — Z0000 Encounter for general adult medical examination without abnormal findings: Secondary | ICD-10-CM | POA: Diagnosis not present

## 2020-02-25 NOTE — Telephone Encounter (Signed)
Nicholas Rivera, Nicholas Rivera are scheduled for a virtual visit with your provider today.    Just as we do with appointments in the office, we must obtain your consent to participate.  Your consent will be active for this visit and any virtual visit you may have with one of our providers in the next 365 days.    If you have a MyChart account, I can also send a copy of this consent to you electronically.  All virtual visits are billed to your insurance company just like a traditional visit in the office.  As this is a virtual visit, video technology does not allow for your provider to perform a traditional examination.  This may limit your provider's ability to fully assess your condition.  If your provider identifies any concerns that need to be evaluated in person or the need to arrange testing such as labs, EKG, etc, we will make arrangements to do so.    Although advances in technology are sophisticated, we cannot ensure that it will always work on either your end or our end.  If the connection with a video visit is poor, we may have to switch to a telephone visit.  With either a video or telephone visit, we are not always able to ensure that we have a secure connection.   I need to obtain your verbal consent now.   Are you willing to proceed with your visit today?   Nicholas Rivera has provided verbal consent on 02/25/2020 for a virtual visit (video or telephone).   Carroll Kinds, CMA 02/25/2020  8:30 AM

## 2020-02-25 NOTE — Patient Instructions (Signed)
Nicholas Rivera , Thank you for taking time to come for your Medicare Wellness Visit. I appreciate your ongoing commitment to your health goals. Please review the following plan we discussed and let me know if I can assist you in the future.   Screening recommendations/referrals: Colonoscopy aged out. Recommended yearly ophthalmology/optometry visit for glaucoma screening and checkup Recommended yearly dental visit for hygiene and checkup  Vaccinations: Influenza vaccine up to date Pneumococcal vaccine recommended to update pneumonia 23 vaccine, can get at office or pharmacy Tdap vaccine RECOMMENDED- to get at your local pharmacy Shingles vaccine RECOMMENDED- to get shingrix vaccine at your local pharmacy  Advanced directives: on file  Conditions/risks identified: advance age, dental disease, diabetes, hypertension  Next appointment: 1 year.   Preventive Care 49 Years and Older, Male Preventive care refers to lifestyle choices and visits with your health care provider that can promote health and wellness. What does preventive care include?  A yearly physical exam. This is also called an annual well check.  Dental exams once or twice a year.  Routine eye exams. Ask your health care provider how often you should have your eyes checked.  Personal lifestyle choices, including:  Daily care of your teeth and gums.  Regular physical activity.  Eating a healthy diet.  Avoiding tobacco and drug use.  Limiting alcohol use.  Practicing safe sex.  Taking low doses of aspirin every day.  Taking vitamin and mineral supplements as recommended by your health care provider. What happens during an annual well check? The services and screenings done by your health care provider during your annual well check will depend on your age, overall health, lifestyle risk factors, and family history of disease. Counseling  Your health care provider may ask you questions about your:  Alcohol  use.  Tobacco use.  Drug use.  Emotional well-being.  Home and relationship well-being.  Sexual activity.  Eating habits.  History of falls.  Memory and ability to understand (cognition).  Work and work Statistician. Screening  You may have the following tests or measurements:  Height, weight, and BMI.  Blood pressure.  Lipid and cholesterol levels. These may be checked every 5 years, or more frequently if you are over 33 years old.  Skin check.  Lung cancer screening. You may have this screening every year starting at age 46 if you have a 30-pack-year history of smoking and currently smoke or have quit within the past 15 years.  Fecal occult blood test (FOBT) of the stool. You may have this test every year starting at age 52.  Flexible sigmoidoscopy or colonoscopy. You may have a sigmoidoscopy every 5 years or a colonoscopy every 10 years starting at age 38.  Prostate cancer screening. Recommendations will vary depending on your family history and other risks.  Hepatitis C blood test.  Hepatitis B blood test.  Sexually transmitted disease (STD) testing.  Diabetes screening. This is done by checking your blood sugar (glucose) after you have not eaten for a while (fasting). You may have this done every 1-3 years.  Abdominal aortic aneurysm (AAA) screening. You may need this if you are a current or former smoker.  Osteoporosis. You may be screened starting at age 37 if you are at high risk. Talk with your health care provider about your test results, treatment options, and if necessary, the need for more tests. Vaccines  Your health care provider may recommend certain vaccines, such as:  Influenza vaccine. This is recommended every year.  Tetanus,  diphtheria, and acellular pertussis (Tdap, Td) vaccine. You may need a Td booster every 10 years.  Zoster vaccine. You may need this after age 32.  Pneumococcal 13-valent conjugate (PCV13) vaccine. One dose is  recommended after age 83.  Pneumococcal polysaccharide (PPSV23) vaccine. One dose is recommended after age 62. Talk to your health care provider about which screenings and vaccines you need and how often you need them. This information is not intended to replace advice given to you by your health care provider. Make sure you discuss any questions you have with your health care provider. Document Released: 09/18/2015 Document Revised: 05/11/2016 Document Reviewed: 06/23/2015 Elsevier Interactive Patient Education  2017 [place] Prevention in the Home Falls can cause injuries. They can happen to people of all ages. There are many things you can do to make your home safe and to help prevent falls. What can I do on the outside of my home?  Regularly fix the edges of walkways and driveways and fix any cracks.  Remove anything that might make you trip as you walk through a door, such as a raised step or threshold.  Trim any bushes or trees on the path to your home.  Use bright outdoor lighting.  Clear any walking paths of anything that might make someone trip, such as rocks or tools.  Regularly check to see if handrails are loose or broken. Make sure that both sides of any steps have handrails.  Any raised decks and porches should have guardrails on the edges.  Have any leaves, snow, or ice cleared regularly.  Use sand or salt on walking paths during winter.  Clean up any spills in your garage right away. This includes oil or grease spills. What can I do in the bathroom?  Use night lights.  Install grab bars by the toilet and in the tub and shower. Do not use towel bars as grab bars.  Use non-skid mats or decals in the tub or shower.  If you need to sit down in the shower, use a plastic, non-slip stool.  Keep the floor dry. Clean up any water that spills on the floor as soon as it happens.  Remove soap buildup in the tub or shower regularly.  Attach bath mats  securely with double-sided non-slip rug tape.  Do not have throw rugs and other things on the floor that can make you trip. What can I do in the bedroom?  Use night lights.  Make sure that you have a light by your bed that is easy to reach.  Do not use any sheets or blankets that are too big for your bed. They should not hang down onto the floor.  Have a firm chair that has side arms. You can use this for support while you get dressed.  Do not have throw rugs and other things on the floor that can make you trip. What can I do in the kitchen?  Clean up any spills right away.  Avoid walking on wet floors.  Keep items that you use a lot in easy-to-reach places.  If you need to reach something above you, use a strong step stool that has a grab bar.  Keep electrical cords out of the way.  Do not use floor polish or wax that makes floors slippery. If you must use wax, use non-skid floor wax.  Do not have throw rugs and other things on the floor that can make you trip. What can I do with  my stairs?  Do not leave any items on the stairs.  Make sure that there are handrails on both sides of the stairs and use them. Fix handrails that are broken or loose. Make sure that handrails are as long as the stairways.  Check any carpeting to make sure that it is firmly attached to the stairs. Fix any carpet that is loose or worn.  Avoid having throw rugs at the top or bottom of the stairs. If you do have throw rugs, attach them to the floor with carpet tape.  Make sure that you have a light switch at the top of the stairs and the bottom of the stairs. If you do not have them, ask someone to add them for you. What else can I do to help prevent falls?  Wear shoes that:  Do not have high heels.  Have rubber bottoms.  Are comfortable and fit you well.  Are closed at the toe. Do not wear sandals.  If you use a stepladder:  Make sure that it is fully opened. Do not climb a closed  stepladder.  Make sure that both sides of the stepladder are locked into place.  Ask someone to hold it for you, if possible.  Clearly mark and make sure that you can see:  Any grab bars or handrails.  First and last steps.  Where the edge of each step is.  Use tools that help you move around (mobility aids) if they are needed. These include:  Canes.  Walkers.  Scooters.  Crutches.  Turn on the lights when you go into a dark area. Replace any light bulbs as soon as they burn out.  Set up your furniture so you have a clear path. Avoid moving your furniture around.  If any of your floors are uneven, fix them.  If there are any pets around you, be aware of where they are.  Review your medicines with your doctor. Some medicines can make you feel dizzy. This can increase your chance of falling. Ask your doctor what other things that you can do to help prevent falls. This information is not intended to replace advice given to you by your health care provider. Make sure you discuss any questions you have with your health care provider. Document Released: 06/18/2009 Document Revised: 01/28/2016 Document Reviewed: 09/26/2014 Elsevier Interactive Patient Education  2017 [employer].

## 2020-02-25 NOTE — Progress Notes (Signed)
Subjective:   Nicholas Rivera is a 79 y.o. male who presents for Medicare Annual/Subsequent preventive examination.  Review of Systems     Cardiac Risk Factors include: advanced age (>67men, >43 women);diabetes mellitus;obesity (BMI >30kg/m2);male gender;hypertension     Objective:    There were no vitals filed for this visit. There is no height or weight on file to calculate BMI.  Advanced Directives 02/25/2020 12/02/2019 07/22/2019 02/15/2019 02/12/2018 10/16/2017 06/12/2017  Does Patient Have a Medical Advance Directive? Yes Yes Yes Yes Yes Yes Yes  Type of Programmer, systems - - Healthcare Power of [employer] Power of [place];Living will [place]  Does patient want to make changes to medical advance directive? No - Patient declined No - Patient declined - No - Patient declined No - Patient declined - -  Copy of [place] in Chart? Yes - validated most recent copy scanned in chart (See row information) Yes - validated most recent copy scanned in chart (See row information) - - Yes Yes Yes    Current Medications (verified) Outpatient Encounter Medications as of 02/25/2020  Medication Sig  . Ascorbic Acid (VITAMIN C) 1000 MG tablet Take 1,000 mg by mouth daily.  Marland Kitchen aspirin 81 MG tablet One daily to revent heart attack or stroke  . Cholecalciferol (VITAMIN D-3) 125 MCG (5000 UT) TABS Take 1 tablet by mouth daily.  . Cyanocobalamin (VITAMIN B 12 PO) Take 1,000 mcg by mouth.  . finasteride (PROSCAR) 5 MG tablet Take 5 mg by mouth daily.  Marland Kitchen levothyroxine (SYNTHROID) 50 MCG tablet TAKE 1 TABLET BY MOUTH  DAILY BEFORE BREAKFAST  . losartan (COZAAR) 50 MG tablet Take 1 tablet (50 mg total) by mouth daily.  . Magnesium 500 MG CAPS Take 1 capsule by mouth daily.  . metFORMIN (GLUCOPHAGE) 500 MG tablet TAKE ONE TABLET BY MOUTH  TWICE DAILY TO CONTROL  DIABETES  . Multiple Vitamins-Minerals  (MULTIVITAMIN WITH MINERALS) tablet Take 1 tablet by mouth daily.  . Omega-3 Fatty Acids (OMEGA 3 500) 500 MG CAPS Take 1 capsule by mouth daily.  Marland Kitchen omeprazole (PRILOSEC) 20 MG capsule Take 1 capsule (20 mg total) by mouth daily.  Glory Rosebush DELICA LANCETS FINE MISC USE TO CHECK BLOOD SUGAR ONCE DAILY  . ONETOUCH ULTRA test strip USE TO TEST BLOOD SUGAR  ONCE DAILY  . rosuvastatin (CRESTOR) 5 MG tablet Take 1 tablet (5 mg total) by mouth daily.  . [DISCONTINUED] colchicine 0.6 MG tablet Two tablets today and then one daily until pain, redness and swelling resolve   No facility-administered encounter medications on file as of 02/25/2020.    Allergies (verified) Patient has no known allergies.   History: Past Medical History:  Diagnosis Date  . Back pain, lumbosacral 2014  . Cancer of kidney ([place]) 2012  . Cataract 2013  . Diabetes mellitus without complication ([place]) 8921  . Hypertension 2012  . Nephrolithiasis 2012  . Obesity, unspecified   . Other and unspecified hyperlipidemia 04/03/2013  . Pain, joint, ankle and foot   . Unspecified hypothyroidism 2012   Past Surgical History:  Procedure Laterality Date  . CATARACT EXTRACTION W/ INTRAOCULAR LENS IMPLANT Bilateral 03/05/2016   Dr. [name]  . CYST REMOVAL TRUNK  11/03/2017  . KIDNEY SURGERY  12/2010   had cancer removed; Dr. Alinda Money  . ROTATOR CUFF REPAIR     bilateral; Dr. [name]   History reviewed. No pertinent family history.  Social History   Socioeconomic History  . Marital status: Married    Spouse name: Not on file  . Number of children: Not on file  . Years of education: Not on file  . Highest education level: Not on file  Occupational History  . Occupation: Retired  Tobacco Use  . Smoking status: Never Smoker  . Smokeless tobacco: Never Used  Vaping Use  . Vaping Use: Never used  Substance and Sexual Activity  . Alcohol use: No  . Drug use: No  . Sexual activity: Yes  Other Topics Concern  . Not on file    Social History Narrative   Patient was previously married. His wife has died from cancer.   Remarried to Ronalee Belts   Never smoked   Alcohol none   Exercise 30 minutes daily   POA   Social Determinants of Health   Financial Resource Strain:   . Difficulty of Paying Living Expenses:   Food Insecurity:   . Worried About Charity fundraiser in the Last Year:   . Arboriculturist in the Last Year:   Transportation Needs:   . Film/video editor (Medical):   Marland Kitchen Lack of Transportation (Non-Medical):   Physical Activity:   . Days of Exercise per Week:   . Minutes of Exercise per Session:   Stress:   . Feeling of Stress :   Social Connections:   . Frequency of Communication with Friends and Family:   . Frequency of Social Gatherings with Friends and Family:   . Attends Religious Services:   . Active Member of Clubs or Organizations:   . Attends Archivist Meetings:   Marland Kitchen Marital Status:     Tobacco Counseling Counseling given: Not Answered   Clinical Intake:  Pre-visit preparation completed: Yes  Pain : No/denies pain     BMI - recorded: 36 Nutritional Status: BMI > 30  Obese Diabetes: Yes CBG done?: No  How often do you need to have someone help you when you read instructions, pamphlets, or other written materials from your doctor or pharmacy?: 1 - Never  Diabetic?yes         Activities of Daily Living In your present state of health, do you have any difficulty performing the following activities: 02/25/2020  Hearing? N  Vision? N  Difficulty concentrating or making decisions? N  Walking or climbing stairs? N  Dressing or bathing? N  Doing errands, shopping? N  Preparing Food and eating ? N  Using the Toilet? N  In the past six months, have you accidently leaked urine? N  Do you have problems with loss of bowel control? N  Managing your Medications? N  Managing your Finances? N  Housekeeping or managing your Housekeeping? N  Some recent data  might be hidden    Patient Care Team: Gayland Curry, DO as PCP - General (Geriatric Medicine) [name], MD as Consulting Physician (Urology) [name], MD as Consulting Physician (Ophthalmology) [name], MD as Consulting Physician (Ophthalmology)  Indicate any recent Medical Services you may have received from other than [facility] providers in the past year (date may be approximate).     Assessment:   This is a routine wellness examination for [place].  Hearing/Vision screen  Hearing Screening   125Hz  250Hz  500Hz  1000Hz  2000Hz  3000Hz  4000Hz  6000Hz  8000Hz   Right ear:           Left ear:  Comments: No hearing issues  Vision Screening Comments: Patient wears reading glasses. Patient has not had a recent eye exam.  Dietary issues and exercise activities discussed: Current Exercise Habits: Home exercise routine, Type of exercise: walking, Time (Minutes): 30  Goals    . Weight (lb) < 175 lb (79.4 kg)     Starting 01/06/2017 I would like to continue working to my goal weight.      Depression Screen PHQ 2/9 Scores 02/25/2020 08/23/2019 07/22/2019 06/12/2019 03/18/2019 02/15/2019 11/05/2018  PHQ - 2 Score 0 0 0 0 0 0 0    Fall Risk Fall Risk  02/25/2020 12/02/2019 08/23/2019 07/22/2019 06/12/2019  Falls in the past year? 1 0 0 1 1  Number falls in past yr: 0 0 0 0 0  Injury with Fall? 0 0 0 0 1  Risk for fall due to : - - - - -  Follow up - - - - -    Any stairs in or around the home? Yes  If so, are there any without handrails? Yes  Home free of loose throw rugs in walkways, pet beds, electrical cords, etc? Yes  Adequate lighting in your home to reduce risk of falls? Yes   ASSISTIVE DEVICES UTILIZED TO PREVENT FALLS:  Life alert? No  Use of a cane, walker or w/c? No  Grab bars in the bathroom? No  Shower chair or bench in shower? No  Elevated toilet seat or a handicapped toilet? No   TIMED UP AND GO:  Was the test performed? No .  Length of time to  ambulate na Cognitive Function: MMSE - Mini Mental State Exam 02/12/2018 01/06/2017 04/05/2016 06/04/2014  Orientation to time 5 5 5 5   Orientation to Place 5 5 5 5   Registration 3 3 3 3   Attention/ Calculation 4 5 5 5   Recall 2 3 2 3   Language- name 2 objects 2 2 2 2   Language- repeat 1 1 1 1   Language- follow 3 step command 3 3 3 3   Language- read & follow direction 1 1 1 1   Write a sentence 1 1 1 1   Copy design 1 1 1 1   Total score 28 30 29 30      6CIT Screen 02/25/2020 02/15/2019  What Year? 0 points 0 points  What month? 0 points 0 points  What time? 0 points 0 points  Count back from 20 0 points 0 points  Months in reverse 0 points 0 points  Repeat phrase 0 points 0 points  Total Score 0 0    Immunizations Immunization History  Administered Date(s) Administered  . Influenza, High Dose Seasonal PF 06/20/2017, 06/02/2018, 04/30/2019  . Influenza,inj,Quad PF,6+ Mos 05/13/2015  . Influenza-Unspecified 06/05/2013, 06/20/2014, 05/30/2016  . PFIZER SARS-COV-2 Vaccination 09/22/2019, 10/13/2019  . Pneumococcal Conjugate-13 06/04/2014  . Pneumococcal Polysaccharide-23 09/05/1998  . Td 09/06/2003, 10/16/2017    TDAP status: Due, Education has been provided regarding the importance of this vaccine. Advised may receive this vaccine at local pharmacy or Health Dept. Aware to provide a copy of the vaccination record if obtained from local pharmacy or Health Dept. Verbalized acceptance and understanding. FLU VACCINE UP TO DATE Pneumococcal vaccine status: Up to date Covid-19 vaccine status: Completed vaccines  Qualifies for Shingles Vaccine? Yes   Zostavax completed No   Shingrix Completed?: No.    Education has been provided regarding the importance of this vaccine. Patient has been advised to call insurance company to determine out of pocket expense if  they have not yet received this vaccine. Advised may also receive vaccine at local pharmacy or Health Dept. Verbalized acceptance and  understanding.  Screening Tests Health Maintenance  Topic Date Due  . Hepatitis C Screening  Never done  . PNA vac Low Risk Adult (2 of 2 - PPSV23) 06/05/2015  . OPHTHALMOLOGY EXAM  01/23/2019  . FOOT EXAM  07/03/2019  . INFLUENZA VACCINE  04/05/2020  . HEMOGLOBIN A1C  05/27/2020  . TETANUS/TDAP  10/17/2027  . COVID-19 Vaccine  Completed    Health Maintenance  Health Maintenance Due  Topic Date Due  . Hepatitis C Screening  Never done  . PNA vac Low Risk Adult (2 of 2 - PPSV23) 06/05/2015  . OPHTHALMOLOGY EXAM  01/23/2019  . FOOT EXAM  07/03/2019    Colorectal cancer screening: No longer required.   Lung Cancer Screening: (Low Dose CT Chest recommended if Age 13-80 years, 30 pack-year currently smoking OR have quit w/in 15years.) does not qualify.   Lung Cancer Screening Referral: na  Additional Screening:  Hepatitis C Screening: does qualify; unsure if he wants to have this screeening.  Vision Screening: Recommended annual ophthalmology exams for early detection of glaucoma and other disorders of the eye. Is the patient up to date with their annual eye exam?  No  Who is the provider or what is the name of the office in which the patient attends annual eye exams? [facility]   Dental Screening: Recommended annual dental exams for proper oral hygiene- does not plan on going "do not talk to me about dentist"  Community Resource Referral / Chronic Care Management: CRR required this visit?  No   CCM required this visit?  No      Plan:     I have personally reviewed and noted the following in the patient's chart:   . Medical and social history . Use of alcohol, tobacco or illicit drugs  . Current medications and supplements . Functional ability and status . Nutritional status . Physical activity . Advanced directives . List of other physicians . Hospitalizations, surgeries, and ER visits in previous 12 months . Vitals . Screenings to include cognitive, depression,  and falls . Referrals and appointments  In addition, I have reviewed and discussed with patient certain preventive protocols, quality metrics, and best practice recommendations. A written personalized care plan for preventive services as well as general preventive health recommendations were provided to patient.     Lauree Chandler, NP   02/25/2020

## 2020-02-25 NOTE — Progress Notes (Signed)
This service is provided via telemedicine  No vital signs collected/recorded due to the encounter was a telemedicine visit.   Location of patient (ex: home, work):  Home  Patient consents to a telephone visit:  Yes, see telephone encounter dated 02/25/2020  Location of the provider (ex: office, home):  [employer]  Name of any referring provider:  Hollace Kinnier, DO  Names of all persons participating in the telemedicine service and their role in the encounter:  Sherrie Mustache, Nurse Practitioner, Carroll Kinds, CMA, and patient.   Time spent on call:  7 minutes with medical assistant

## 2020-03-19 ENCOUNTER — Other Ambulatory Visit: Payer: Self-pay | Admitting: Internal Medicine

## 2020-03-19 DIAGNOSIS — E039 Hypothyroidism, unspecified: Secondary | ICD-10-CM

## 2020-03-23 ENCOUNTER — Other Ambulatory Visit: Payer: Medicare Other

## 2020-03-23 ENCOUNTER — Other Ambulatory Visit: Payer: Self-pay

## 2020-03-23 DIAGNOSIS — E1169 Type 2 diabetes mellitus with other specified complication: Secondary | ICD-10-CM | POA: Diagnosis not present

## 2020-03-23 DIAGNOSIS — Z85528 Personal history of other malignant neoplasm of kidney: Secondary | ICD-10-CM | POA: Diagnosis not present

## 2020-03-23 DIAGNOSIS — M10471 Other secondary gout, right ankle and foot: Secondary | ICD-10-CM | POA: Diagnosis not present

## 2020-03-23 DIAGNOSIS — E785 Hyperlipidemia, unspecified: Secondary | ICD-10-CM | POA: Diagnosis not present

## 2020-03-24 LAB — LIPID PANEL
Cholesterol: 169 mg/dL (ref <200)
HDL: 59 mg/dL (ref >=40)
LDL Cholesterol (Calc): 77 mg/dL (calc)
Non-HDL Cholesterol (Calc): 110 mg/dL (calc) (ref <130)
Total CHOL/HDL Ratio: 2.9 (calc) (ref <5.0)
Triglycerides: 253 mg/dL — ABNORMAL HIGH (ref <150)

## 2020-03-24 LAB — BASIC METABOLIC PANEL WITH GFR
BUN: 12 mg/dL (ref 7–25)
CO2: 24 mmol/L (ref 20–32)
Calcium: 9 mg/dL (ref 8.6–10.3)
Chloride: 104 mmol/L (ref 98–110)
Creat: 0.97 mg/dL (ref 0.70–1.18)
GFR, Est African American: 87 mL/min/{1.73_m2} (ref >=60)
GFR, Est Non African American: 75 mL/min/{1.73_m2} (ref >=60)
Glucose, Bld: 88 mg/dL (ref 65–99)
Potassium: 4.7 mmol/L (ref 3.5–5.3)
Sodium: 138 mmol/L (ref 135–146)

## 2020-03-24 LAB — HEMOGLOBIN A1C
Hgb A1c MFr Bld: 5.9 % of total Hgb — ABNORMAL HIGH (ref <5.7)
Mean Plasma Glucose: 123 (calc)
eAG (mmol/L): 6.8 (calc)

## 2020-03-24 LAB — URIC ACID: Uric Acid, Serum: 6 mg/dL (ref 4.0–8.0)

## 2020-03-24 NOTE — Progress Notes (Signed)
Overall better labs Uric acid is at 6 with goal less than 6 in gout patients Kidneys and electrolytes are normal Bad cholesterol is much improved--cut in half since march Good cholesterol has improved, as well Triglycerides oddly have gone up (starchy fats)--this value is highly affected by recent intake within the past weeks Sugar average has improved from 6 to 5.9

## 2020-03-30 ENCOUNTER — Encounter: Payer: Self-pay | Admitting: Internal Medicine

## 2020-03-30 ENCOUNTER — Other Ambulatory Visit: Payer: Self-pay

## 2020-03-30 ENCOUNTER — Ambulatory Visit (INDEPENDENT_AMBULATORY_CARE_PROVIDER_SITE_OTHER): Payer: Medicare Other | Admitting: Internal Medicine

## 2020-03-30 VITALS — BP 126/72 | HR 78 | Temp 97.5°F | Ht 63.0 in | Wt 224.0 lb

## 2020-03-30 DIAGNOSIS — Z6839 Body mass index (BMI) 39.0-39.9, adult: Secondary | ICD-10-CM | POA: Diagnosis not present

## 2020-03-30 DIAGNOSIS — E1169 Type 2 diabetes mellitus with other specified complication: Secondary | ICD-10-CM | POA: Diagnosis not present

## 2020-03-30 DIAGNOSIS — E039 Hypothyroidism, unspecified: Secondary | ICD-10-CM

## 2020-03-30 DIAGNOSIS — E785 Hyperlipidemia, unspecified: Secondary | ICD-10-CM

## 2020-03-30 DIAGNOSIS — I872 Venous insufficiency (chronic) (peripheral): Secondary | ICD-10-CM | POA: Diagnosis not present

## 2020-03-30 NOTE — Patient Instructions (Addendum)
Try wearing compression socks for a week and see if it helps your swelling you've been getting.  If it does, continue to wear them.  If the ones you have are not adequate, try getting some from [facility] or call me for a prescription to get from the medical supply.

## 2020-03-30 NOTE — Progress Notes (Signed)
Location:  [facility] clinic Provider:  Braeley Buskey L. Mariea Clonts, D.O., C.M.D.  Goals of Care:  Advanced Directives 03/30/2020  Does Patient Have a Medical Advance Directive? Yes  Type of Advance Directive [place]  Does patient want to make changes to medical advance directive? No - Patient declined  Copy of [place] in Chart? -     Chief Complaint  Patient presents with  . Medical Management of Chronic Issues    4 month follow up    HPI: Patient is a 79 y.o. male seen today for medical management of chronic diseases.    He thinks he is storing water b/c he's gained weight and feels like he is eating less.  He is getting swelling by the end of the day.  Not bad early in the am.  He says he cannot do what he did at 22.  He can walk 30 mins up and [place]--is out of breath, but does not have to stop.  It's not changed for him.  No chest pain.  About to use flax seed pills for fiber also.    Says in 1 week, he can gain 4 lbs in 2-3 days.  Avoid sodium.    Taking B12 supplement now--does not know his dose.    No gout flares outside of generalized foot swelling.  Uric acid level was 6.  He'd had the redness and swelling of his right great toe--not that painful.    Cholesterol improved with increased whole grains and fiber, already avoids red meat.    Sugar average improved from 6 to 5.9.    Past Medical History:  Diagnosis Date  . Back pain, lumbosacral 2014  . Cancer of kidney ([place]) 2012  . Cataract 2013  . Diabetes mellitus without complication ([place]) 3614  . Hypertension 2012  . Nephrolithiasis 2012  . Obesity, unspecified   . Other and unspecified hyperlipidemia 04/03/2013  . Pain, joint, ankle and foot   . Unspecified hypothyroidism 2012    Past Surgical History:  Procedure Laterality Date  . CATARACT EXTRACTION W/ INTRAOCULAR LENS IMPLANT Bilateral 03/05/2016   Dr. [name]  . CYST REMOVAL TRUNK  11/03/2017  . KIDNEY  SURGERY  12/2010   had cancer removed; Dr. Alinda Money  . ROTATOR CUFF REPAIR     bilateral; Dr. [name]    No Known Allergies  Outpatient Encounter Medications as of 03/30/2020  Medication Sig  . Ascorbic Acid (VITAMIN C) 1000 MG tablet Take 1,000 mg by mouth daily.  Marland Kitchen aspirin 81 MG tablet One daily to revent heart attack or stroke  . Cholecalciferol (VITAMIN D-3) 125 MCG (5000 UT) TABS Take 1 tablet by mouth daily.  . Cyanocobalamin (VITAMIN B 12 PO) Take 1,000 mcg by mouth.  . finasteride (PROSCAR) 5 MG tablet Take 5 mg by mouth daily.  Marland Kitchen levothyroxine (SYNTHROID) 50 MCG tablet TAKE 1 TABLET BY MOUTH  DAILY BEFORE BREAKFAST  . losartan (COZAAR) 50 MG tablet Take 1 tablet (50 mg total) by mouth daily.  . Magnesium 500 MG CAPS Take 1 capsule by mouth daily.  . metFORMIN (GLUCOPHAGE) 500 MG tablet TAKE ONE TABLET BY MOUTH  TWICE DAILY TO CONTROL  DIABETES  . Multiple Vitamins-Minerals (MULTIVITAMIN WITH MINERALS) tablet Take 1 tablet by mouth daily.  . Omega-3 Fatty Acids (OMEGA 3 500) 500 MG CAPS Take 1 capsule by mouth daily.  Marland Kitchen omeprazole (PRILOSEC) 20 MG capsule Take 1 capsule (20 mg total) by mouth  daily.  Glory Rosebush DELICA LANCETS FINE MISC USE TO CHECK BLOOD SUGAR ONCE DAILY  . ONETOUCH ULTRA test strip USE TO TEST BLOOD SUGAR  ONCE DAILY  . rosuvastatin (CRESTOR) 5 MG tablet Take 1 tablet (5 mg total) by mouth daily.   No facility-administered encounter medications on file as of 03/30/2020.    Review of Systems:  Review of Systems  Constitutional: Negative for chills, fever and malaise/fatigue.       Wt gain  HENT: Negative for congestion.   Eyes: Negative for blurred vision.  Respiratory: Negative for cough and shortness of breath.   Cardiovascular: Positive for leg swelling. Negative for chest pain and palpitations.  Gastrointestinal: Negative for abdominal pain.  Genitourinary: Negative for dysuria.       BPH  Musculoskeletal: Positive for back pain. Negative for falls.    Skin: Negative for itching and rash.  Neurological: Negative for dizziness and loss of consciousness.  Psychiatric/Behavioral: Negative for depression and memory loss. The patient is not nervous/anxious.     Health Maintenance  Topic Date Due  . Hepatitis C Screening  Never done  . PNA vac Low Risk Adult (2 of 2 - PPSV23) 06/05/2015  . OPHTHALMOLOGY EXAM  01/23/2019  . FOOT EXAM  07/03/2019  . INFLUENZA VACCINE  04/05/2020  . HEMOGLOBIN A1C  09/23/2020  . TETANUS/TDAP  10/17/2027  . COVID-19 Vaccine  Completed    Physical Exam: Vitals:   03/30/20 0939  BP: 126/72  Pulse: 78  Temp: (!) 97.5 F (36.4 C)  TempSrc: Temporal  SpO2: 96%  Weight: (!) 224 lb (101.6 kg)  Height: 5\' 3"  (1.6 m)   Body mass index is 39.68 kg/m. Physical Exam Vitals reviewed.  Constitutional:      General: He is not in acute distress.    Appearance: Normal appearance. He is obese. He is not toxic-appearing.  HENT:     Head: Normocephalic and atraumatic.  Cardiovascular:     Rate and Rhythm: Normal rate and regular rhythm.  Pulmonary:     Effort: Pulmonary effort is normal.     Breath sounds: Normal breath sounds. No wheezing, rhonchi or rales.  Abdominal:     General: Bowel sounds are normal.  Musculoskeletal:        General: Normal range of motion.     Right lower leg: No edema.     Left lower leg: No edema.  Skin:    General: Skin is warm and dry.  Neurological:     General: No focal deficit present.     Mental Status: He is alert and oriented to person, place, and time.  Psychiatric:        Mood and Affect: Mood normal.        Behavior: Behavior normal.    Labs reviewed: Basic Metabolic Panel: Recent Labs    07/17/19 0820 11/25/19 0926 03/23/20 0831  NA 133* 139 138  K 4.5 4.2 4.7  CL 105 107 104  CO2 22 24 24   GLUCOSE 101* 98 88  BUN 17 19 12   CREATININE 0.96 1.01 0.97  CALCIUM 9.5 8.9 9.0   Liver Function Tests: No results for input(s): AST, ALT, ALKPHOS, BILITOT,  PROT, ALBUMIN in the last 8760 hours. No results for input(s): LIPASE, AMYLASE in the last 8760 hours. No results for input(s): AMMONIA in the last 8760 hours. CBC: Recent Labs    07/17/19 0820  WBC 8.0  NEUTROABS 4,592  HGB 13.9  HCT 41.2  MCV 86.6  PLT 310   Lipid Panel: Recent Labs    07/17/19 0820 11/25/19 0926 03/23/20 0831  CHOL 221* 230* 169  HDL 48 49 59  LDLCALC 141* 146* 77  TRIG 186* 209* 253*  CHOLHDL 4.6 4.7 2.9   Lab Results  Component Value Date   HGBA1C 5.9 (H) 03/23/2020   Assessment/Plan 1. Type 2 diabetes mellitus with other specified complication, without long-term current use of insulin (HCC) - control improved - he is eating more whole grains and fiber - he's gained weight for some reason=-maybe portion sizes -Hemoglobin A1c; Future - Basic metabolic panel; Future  2. Hyperlipidemia associated with type 2 diabetes mellitus (HCC) - LDL improved, TG have gone up--seems he may have had something he shouldn't while watching his son's dogs (son had shopped for him) - Lipid panel; Future  3. Hypothyroidism, unspecified type -was wnl--cont same routine Lab Results  Component Value Date   TSH 2.30 10/29/2018    4. BMI 39.0-39.9,adult - up since last time with his comorbidities makes him morbidly obese--he is going to work on his weight more - Hemoglobin A1c; Future - Lipid panel; Future  5. Morbid obesity ([place]) -counseled on diet and exercise regimen today - Hemoglobin A1c; Future - Lipid panel; Future  6. Chronic venous insufficiency -recommended compression socks for him -he has some of the athletic variety and will try them  -he will call if he needs a Rx for the athletic variety  Labs/tests ordered:   Lab Orders     Hemoglobin A1c     Lipid panel     Basic metabolic panel  Next appt:  08/03/2020 med mgt, fasting labs before  [place] Acree L. Welma Mccombs, D.O. [facility] [address], [place] [address] Cell Phone (Mon-Fri 8am-5pm):  [phone] On Call:  [phone] & follow prompts after 5pm & weekends Office Phone:  [phone] Office Fax:  [phone]

## 2020-05-24 ENCOUNTER — Other Ambulatory Visit: Payer: Self-pay | Admitting: Family

## 2020-05-24 ENCOUNTER — Other Ambulatory Visit: Payer: Self-pay | Admitting: Internal Medicine

## 2020-05-24 DIAGNOSIS — E119 Type 2 diabetes mellitus without complications: Secondary | ICD-10-CM

## 2020-06-14 ENCOUNTER — Other Ambulatory Visit: Payer: Self-pay | Admitting: Internal Medicine

## 2020-06-14 DIAGNOSIS — I10 Essential (primary) hypertension: Secondary | ICD-10-CM

## 2020-06-29 ENCOUNTER — Telehealth: Payer: Self-pay | Admitting: *Deleted

## 2020-06-29 NOTE — Telephone Encounter (Signed)
Patient notified

## 2020-06-29 NOTE — Telephone Encounter (Signed)
We actually prescribe losartan and similar medicine for diabetic patients to protect their kidneys so that information is entirely false.

## 2020-06-29 NOTE — Telephone Encounter (Signed)
Patient called and stated that he has been reading in the newspaper and listening to the news regarding countries withdrawing the medication Losartan.  Stated that people should not be taking it and especially people with Diabetes. Stated that he has diabetes and he is concerned and wonders if he should come off of it.  Please Advise.

## 2020-07-27 ENCOUNTER — Other Ambulatory Visit: Payer: Medicare Other

## 2020-07-29 ENCOUNTER — Other Ambulatory Visit: Payer: Medicare Other

## 2020-07-29 ENCOUNTER — Other Ambulatory Visit: Payer: Self-pay

## 2020-07-29 DIAGNOSIS — E785 Hyperlipidemia, unspecified: Secondary | ICD-10-CM

## 2020-07-29 DIAGNOSIS — E1169 Type 2 diabetes mellitus with other specified complication: Secondary | ICD-10-CM | POA: Diagnosis not present

## 2020-07-29 DIAGNOSIS — Z6839 Body mass index (BMI) 39.0-39.9, adult: Secondary | ICD-10-CM

## 2020-08-03 ENCOUNTER — Other Ambulatory Visit: Payer: Self-pay

## 2020-08-03 ENCOUNTER — Ambulatory Visit (INDEPENDENT_AMBULATORY_CARE_PROVIDER_SITE_OTHER): Payer: Medicare Other | Admitting: Internal Medicine

## 2020-08-03 ENCOUNTER — Encounter: Payer: Self-pay | Admitting: Internal Medicine

## 2020-08-03 ENCOUNTER — Telehealth: Payer: Self-pay

## 2020-08-03 VITALS — BP 130/100 | HR 71 | Temp 97.5°F | Ht 63.0 in | Wt 226.0 lb

## 2020-08-03 DIAGNOSIS — E039 Hypothyroidism, unspecified: Secondary | ICD-10-CM

## 2020-08-03 DIAGNOSIS — Z1159 Encounter for screening for other viral diseases: Secondary | ICD-10-CM

## 2020-08-03 DIAGNOSIS — Z1211 Encounter for screening for malignant neoplasm of colon: Secondary | ICD-10-CM

## 2020-08-03 DIAGNOSIS — E1169 Type 2 diabetes mellitus with other specified complication: Secondary | ICD-10-CM | POA: Diagnosis not present

## 2020-08-03 DIAGNOSIS — Z23 Encounter for immunization: Secondary | ICD-10-CM | POA: Diagnosis not present

## 2020-08-03 DIAGNOSIS — E785 Hyperlipidemia, unspecified: Secondary | ICD-10-CM

## 2020-08-03 MED ORDER — ASPIRIN EC 81 MG PO TBEC: 81.0000 mg | DELAYED_RELEASE_TABLET | Freq: Every day | ORAL | 11 refills | Status: DC

## 2020-08-03 NOTE — Patient Instructions (Addendum)
Have a Merry Christmas! Please schedule your eye exam asap since you are recently "boosted" for covid and overdue for this.

## 2020-08-03 NOTE — Telephone Encounter (Signed)
Just wrote on results.  I realized after he left that we had not gone over them.

## 2020-08-03 NOTE — Progress Notes (Signed)
Electrolytes were normal.  Kidney function was down a little bit.  Hydration with water is key as we discussed. Triglycerides (starchy fats) trended up but bad cholesterol is at goal Sugar average also went up from 5.9 to 6.2 so trending in the wrong direction  I would not change his meds with these at this time, but continue to work on diet and stay active  Hepatitis c screen was added.

## 2020-08-03 NOTE — Progress Notes (Signed)
Location:  [facility] clinic Provider:  Aniqua Briere L. Mariea Clonts, D.O., C.M.D.  Goals of Care:  Advanced Directives 08/03/2020  Does Patient Have a Medical Advance Directive? Yes  Type of Advance Directive [place]  Does patient want to make changes to medical advance directive? No - Patient declined  Copy of [place] in Chart? -   Chief Complaint  Patient presents with  . Medical Management of Chronic Issues    4 month follow up visit.  Marland Kitchen Best Practice Recommendations    Hep C screening, Pneumonia vaccine, Flu vaccine  . Quality Metric Gaps    Eye exam, foot exam    HPI: Patient is a 79 y.o. male seen today for medical management of chronic diseases.    He has questions:  Cologuard?  Had prior negative cscope and then negative cologuard in 2017.  It's been just over 4 yrs.   Every now and then, he gets so tired if he does very little of anything.  Sometimes, if he blows leaves, he'll be real tired.  Not all the time.  He's never gotten tired like this before.  Discussed altered electrolytes and intake.  Sleep is not always good b/c of getting up to urinate.  May make it all night if he's really worn out.  He will be up half the night if he has a soda.  Reviewed urology note from Dr. [name].  They're continuing PSA over there until he is 82.  Takes his finasteride.    He asks about stopping baby asa--we discussed that he should take it b/c of his diabetes.    He mentions that he's taking his levothyroxine with other meds, but NOT his PPI or vitamins.    Still needs to schedule eye exam which he put off due to covid.  Has had his booster last month.    Wt up 2 lbs.  Past Medical History:  Diagnosis Date  . Back pain, lumbosacral 2014  . Cancer of kidney ([place]) 2012  . Cataract 2013  . Diabetes mellitus without complication ([place]) 2376  . Hypertension 2012  . Nephrolithiasis 2012  . Obesity, unspecified   . Other and unspecified hyperlipidemia  04/03/2013  . Pain, joint, ankle and foot   . Unspecified hypothyroidism 2012    Past Surgical History:  Procedure Laterality Date  . CATARACT EXTRACTION W/ INTRAOCULAR LENS IMPLANT Bilateral 03/05/2016   Dr. [name]  . CYST REMOVAL TRUNK  11/03/2017  . KIDNEY SURGERY  12/2010   had cancer removed; Dr. Alinda Money  . ROTATOR CUFF REPAIR     bilateral; Dr. [name]    No Known Allergies  Outpatient Encounter Medications as of 08/03/2020  Medication Sig  . Ascorbic Acid (VITAMIN C) 1000 MG tablet Take 1,000 mg by mouth daily.  . Cholecalciferol (VITAMIN D3) 50 MCG (2000 UT) TABS Take 1 tablet by mouth daily.  . Cyanocobalamin (VITAMIN B 12 PO) Take 1,000 mcg by mouth.  . finasteride (PROSCAR) 5 MG tablet Take 5 mg by mouth daily.  Marland Kitchen glucose blood (ONETOUCH ULTRA) test strip Test blood sugar once daily E11.69  . levothyroxine (SYNTHROID) 50 MCG tablet TAKE 1 TABLET BY MOUTH  DAILY BEFORE BREAKFAST  . losartan (COZAAR) 50 MG tablet TAKE 1 TABLET BY MOUTH  DAILY  . Magnesium 500 MG CAPS Take 1 capsule by mouth daily.  . metFORMIN (GLUCOPHAGE) 500 MG tablet TAKE ONE TABLET BY MOUTH  TWICE DAILY TO CONTROL  DIABETES  .  Multiple Vitamins-Minerals (MULTIVITAMIN WITH MINERALS) tablet Take 1 tablet by mouth daily.  . Omega-3 Fatty Acids (OMEGA 3 500) 500 MG CAPS Take 1 capsule by mouth daily.  Marland Kitchen omeprazole (PRILOSEC) 20 MG capsule Take 1 capsule (20 mg total) by mouth daily.  Glory Rosebush DELICA LANCETS FINE MISC USE TO CHECK BLOOD SUGAR ONCE DAILY  . rosuvastatin (CRESTOR) 5 MG tablet Take 1 tablet (5 mg total) by mouth daily.  . [DISCONTINUED] aspirin 81 MG tablet One daily to revent heart attack or stroke  . [DISCONTINUED] Cholecalciferol (VITAMIN D-3 PO) Take 1 tablet by mouth daily.    No facility-administered encounter medications on file as of 08/03/2020.    Review of Systems:  Review of Systems  Constitutional: Negative for chills, fever and malaise/fatigue.  HENT: Negative for congestion  and sore throat.   Eyes: Negative for blurred vision.  Respiratory: Negative for cough and shortness of breath.   Cardiovascular: Negative for chest pain, palpitations and leg swelling.  Gastrointestinal: Negative for abdominal pain, blood in stool, constipation, diarrhea and melena.  Genitourinary: Negative for dysuria.  Musculoskeletal: Negative for falls and joint pain.  Skin: Negative for itching and rash.  Neurological: Negative for dizziness and loss of consciousness.       Diabetic foot exam was performed with the following findings:   No deformities, ulcerations, or other skin breakdown Normal sensation of 10g monofilament Intact posterior tibialis and dorsalis pedis pulses     Endo/Heme/Allergies: Bruises/bleeds easily.  Psychiatric/Behavioral: Negative for depression and memory loss. The patient is not nervous/anxious and does not have insomnia.     Health Maintenance  Topic Date Due  . Hepatitis C Screening  Never done  . PNA vac Low Risk Adult (2 of 2 - PPSV23) 06/05/2015  . OPHTHALMOLOGY EXAM  01/23/2019  . FOOT EXAM  07/03/2019  . INFLUENZA VACCINE  04/05/2020  . HEMOGLOBIN A1C  01/26/2021  . TETANUS/TDAP  10/17/2027  . COVID-19 Vaccine  Completed    Physical Exam: Vitals:   08/03/20 0933  BP: (!) 130/100  Pulse: 71  Temp: (!) 97.5 F (36.4 C)  SpO2: 98%  Weight: 226 lb (102.5 kg)  Height: 5\' 3"  (1.6 m)   Body mass index is 40.03 kg/m. Physical Exam Vitals reviewed.  Constitutional:      General: He is not in acute distress.    Appearance: Normal appearance. He is not toxic-appearing.  HENT:     Head: Normocephalic and atraumatic.  Eyes:     Conjunctiva/sclera: Conjunctivae normal.     Pupils: Pupils are equal, round, and reactive to light.  Cardiovascular:     Rate and Rhythm: Normal rate and regular rhythm.     Pulses: Normal pulses.     Heart sounds: Normal heart sounds.  Pulmonary:     Effort: Pulmonary effort is normal.     Breath sounds:  Normal breath sounds. No wheezing, rhonchi or rales.  Abdominal:     General: Bowel sounds are normal. There is no distension.  Musculoskeletal:        General: Normal range of motion.     Cervical back: Neck supple.     Right lower leg: No edema.     Left lower leg: No edema.  Skin:    General: Skin is warm and dry.  Neurological:     General: No focal deficit present.     Mental Status: He is alert and oriented to person, place, and time. Mental status is at baseline.  Motor: No weakness.     Gait: Gait normal.  Psychiatric:        Mood and Affect: Mood normal.        Behavior: Behavior normal.     Labs reviewed: Basic Metabolic Panel: Recent Labs    11/25/19 0926 03/23/20 0831 07/29/20 0856  NA 139 138 138  K 4.2 4.7 4.4  CL 107 104 105  CO2 24 24 22   GLUCOSE 98 88 108*  BUN 19 12 14   CREATININE 1.01 0.97 1.14  CALCIUM 8.9 9.0 9.4   Liver Function Tests: No results for input(s): AST, ALT, ALKPHOS, BILITOT, PROT, ALBUMIN in the last 8760 hours. No results for input(s): LIPASE, AMYLASE in the last 8760 hours. No results for input(s): AMMONIA in the last 8760 hours. CBC: No results for input(s): WBC, NEUTROABS, HGB, HCT, MCV, PLT in the last 8760 hours. Lipid Panel: Recent Labs    11/25/19 0926 03/23/20 0831 07/29/20 0856  CHOL 230* 169 146  HDL 49 59 44  LDLCALC 146* 77 68  TRIG 209* 253* 260*  CHOLHDL 4.7 2.9 3.3   Lab Results  Component Value Date   HGBA1C 6.2 (H) 07/29/2020    Assessment/Plan 1. Type 2 diabetes mellitus with other specified complication, without long-term current use of insulin (HCC) - has been well controlled -cont current regimen and restart asa due to he is high risk - aspirin EC 81 MG tablet; Take 1 tablet (81 mg total) by mouth daily. Swallow whole.  Dispense: 30 tablet; Refill: 11 - CBC with Differential/Platelet; Future - COMPLETE METABOLIC PANEL WITH GFR; Future - Lipid panel; Future - Hemoglobin A1c; Future  2.  Hyperlipidemia associated with type 2 diabetes mellitus (HCC) - cont current therapy and dietary changes, walking for exercise - aspirin EC 81 MG tablet; Take 1 tablet (81 mg total) by mouth daily. Swallow whole.  Dispense: 30 tablet; Refill: 11 - CBC with Differential/Platelet; Future - COMPLETE METABOLIC PANEL WITH GFR; Future - Lipid panel; Future  3. Hypothyroidism, unspecified type - cont levothyroxine, due for recheck of tsh especially in view of taking with other meds besides his vitamins and ppi - TSH; Future  4. Encounter for hepatitis C screening test for low risk patient - Hepatitis C antibody--try to add on to previously drawn labs from 11/24  5. Colon cancer screening -requests to have cologuard again  -discussed that he is still active, functional and he doesn't know if he'd pursue tx if colon cancer found, but wants to cross that bridge when we come to it  6. Need for 23-polyvalent pneumococcal polysaccharide vaccine -last dose was in his 50s so needs to get once more now  Labs/tests ordered:   Orders Placed This Encounter  Procedures  . Hepatitis C antibody  . CBC with Differential/Platelet    Standing Status:   Future    Standing Expiration Date:   08/03/2021  . COMPLETE METABOLIC PANEL WITH GFR    Standing Status:   Future    Standing Expiration Date:   08/03/2021  . Lipid panel    Standing Status:   Future    Standing Expiration Date:   08/03/2021    Order Specific Question:   Has the patient fasted?    Answer:   Yes  . Hemoglobin A1c    Standing Status:   Future    Standing Expiration Date:   08/03/2021    Order Specific Question:   Release to patient    Answer:   Immediate  .  TSH    Standing Status:   Future    Standing Expiration Date:   08/03/2021    Next appt:  4 mos med mgt, fasting labs before   Allen Egerton L. Cap Massi, D.O. [facility] [address], [place] [address] Cell Phone (Mon-Fri  8am-5pm):  [phone] On Call:  [phone] & follow prompts after 5pm & weekends Office Phone:  [phone] Office Fax:  [phone]

## 2020-08-03 NOTE — Telephone Encounter (Signed)
Patient called. He stated he was seen by Dr. Mariea Clonts today and no lab results were discussed with him and there were no results on his AVS. I told him that we were off for 2 days last week and we were trying to play catch up. I told him that someone would call him once they had been resulted, that it would probably be tomorrow, but that someone would call him.

## 2020-08-04 LAB — LIPID PANEL
Cholesterol: 146 mg/dL (ref <200)
HDL: 44 mg/dL (ref >=40)
LDL Cholesterol (Calc): 68 mg/dL (calc)
Non-HDL Cholesterol (Calc): 102 mg/dL (calc) (ref <130)
Total CHOL/HDL Ratio: 3.3 (calc) (ref <5.0)
Triglycerides: 260 mg/dL — ABNORMAL HIGH (ref <150)

## 2020-08-04 LAB — BASIC METABOLIC PANEL
BUN: 14 mg/dL (ref 7–25)
CO2: 22 mmol/L (ref 20–32)
Calcium: 9.4 mg/dL (ref 8.6–10.3)
Chloride: 105 mmol/L (ref 98–110)
Creat: 1.14 mg/dL (ref 0.70–1.18)
Glucose, Bld: 108 mg/dL — ABNORMAL HIGH (ref 65–99)
Potassium: 4.4 mmol/L (ref 3.5–5.3)
Sodium: 138 mmol/L (ref 135–146)

## 2020-08-04 LAB — HEPATITIS C ANTIBODY
Hepatitis C Ab: NONREACTIVE
SIGNAL TO CUT-OFF: 0.01 (ref <1.00)

## 2020-08-04 LAB — TEST AUTHORIZATION

## 2020-08-04 LAB — HEMOGLOBIN A1C
Hgb A1c MFr Bld: 6.2 % of total Hgb — ABNORMAL HIGH (ref <5.7)
Mean Plasma Glucose: 131 (calc)
eAG (mmol/L): 7.3 (calc)

## 2020-08-04 NOTE — Progress Notes (Signed)
Hepatitis C screen is negative.

## 2020-08-04 NOTE — Telephone Encounter (Signed)
Patient called with results.

## 2020-08-17 DIAGNOSIS — Z961 Presence of intraocular lens: Secondary | ICD-10-CM | POA: Diagnosis not present

## 2020-08-17 DIAGNOSIS — H26493 Other secondary cataract, bilateral: Secondary | ICD-10-CM | POA: Diagnosis not present

## 2020-08-17 DIAGNOSIS — H348322 Tributary (branch) retinal vein occlusion, left eye, stable: Secondary | ICD-10-CM | POA: Diagnosis not present

## 2020-08-17 LAB — HM DIABETES EYE EXAM

## 2020-08-18 ENCOUNTER — Other Ambulatory Visit: Payer: Self-pay | Admitting: Internal Medicine

## 2020-09-02 ENCOUNTER — Other Ambulatory Visit: Payer: Self-pay | Admitting: Internal Medicine

## 2020-09-02 DIAGNOSIS — E1169 Type 2 diabetes mellitus with other specified complication: Secondary | ICD-10-CM

## 2020-09-02 DIAGNOSIS — E785 Hyperlipidemia, unspecified: Secondary | ICD-10-CM

## 2020-09-18 ENCOUNTER — Other Ambulatory Visit: Payer: Self-pay | Admitting: Internal Medicine

## 2020-09-18 DIAGNOSIS — E1169 Type 2 diabetes mellitus with other specified complication: Secondary | ICD-10-CM

## 2020-09-23 ENCOUNTER — Telehealth: Payer: Self-pay | Admitting: *Deleted

## 2020-09-23 DIAGNOSIS — M1030 Gout due to renal impairment, unspecified site: Secondary | ICD-10-CM

## 2020-09-23 DIAGNOSIS — M1A30X0 Chronic gout due to renal impairment, unspecified site, without tophus (tophi): Secondary | ICD-10-CM

## 2020-09-23 MED ORDER — COLCHICINE 0.6 MG PO TABS
ORAL_TABLET | ORAL | 1 refills | Status: DC
Start: 2020-09-23 — End: 2021-04-21

## 2020-09-23 MED ORDER — ALLOPURINOL 100 MG PO TABS
ORAL_TABLET | ORAL | 3 refills | Status: DC
Start: 2020-09-23 — End: 2021-04-21

## 2020-09-23 NOTE — Telephone Encounter (Signed)
Patient called and stated that he is having a gout attack and requesting his Colchicine 0.6 Two today then One daily until pain,redness and swelling resolved. Patient is in [place].  Medication is not in Current medication list but his history. Is it ok to add and send to pharmacy.  Please Advise.

## 2020-09-23 NOTE — Telephone Encounter (Signed)
Ok to fill colchicine as requested with #30 and a refill.  Please also let him know that he needs to resume allopurinol 100mg  daily--give 90 with 3 refills to take ongoing to prevent these flare-ups.

## 2020-09-23 NOTE — Telephone Encounter (Signed)
Patient notified. LMOM with Dr. Cyndi Lennert directions.  Medication list updated and pended Rx's and sent to Dr. Mariea Clonts for approval due to [place].

## 2020-10-26 ENCOUNTER — Encounter: Payer: Self-pay | Admitting: Internal Medicine

## 2020-11-26 ENCOUNTER — Other Ambulatory Visit: Payer: Self-pay

## 2020-11-26 ENCOUNTER — Encounter: Payer: Self-pay | Admitting: Family Medicine

## 2020-11-26 ENCOUNTER — Ambulatory Visit (INDEPENDENT_AMBULATORY_CARE_PROVIDER_SITE_OTHER): Payer: Medicare Other | Admitting: Family Medicine

## 2020-11-26 ENCOUNTER — Other Ambulatory Visit: Payer: Medicare Other

## 2020-11-26 VITALS — BP 132/80 | HR 65 | Temp 96.6°F | Ht 63.0 in | Wt 224.6 lb

## 2020-11-26 DIAGNOSIS — E785 Hyperlipidemia, unspecified: Secondary | ICD-10-CM

## 2020-11-26 DIAGNOSIS — I10 Essential (primary) hypertension: Secondary | ICD-10-CM | POA: Diagnosis not present

## 2020-11-26 DIAGNOSIS — E039 Hypothyroidism, unspecified: Secondary | ICD-10-CM | POA: Diagnosis not present

## 2020-11-26 DIAGNOSIS — E1169 Type 2 diabetes mellitus with other specified complication: Secondary | ICD-10-CM | POA: Diagnosis not present

## 2020-11-26 NOTE — Progress Notes (Signed)
Location:      Place of Service:   [employer] care office  Provider: [name], MD  Code Status: full Goals of Care:  Advanced Directives 11/26/2020  Does Patient Have a Medical Advance Directive? Yes  Type of Paramedic of [place];Living will  Does patient want to make changes to medical advance directive? No - Patient declined  Copy of [place] in Chart? Yes - validated most recent copy scanned in chart (See row information)     Chief Complaint  Patient presents with  . Medical Management of Chronic Issues    4 month follow-up and discuss need for eye exam     HPI: Patient is a 80 y.o. male seen today for medical management of chronic diseases.  Chronic problems include type 2 diabetes without use of insulin, hyperlipidemia associated with type 2 diabetes, hypothyroidism.  He does not have specific complaints today.  He is still followed for prostate issues.  Past history of renal cancer with surgical removal. He has had gout twice in the last 6 months.  Colchicine was effective but he did not continue allopurinol after acute attack subsided. Most recent A1c was 6.2 on Metformin still needs eye exam that was not completed during Covid.   Past Medical History:  Diagnosis Date  . Back pain, lumbosacral 2014  . Cancer of kidney ([place]) 2012  . Cataract 2013  . Diabetes mellitus without complication ([place]) 9628  . Hypertension 2012  . Nephrolithiasis 2012  . Obesity, unspecified   . Other and unspecified hyperlipidemia 04/03/2013  . Pain, joint, ankle and foot   . Unspecified hypothyroidism 2012    Past Surgical History:  Procedure Laterality Date  . CATARACT EXTRACTION W/ INTRAOCULAR LENS IMPLANT Bilateral 03/05/2016   Dr. [name]  . CYST REMOVAL TRUNK  11/03/2017  . KIDNEY SURGERY  12/2010   had cancer removed; Dr. Alinda Money  . ROTATOR CUFF REPAIR     bilateral; Dr. [name]    No Known Allergies  Outpatient Encounter  Medications as of 11/26/2020  Medication Sig  . allopurinol (ZYLOPRIM) 100 MG tablet Take one tablet by mouth once daily to prevent gout flare ups.  . Ascorbic Acid (VITAMIN C) 1000 MG tablet Take 1,000 mg by mouth daily.  Marland Kitchen aspirin EC 81 MG tablet Take 1 tablet (81 mg total) by mouth daily. Swallow whole.  . Cholecalciferol (VITAMIN D3) 50 MCG (2000 UT) TABS Take 1 tablet by mouth daily.  . colchicine 0.6 MG tablet Take two tablets today, then one daily until pain, redness and swelling has resolved.  . Cyanocobalamin (VITAMIN B 12 PO) Take 1,000 mcg by mouth.  . finasteride (PROSCAR) 5 MG tablet Take 5 mg by mouth daily.  Marland Kitchen glucose blood (ONETOUCH ULTRA) test strip Test blood sugar once daily E11.69  . levothyroxine (SYNTHROID) 50 MCG tablet TAKE 1 TABLET BY MOUTH  DAILY BEFORE BREAKFAST  . losartan (COZAAR) 50 MG tablet TAKE 1 TABLET BY MOUTH  DAILY  . Magnesium 500 MG CAPS Take 1 capsule by mouth daily.  . metFORMIN (GLUCOPHAGE) 500 MG tablet TAKE ONE TABLET BY MOUTH  TWICE DAILY TO CONTROL  DIABETES  . Multiple Vitamins-Minerals (MULTIVITAMIN WITH MINERALS) tablet Take 1 tablet by mouth daily.  . Omega-3 Fatty Acids (OMEGA 3 500) 500 MG CAPS Take 1 capsule by mouth daily.  Marland Kitchen omeprazole (PRILOSEC) 20 MG capsule TAKE 1 CAPSULE BY MOUTH  DAILY  . ONETOUCH DELICA LANCETS FINE MISC USE TO  CHECK BLOOD SUGAR ONCE DAILY  . rosuvastatin (CRESTOR) 5 MG tablet TAKE 1 TABLET BY MOUTH  DAILY   No facility-administered encounter medications on file as of 11/26/2020.    Review of Systems:  Review of Systems  Constitutional: Negative.   HENT: Negative.   Eyes: Negative.   Respiratory: Negative.   Cardiovascular: Negative.   Musculoskeletal: Negative.   All other systems reviewed and are negative.   Health Maintenance  Topic Date Due  . OPHTHALMOLOGY EXAM  01/23/2019  . COVID-19 Vaccine (4 - Booster for Pfizer series) 12/13/2020  . HEMOGLOBIN A1C  01/26/2021  . FOOT EXAM  08/03/2021  .  TETANUS/TDAP  10/17/2027  . INFLUENZA VACCINE  Completed  . Hepatitis C Screening  Completed  . PNA vac Low Risk Adult  Completed  . HPV VACCINES  Aged Out    Physical Exam: Vitals:   11/26/20 0826  BP: 132/80  Pulse: 65  Temp: (!) 96.6 F (35.9 C)  TempSrc: Temporal  SpO2: 98%  Weight: 224 lb 9.6 oz (101.9 kg)  Height: 5\' 3"  (1.6 m)   Body mass index is 39.79 kg/m. Physical Exam Vitals and nursing note reviewed.  Constitutional:      Appearance: Normal appearance.  HENT:     Head: Normocephalic.     Right Ear: Tympanic membrane normal.     Left Ear: Tympanic membrane normal.     Nose: Nose normal.     Mouth/Throat:     Mouth: Mucous membranes are moist.  Eyes:     Pupils: Pupils are equal, round, and reactive to light.  Cardiovascular:     Rate and Rhythm: Normal rate and regular rhythm.     Pulses: Normal pulses.  Pulmonary:     Effort: Pulmonary effort is normal.     Breath sounds: Normal breath sounds.  Abdominal:     General: Abdomen is flat.     Palpations: Abdomen is soft.  Genitourinary:    Comments: Followed by [facility] Musculoskeletal:        General: Normal range of motion.  Skin:    General: Skin is warm and dry.  Neurological:     General: No focal deficit present.     Mental Status: He is alert and oriented to person, place, and time.     Labs reviewed: Basic Metabolic Panel: Recent Labs    03/23/20 0831 07/29/20 0856  NA 138 138  K 4.7 4.4  CL 104 105  CO2 24 22  GLUCOSE 88 108*  BUN 12 14  CREATININE 0.97 1.14  CALCIUM 9.0 9.4   Liver Function Tests: No results for input(s): AST, ALT, ALKPHOS, BILITOT, PROT, ALBUMIN in the last 8760 hours. No results for input(s): LIPASE, AMYLASE in the last 8760 hours. No results for input(s): AMMONIA in the last 8760 hours. CBC: No results for input(s): WBC, NEUTROABS, HGB, HCT, MCV, PLT in the last 8760 hours. Lipid Panel: Recent Labs    03/23/20 0831 07/29/20 0856  CHOL 169  146  HDL 59 44  LDLCALC 77 68  TRIG 253* 260*  CHOLHDL 2.9 3.3   Lab Results  Component Value Date   HGBA1C 6.2 (H) 07/29/2020    Procedures since last visit: No results found.  Assessment/Plan . 1. Primary hypertension Blood pressure today is good at 132/80.  He takes losartan 1 tablet daily we will continue same  2. Hypothyroidism, unspecified type TSH was last checked about 2 years ago and was within normal limits.  He continues to take thyroid supplement.  3. Type 2 diabetes mellitus with other specified complication, without long-term current use of insulin (HCC) Most recent A1c about 4 months ago was 6.2 on Metformin.  Will repeat A1c today  Labs/tests ordered:  CMP, lipid A1C Next appt:  02/25/2021

## 2020-11-26 NOTE — Patient Instructions (Signed)
Keep up the good work, diet, weight loss

## 2020-11-27 LAB — LIPID PANEL
Cholesterol: 129 mg/dL (ref <200)
HDL: 44 mg/dL (ref >=40)
LDL Cholesterol (Calc): 63 mg/dL (calc)
Non-HDL Cholesterol (Calc): 85 mg/dL (calc) (ref <130)
Total CHOL/HDL Ratio: 2.9 (calc) (ref <5.0)
Triglycerides: 134 mg/dL (ref <150)

## 2020-11-27 LAB — COMPLETE METABOLIC PANEL WITH GFR
AG Ratio: 1.8 (calc) (ref 1.0–2.5)
ALT: 13 U/L (ref 9–46)
AST: 17 U/L (ref 10–35)
Albumin: 3.9 g/dL (ref 3.6–5.1)
Alkaline phosphatase (APISO): 74 U/L (ref 35–144)
BUN: 15 mg/dL (ref 7–25)
CO2: 21 mmol/L (ref 20–32)
Calcium: 9.4 mg/dL (ref 8.6–10.3)
Chloride: 104 mmol/L (ref 98–110)
Creat: 1 mg/dL (ref 0.70–1.18)
GFR, Est African American: 84 mL/min/{1.73_m2} (ref >=60)
GFR, Est Non African American: 72 mL/min/{1.73_m2} (ref >=60)
Globulin: 2.2 g/dL (calc) (ref 1.9–3.7)
Glucose, Bld: 102 mg/dL — ABNORMAL HIGH (ref 65–99)
Potassium: 4.3 mmol/L (ref 3.5–5.3)
Sodium: 138 mmol/L (ref 135–146)
Total Bilirubin: 0.4 mg/dL (ref 0.2–1.2)
Total Protein: 6.1 g/dL (ref 6.1–8.1)

## 2020-11-27 LAB — CBC WITH DIFFERENTIAL/PLATELET
Absolute Monocytes: 664 cells/uL (ref 200–950)
Basophils Absolute: 103 cells/uL (ref 0–200)
Basophils Relative: 1.3 %
Eosinophils Absolute: 411 cells/uL (ref 15–500)
Eosinophils Relative: 5.2 %
HCT: 42.1 % (ref 38.5–50.0)
Hemoglobin: 13.9 g/dL (ref 13.2–17.1)
Lymphs Abs: 2117 cells/uL (ref 850–3900)
MCH: 28.7 pg (ref 27.0–33.0)
MCHC: 33 g/dL (ref 32.0–36.0)
MCV: 86.8 fL (ref 80.0–100.0)
MPV: 11 fL (ref 7.5–12.5)
Monocytes Relative: 8.4 %
Neutro Abs: 4606 cells/uL (ref 1500–7800)
Neutrophils Relative %: 58.3 %
Platelets: 266 10*3/uL (ref 140–400)
RBC: 4.85 10*6/uL (ref 4.20–5.80)
RDW: 14 % (ref 11.0–15.0)
Total Lymphocyte: 26.8 %
WBC: 7.9 10*3/uL (ref 3.8–10.8)

## 2020-11-27 LAB — HEMOGLOBIN A1C
Hgb A1c MFr Bld: 6 % of total Hgb — ABNORMAL HIGH (ref <5.7)
Mean Plasma Glucose: 126 mg/dL
eAG (mmol/L): 7 mmol/L

## 2020-11-27 LAB — TSH: TSH: 1.42 mIU/L (ref 0.40–4.50)

## 2020-11-30 ENCOUNTER — Ambulatory Visit: Payer: Medicare Other | Admitting: Internal Medicine

## 2020-12-03 ENCOUNTER — Ambulatory Visit: Payer: Medicare Other | Admitting: Internal Medicine

## 2021-02-22 ENCOUNTER — Other Ambulatory Visit: Payer: Self-pay | Admitting: *Deleted

## 2021-02-22 DIAGNOSIS — E039 Hypothyroidism, unspecified: Secondary | ICD-10-CM

## 2021-02-22 MED ORDER — LEVOTHYROXINE SODIUM 50 MCG PO TABS: 50.0000 ug | ORAL_TABLET | Freq: Every day | ORAL | 3 refills | Status: DC

## 2021-02-22 NOTE — Telephone Encounter (Signed)
[facility] Requested refill.

## 2021-02-25 ENCOUNTER — Encounter: Payer: Self-pay | Admitting: Nurse Practitioner

## 2021-02-25 ENCOUNTER — Telehealth: Payer: Self-pay

## 2021-02-25 ENCOUNTER — Other Ambulatory Visit: Payer: Self-pay

## 2021-02-25 ENCOUNTER — Ambulatory Visit (INDEPENDENT_AMBULATORY_CARE_PROVIDER_SITE_OTHER): Payer: Medicare Other | Admitting: Nurse Practitioner

## 2021-02-25 DIAGNOSIS — Z0000 Encounter for general adult medical examination without abnormal findings: Secondary | ICD-10-CM

## 2021-02-25 NOTE — Progress Notes (Signed)
Subjective:   Nicholas Rivera is a 80 y.o. male who presents for Medicare Annual/Subsequent preventive examination.  Review of Systems     Cardiac Risk Factors include: advanced age (>15men, >57 women);obesity (BMI >30kg/m2);hypertension;dyslipidemia;diabetes mellitus     Objective:    There were no vitals filed for this visit. There is no height or weight on file to calculate BMI.  Advanced Directives 02/25/2021 11/26/2020 08/03/2020 03/30/2020 02/25/2020 12/02/2019 07/22/2019  Does Patient Have a Medical Advance Directive? Yes Yes Yes Yes Yes Yes Yes  Type of Paramedic of Nicholas Rivera;Living will Nicholas Rivera;Living will Healthcare Power of Nicholas Rivera of Nicholas Rivera of Nicholas Rivera -  Does patient want to make changes to medical advance directive? No - Patient declined No - Patient declined No - Patient declined No - Patient declined No - Patient declined No - Patient declined -  Copy of Nicholas Rivera in Chart? Yes - validated most recent copy scanned in chart (See row information) Yes - validated most recent copy scanned in chart (See row information) - - Yes - validated most recent copy scanned in chart (See row information) Yes - validated most recent copy scanned in chart (See row information) -    Current Medications (verified) Outpatient Encounter Medications as of 02/25/2021  Medication Sig   allopurinol (ZYLOPRIM) 100 MG tablet Take one tablet by mouth once daily to prevent gout flare ups.   Ascorbic Acid (VITAMIN C) 1000 MG tablet Take 1,000 mg by mouth daily.   aspirin EC 81 MG tablet Take 1 tablet (81 mg total) by mouth daily. Swallow whole.   Cholecalciferol (VITAMIN D3) 50 MCG (2000 UT) TABS Take 1 tablet by mouth daily.   colchicine 0.6 MG tablet Take two tablets today, then one daily until pain, redness and swelling has resolved.   Cyanocobalamin (VITAMIN B 12 PO) Take  1,000 mcg by mouth.   finasteride (PROSCAR) 5 MG tablet Take 5 mg by mouth daily.   glucose blood (ONETOUCH ULTRA) test strip Test blood sugar once daily E11.69   levothyroxine (SYNTHROID) 50 MCG tablet Take 1 tablet (50 mcg total) by mouth daily before breakfast.   losartan (COZAAR) 50 MG tablet TAKE 1 TABLET BY MOUTH  DAILY   Magnesium 500 MG CAPS Take 1 capsule by mouth daily.   metFORMIN (GLUCOPHAGE) 500 MG tablet TAKE ONE TABLET BY MOUTH  TWICE DAILY TO CONTROL  DIABETES   Multiple Vitamins-Minerals (MULTIVITAMIN WITH MINERALS) tablet Take 1 tablet by mouth daily.   Omega-3 Fatty Acids (OMEGA 3 500) 500 MG CAPS Take 1 capsule by mouth daily.   omeprazole (PRILOSEC) 20 MG capsule TAKE 1 CAPSULE BY MOUTH  DAILY   ONETOUCH DELICA LANCETS FINE MISC USE TO CHECK BLOOD SUGAR ONCE DAILY   rosuvastatin (CRESTOR) 5 MG tablet TAKE 1 TABLET BY MOUTH  DAILY   No facility-administered encounter medications on file as of 02/25/2021.    Allergies (verified) Patient has no known allergies.   History: Past Medical History:  Diagnosis Date   Back pain, lumbosacral 2014   Cancer of kidney ([place]) 2012   Cataract 2013   Diabetes mellitus without complication ([place]) 0932   Hypertension 2012   Nephrolithiasis 2012   Obesity, unspecified    Other and unspecified hyperlipidemia 04/03/2013   Pain, joint, ankle and foot    Unspecified hypothyroidism 2012   Past Surgical History:  Procedure Laterality Date   CATARACT EXTRACTION W/  INTRAOCULAR LENS IMPLANT Bilateral 03/05/2016   Dr. [name]   CYST REMOVAL TRUNK  11/03/2017   KIDNEY SURGERY  12/2010   had cancer removed; Dr. [name] CUFF REPAIR     bilateral; Dr. [name]   History reviewed. No pertinent family history. Social History   Socioeconomic History   Marital status: Married    Spouse name: Not on file   Number of children: Not on file   Years of education: Not on file   Highest education level: Not on file  Occupational History    Occupation: Retired  Tobacco Use   Smoking status: Never   Smokeless tobacco: Never  Vaping Use   Vaping Use: Never used  Substance and Sexual Activity   Alcohol use: No   Drug use: No   Sexual activity: Yes  Other Topics Concern   Not on file  Social History Narrative   Patient was previously married. His wife has died from cancer.   Remarried to [employer]   Never smoked   Alcohol none   Exercise 30 minutes daily   POA   Social Determinants of Health   Financial Resource Strain: Not on file  Food Insecurity: Not on file  Transportation Needs: Not on file  Physical Activity: Not on file  Stress: Not on file  Social Connections: Not on file    Tobacco Counseling Counseling given: Not Answered   Clinical Intake:  Pre-visit preparation completed: Yes  Pain : No/denies pain     BMI - recorded: 39 Nutritional Status: BMI > 30  Obese Nutritional Risks: None Diabetes: Yes  How often do you need to have someone help you when you read instructions, pamphlets, or other written materials from your doctor or pharmacy?: 1 - Never  Diabetic? Yes         Activities of Daily Living In your present state of health, do you have any difficulty performing the following activities: 02/25/2021  Hearing? N  Vision? Y  Difficulty concentrating or making decisions? N  Walking or climbing stairs? N  Dressing or bathing? N  Doing errands, shopping? N  Preparing Food and eating ? N  Using the Toilet? N  In the past six months, have you accidently leaked urine? N  Do you have problems with loss of bowel control? N  Managing your Medications? N  Managing your Finances? N  Housekeeping or managing your Housekeeping? N  Some recent data might be hidden    Patient Care Team: [name], MD as PCP - General (Family Medicine) [name], MD as Consulting Physician (Urology) [name], MD as Consulting Physician (Ophthalmology) [name], MD as Consulting  Physician (Ophthalmology)  Indicate any recent Medical Services you may have received from other than [facility] providers in the past year (date may be approximate).     Assessment:   This is a routine wellness examination for Nicholas Rivera.  Hearing/Vision screen Hearing Screening - Comments:: No hearing issues  Vision Screening - Comments:: Last eye exam greater than 12 months ago. Dr.Glenn  Dietary issues and exercise activities discussed: Time (Minutes): 30, Frequency (Times/Week): 5, Weekly Exercise (Minutes/Week): 150   Goals Addressed   None    Depression Screen PHQ 2/9 Scores 02/25/2021 11/26/2020 02/25/2020 08/23/2019 07/22/2019 06/12/2019 03/18/2019  PHQ - 2 Score 0 0 0 0 0 0 0    Fall Risk Fall Risk  02/25/2021 11/26/2020 03/30/2020 02/25/2020 12/02/2019  Falls in the past year? 1 0 0 1 0  Number  falls in past yr: 0 0 0 0 0  Injury with Fall? 0 0 0 0 0  Risk for fall due to : Impaired balance/gait - - - -  Follow up Falls evaluation completed - - - -    FALL RISK PREVENTION PERTAINING TO THE HOME:  Any stairs in or around the home? Yes  If so, are there any without handrails? No  Home free of loose throw rugs in walkways, pet beds, electrical cords, etc? No  Adequate lighting in your home to reduce risk of falls? Yes   ASSISTIVE DEVICES UTILIZED TO PREVENT FALLS:  Life alert? No  Use of a cane, walker or w/c? No  Grab bars in the bathroom? No  Shower chair or bench in shower? No  Elevated toilet seat or a handicapped toilet? No   TIMED UP AND GO:  Was the test performed? No .    Cognitive Function: MMSE - Mini Mental State Exam 02/12/2018 01/06/2017 04/05/2016 06/04/2014  Orientation to time 5 5 5 5   Orientation to Place 5 5 5 5   Registration 3 3 3 3   Attention/ Calculation 4 5 5 5   Recall 2 3 2 3   Language- name 2 objects 2 2 2 2   Language- repeat 1 1 1 1   Language- follow 3 step command 3 3 3 3   Language- read & follow direction 1 1 1 1   Write a sentence 1 1 1 1    Copy design 1 1 1 1   Total score 28 30 29 30      6CIT Screen 02/25/2021 02/25/2020 02/15/2019  What Year? 0 points 0 points 0 points  What month? 0 points 0 points 0 points  What time? 0 points 0 points 0 points  Count back from 20 0 points 0 points 0 points  Months in reverse 0 points 0 points 0 points  Repeat phrase 0 points 0 points 0 points  Total Score 0 0 0    Immunizations Immunization History  Administered Date(s) Administered   Influenza, High Dose Seasonal PF 06/20/2017, 06/02/2018, 04/30/2019, 06/28/2020   Influenza,inj,Quad PF,6+ Mos 05/13/2015   Influenza-Unspecified 06/05/2013, 06/20/2014, 05/30/2016   PFIZER(Purple Top)SARS-COV-2 Vaccination 09/22/2019, 10/13/2019, 06/14/2020   Pneumococcal Conjugate-13 06/04/2014   Pneumococcal Polysaccharide-23 09/05/1998, 08/03/2020   Td 09/06/2003, 10/16/2017    TDAP status: Up to date  Flu Vaccine status: Up to date  Pneumococcal vaccine status: Up to date  Covid-19 vaccine status: Completed vaccines  Qualifies for Shingles Vaccine? Yes   Zostavax completed No   Shingrix Completed?: No.    Education has been provided regarding the importance of this vaccine. Patient has been advised to call insurance company to determine out of pocket expense if they have not yet received this vaccine. Advised may also receive vaccine at local pharmacy or Health Dept. Verbalized acceptance and understanding.  Screening Tests Health Maintenance  Topic Date Due   Zoster Vaccines- Shingrix (1 of 2) Never done   OPHTHALMOLOGY EXAM  01/23/2019   COVID-19 Vaccine (4 - Booster for Pfizer series) 09/14/2020   INFLUENZA VACCINE  04/05/2021   HEMOGLOBIN A1C  05/29/2021   FOOT EXAM  11/26/2021   TETANUS/TDAP  10/17/2027   Hepatitis C Screening  Completed   PNA vac Low Risk Adult  Completed   HPV VACCINES  Aged Out    Health Maintenance  Health Maintenance Due  Topic Date Due   Zoster Vaccines- Shingrix (1 of 2) Never done    OPHTHALMOLOGY EXAM  01/23/2019  COVID-19 Vaccine (4 - Booster for Pfizer series) 09/14/2020    Colorectal cancer screening: No longer required.   Lung Cancer Screening: (Low Dose CT Chest recommended if Age 30-80 years, 30 pack-year currently smoking OR have quit w/in 15years.) does not qualify.   Lung Cancer Screening Referral: na  Additional Screening:  Hepatitis C Screening: does qualify; Completed   Vision Screening: Recommended annual ophthalmology exams for early detection of glaucoma and other disorders of the eye. Is the patient up to date with their annual eye exam?  No  Who is the provider or what is the name of the office in which the patient attends annual eye exams? Dr Eulas Post If pt is not established with a provider, would they like to be referred to a provider to establish care? No .   Dental Screening: Recommended annual dental exams for proper oral hygiene  Community Resource Referral / Chronic Care Management: CRR required this visit?  No   CCM required this visit?  No      Plan:     I have personally reviewed and noted the following in the patient's chart:   Medical and social history Use of alcohol, tobacco or illicit drugs  Current medications and supplements including opioid prescriptions. Patient is not currently taking opioid prescriptions. Functional ability and status Nutritional status Physical activity Advanced directives List of other physicians Hospitalizations, surgeries, and ER visits in previous 12 months Vitals Screenings to include cognitive, depression, and falls Referrals and appointments  In addition, I have reviewed and discussed with patient certain preventive protocols, quality metrics, and best practice recommendations. A written personalized care plan for preventive services as well as general preventive health recommendations were provided to patient.     Lauree Chandler, NP   02/25/2021    Virtual Visit via Telephone  Note  I connected withNAME@ on 02/25/21 at  8:30 AM EDT by telephone and verified that I am speaking with the correct person using two identifiers.  Location: Patient: home Provider: [facility]   I discussed the limitations, risks, security and privacy concerns of performing an evaluation and management service by telephone and the availability of in person appointments. I also discussed with the patient that there may be a patient responsible charge related to this service. The patient expressed understanding and agreed to proceed.   I discussed the assessment and treatment plan with the patient. The patient was provided an opportunity to ask questions and all were answered. The patient agreed with the plan and demonstrated an understanding of the instructions.   The patient was advised to call back or seek an in-person evaluation if the symptoms worsen or if the condition fails to improve as anticipated.  I provided 15 minutes of non-face-to-face time during this encounter.  [employer]. Harle Battiest Avs printed and mailed

## 2021-02-25 NOTE — Progress Notes (Signed)
   This service is provided via telemedicine  No vital signs collected/recorded due to the encounter was a telemedicine visit.   Location of patient (ex: home, work):  Home  Patient consents to a telephone visit: Yes, see telephone visit dated 02/25/21  Location of the provider (ex: office, home):  [facility], Office   Name of any referring provider:  N/A  Names of all persons participating in the telemedicine service and their role in the encounter:  S.Chrae B/CMA, Sherrie Mustache, NP, and Patient   Time spent on call:  8 min with medical assistant

## 2021-02-25 NOTE — Patient Instructions (Signed)
Nicholas Rivera , Thank you for taking time to come for your Medicare Wellness Visit. I appreciate your ongoing commitment to your health goals. Please review the following plan we discussed and let me know if I can assist you in the future.   Screening recommendations/referrals: Colonoscopy aged out Recommended yearly ophthalmology/optometry visit for glaucoma screening and checkup Recommended yearly dental visit for hygiene and checkup  Vaccinations: Influenza vaccine up to date Pneumococcal vaccine up to date Tdap vaccine up to date Shingles vaccine RECOMMENDED- to get at your local pharmacy    Advanced directives: on file  Conditions/risks identified: obesity, advanced age, diabetes, hypertension, hyperlipidemia.   Next appointment: 1 year for awv  Preventive Care 4 Years and Older, Male Preventive care refers to lifestyle choices and visits with your health care provider that can promote health and wellness. What does preventive care include? A yearly physical exam. This is also called an annual well check. Dental exams once or twice a year. Routine eye exams. Ask your health care provider how often you should have your eyes checked. Personal lifestyle choices, including: Daily care of your teeth and gums. Regular physical activity. Eating a healthy diet. Avoiding tobacco and drug use. Limiting alcohol use. Practicing safe sex. Taking low doses of aspirin every day. Taking vitamin and mineral supplements as recommended by your health care provider. What happens during an annual well check? The services and screenings done by your health care provider during your annual well check will depend on your age, overall health, lifestyle risk factors, and family history of disease. Counseling  Your health care provider may ask you questions about your: Alcohol use. Tobacco use. Drug use. Emotional well-being. Home and relationship well-being. Sexual activity. Eating  habits. History of falls. Memory and ability to understand (cognition). Work and work Statistician. Screening  You may have the following tests or measurements: Height, weight, and BMI. Blood pressure. Lipid and cholesterol levels. These may be checked every 5 years, or more frequently if you are over 85 years old. Skin check. Lung cancer screening. You may have this screening every year starting at age 44 if you have a 30-pack-year history of smoking and currently smoke or have quit within the past 15 years. Fecal occult blood test (FOBT) of the stool. You may have this test every year starting at age 22. Flexible sigmoidoscopy or colonoscopy. You may have a sigmoidoscopy every 5 years or a colonoscopy every 10 years starting at age 50. Prostate cancer screening. Recommendations will vary depending on your family history and other risks. Hepatitis C blood test. Hepatitis B blood test. Sexually transmitted disease (STD) testing. Diabetes screening. This is done by checking your blood sugar (glucose) after you have not eaten for a while (fasting). You may have this done every 1-3 years. Abdominal aortic aneurysm (AAA) screening. You may need this if you are a current or former smoker. Osteoporosis. You may be screened starting at age 4 if you are at high risk. Talk with your health care provider about your test results, treatment options, and if necessary, the need for more tests. Vaccines  Your health care provider may recommend certain vaccines, such as: Influenza vaccine. This is recommended every year. Tetanus, diphtheria, and acellular pertussis (Tdap, Td) vaccine. You may need a Td booster every 10 years. Zoster vaccine. You may need this after age 46. Pneumococcal 13-valent conjugate (PCV13) vaccine. One dose is recommended after age 34. Pneumococcal polysaccharide (PPSV23) vaccine. One dose is recommended after age 37. Talk  to your health care provider about which screenings and  vaccines you need and how often you need them. This information is not intended to replace advice given to you by your health care provider. Make sure you discuss any questions you have with your health care provider. Document Released: 09/18/2015 Document Revised: 05/11/2016 Document Reviewed: 06/23/2015 Elsevier Interactive Patient Education  2017 [place] Prevention in the Home Falls can cause injuries. They can happen to people of all ages. There are many things you can do to make your home safe and to help prevent falls. What can I do on the outside of my home? Regularly fix the edges of walkways and driveways and fix any cracks. Remove anything that might make you trip as you walk through a door, such as a raised step or threshold. Trim any bushes or trees on the path to your home. Use bright outdoor lighting. Clear any walking paths of anything that might make someone trip, such as rocks or tools. Regularly check to see if handrails are loose or broken. Make sure that both sides of any steps have handrails. Any raised decks and porches should have guardrails on the edges. Have any leaves, snow, or ice cleared regularly. Use sand or salt on walking paths during winter. Clean up any spills in your garage right away. This includes oil or grease spills. What can I do in the bathroom? Use night lights. Install grab bars by the toilet and in the tub and shower. Do not use towel bars as grab bars. Use non-skid mats or decals in the tub or shower. If you need to sit down in the shower, use a plastic, non-slip stool. Keep the floor dry. Clean up any water that spills on the floor as soon as it happens. Remove soap buildup in the tub or shower regularly. Attach bath mats securely with double-sided non-slip rug tape. Do not have throw rugs and other things on the floor that can make you trip. What can I do in the bedroom? Use night lights. Make sure that you have a light by your  bed that is easy to reach. Do not use any sheets or blankets that are too big for your bed. They should not hang down onto the floor. Have a firm chair that has side arms. You can use this for support while you get dressed. Do not have throw rugs and other things on the floor that can make you trip. What can I do in the kitchen? Clean up any spills right away. Avoid walking on wet floors. Keep items that you use a lot in easy-to-reach places. If you need to reach something above you, use a strong step stool that has a grab bar. Keep electrical cords out of the way. Do not use floor polish or wax that makes floors slippery. If you must use wax, use non-skid floor wax. Do not have throw rugs and other things on the floor that can make you trip. What can I do with my stairs? Do not leave any items on the stairs. Make sure that there are handrails on both sides of the stairs and use them. Fix handrails that are broken or loose. Make sure that handrails are as long as the stairways. Check any carpeting to make sure that it is firmly attached to the stairs. Fix any carpet that is loose or worn. Avoid having throw rugs at the top or bottom of the stairs. If you do have throw rugs,  attach them to the floor with carpet tape. Make sure that you have a light switch at the top of the stairs and the bottom of the stairs. If you do not have them, ask someone to add them for you. What else can I do to help prevent falls? Wear shoes that: Do not have high heels. Have rubber bottoms. Are comfortable and fit you well. Are closed at the toe. Do not wear sandals. If you use a stepladder: Make sure that it is fully opened. Do not climb a closed stepladder. Make sure that both sides of the stepladder are locked into place. Ask someone to hold it for you, if possible. Clearly mark and make sure that you can see: Any grab bars or handrails. First and last steps. Where the edge of each step is. Use tools that  help you move around (mobility aids) if they are needed. These include: Canes. Walkers. Scooters. Crutches. Turn on the lights when you go into a dark area. Replace any light bulbs as soon as they burn out. Set up your furniture so you have a clear path. Avoid moving your furniture around. If any of your floors are uneven, fix them. If there are any pets around you, be aware of where they are. Review your medicines with your doctor. Some medicines can make you feel dizzy. This can increase your chance of falling. Ask your doctor what other things that you can do to help prevent falls. This information is not intended to replace advice given to you by your health care provider. Make sure you discuss any questions you have with your health care provider. Document Released: 06/18/2009 Document Revised: 01/28/2016 Document Reviewed: 09/26/2014 Elsevier Interactive Patient Education  2017 [employer].

## 2021-02-25 NOTE — Telephone Encounter (Signed)
Mr. kaceton, vieau are scheduled for a virtual visit with your provider today.    Just as we do with appointments in the office, we must obtain your consent to participate.  Your consent will be active for this visit and any virtual visit you may have with one of our providers in the next 365 days.    If you have a MyChart account, I can also send a copy of this consent to you electronically.  All virtual visits are billed to your insurance company just like a traditional visit in the office.  As this is a virtual visit, video technology does not allow for your provider to perform a traditional examination.  This may limit your provider's ability to fully assess your condition.  If your provider identifies any concerns that need to be evaluated in person or the need to arrange testing such as labs, EKG, etc, we will make arrangements to do so.    Although advances in technology are sophisticated, we cannot ensure that it will always work on either your end or our end.  If the connection with a video visit is poor, we may have to switch to a telephone visit.  With either a video or telephone visit, we are not always able to ensure that we have a secure connection.   I need to obtain your verbal consent now.   Are you willing to proceed with your visit today?   KOUROSH JABLONSKY has provided verbal consent on 02/25/2021 for a virtual visit (video or telephone).   Leigh Aurora [place], [place] 02/25/2021  8:42 AM

## 2021-04-05 DIAGNOSIS — I200 Unstable angina: Secondary | ICD-10-CM | POA: Insufficient documentation

## 2021-04-07 ENCOUNTER — Ambulatory Visit: Payer: Medicare Other | Admitting: Family Medicine

## 2021-04-14 ENCOUNTER — Ambulatory Visit: Payer: Medicare Other | Admitting: Family Medicine

## 2021-04-14 ENCOUNTER — Other Ambulatory Visit: Payer: Medicare Other

## 2021-04-20 ENCOUNTER — Encounter: Payer: Self-pay | Admitting: Family

## 2021-04-21 ENCOUNTER — Encounter: Payer: Self-pay | Admitting: Family

## 2021-04-21 ENCOUNTER — Other Ambulatory Visit: Payer: Self-pay

## 2021-04-21 ENCOUNTER — Other Ambulatory Visit: Payer: Medicare Other

## 2021-04-21 ENCOUNTER — Telehealth (INDEPENDENT_AMBULATORY_CARE_PROVIDER_SITE_OTHER): Payer: Medicare Other | Admitting: Family

## 2021-04-21 DIAGNOSIS — E039 Hypothyroidism, unspecified: Secondary | ICD-10-CM | POA: Diagnosis not present

## 2021-04-21 DIAGNOSIS — I10 Essential (primary) hypertension: Secondary | ICD-10-CM | POA: Diagnosis not present

## 2021-04-21 DIAGNOSIS — Z9889 Other specified postprocedural states: Secondary | ICD-10-CM

## 2021-04-21 DIAGNOSIS — I4891 Unspecified atrial fibrillation: Secondary | ICD-10-CM

## 2021-04-21 DIAGNOSIS — N400 Enlarged prostate without lower urinary tract symptoms: Secondary | ICD-10-CM

## 2021-04-21 DIAGNOSIS — E1169 Type 2 diabetes mellitus with other specified complication: Secondary | ICD-10-CM

## 2021-04-21 DIAGNOSIS — I25110 Atherosclerotic heart disease of native coronary artery with unstable angina pectoris: Secondary | ICD-10-CM

## 2021-04-21 DIAGNOSIS — Z8781 Personal history of (healed) traumatic fracture: Secondary | ICD-10-CM

## 2021-04-21 DIAGNOSIS — I2510 Atherosclerotic heart disease of native coronary artery without angina pectoris: Secondary | ICD-10-CM | POA: Insufficient documentation

## 2021-04-21 DIAGNOSIS — K219 Gastro-esophageal reflux disease without esophagitis: Secondary | ICD-10-CM

## 2021-04-21 DIAGNOSIS — E785 Hyperlipidemia, unspecified: Secondary | ICD-10-CM

## 2021-04-21 NOTE — Progress Notes (Signed)
This service is provided via telemedicine  No vital signs collected/recorded due to the encounter was a telemedicine visit.   Location of patient (ex: home, work):  Home  Patient consents to a telephone visit:  Yes  Location of the provider (ex: office, home):  [employer].  Name of any referring provider:  [name], MD   Names of all persons participating in the telemedicine service and their role in the encounter:  Patient, Heriberto Antigua, [place], [place], Webb Silversmith, NP.    Time spent on call:  8 minutes spent on the phone with Medical Assistant.     Provider: Ligia Duguay FNP-C   [name], MD  Patient Care Team: [name], MD as PCP - General (Family Medicine) [name], MD as Consulting Physician (Urology) [name], MD as Consulting Physician (Ophthalmology) [name], MD as Consulting Physician (Ophthalmology)  Extended Emergency Contact Information Primary Emergency Contact: Payton Doughty Address: [address] [place], [place] [address] [place] Phone: [phone] Mobile Phone: [phone] Relation: Daughter Secondary Emergency Contact: Berneda Rose [place] Phone: [phone] Relation: Spouse  Code Status:  Full Code  Goals of care: Advanced Directive information Advanced Directives 04/20/2021  Does Patient Have a Medical Advance Directive? Yes  Type of Paramedic of [place];Living will  Does patient want to make changes to medical advance directive? No - Patient declined  Copy of [place] in Chart? Yes - validated most recent copy scanned in chart (See row information)     Chief Complaint  Patient presents with   Medical Management of Chronic Issues    4 month follow up.   Health Maintenance    Discuss the need for eye exam.   Immunizations    Discuss the need for shingrix vaccine, and influenza vaccine.    Concern      High Risk Fall    HPI:  Pt is a 80 y.o. male seen today for 4 months follow up medical management of chronic diseases.He was scheduled today for Mychart video visit but states unable to get on video today need telephone visit.  Has a medical history of Hypertension,Type 2 DM, BPH,Hypothyroidism,Obesity,Gout, hx of renal cancer with partial left Nephrectomy surgical removal among other condition.  He was seen 03/28/2021 ED at [facility] after he suffered a fall while in the [place] he slipped on some water and fell down the stairs and sustained a left ankle fracture dislocation and left proximal fibula fracture.Orthopedic was consulted and he underwent Open reduction and internal fixation of the left ankle syndesmosis 03/31/2021 done by Dr.Jason J.Halvorson.He states pain is under control.Has follow up visit today with Orthopedic for removal of sutures.   He was also admitted  at [facility] from 04/05/2021 - 04/15/2021 after he presented with lightheadedness with almost passing out, abdominal pain and foot pain.He had an abnormal stress test with epiosde of chest pain.Troponin was negative.Cardiac catheterization was done post 2 vessels stenting with no recurrence of chest pain.He was treated for Artrial Fibrillation with RVR.Lovenox was discontinued and started on EliQuis.Arterial flutter converted to sinus Rhythm on Sotalol Chads 2 vasc score was 4 on EliQuis without bleed. His metoprolol was stopped in favor of carvedilol 6.25 mg B.id.with gradual wean off BBB as an outpatient.Losartan was increased from 25 mg to 50 mg daily.Triamterene - Hydrochlorothiazide was added.low-dose nitrites  added. He was on statin with goal less than 70  Also had partial small bowel obstruction.Had hypokalemia which was replaced with 40 meq.  He states has 5 medication that was ordered by Cardiology tha the would like to update his medication list today. He would like Allopurinol  removed from med list since he does not take it.  Apixaban 5 mg tablet Bid  Diltiazem 240 mg 24 hr Cap QD  Prasugrel 10 mg tablet QD  Sotalol 80 mg tablet Bid  Triamterene- Hydrochlorothiazide 37.5 - 25 mg tablet daily   No home B/p or CBG readings for review.     Past Medical History:  Diagnosis Date   Back pain, lumbosacral 2014   Cancer of kidney ([place]) 2012   Cataract 2013   Diabetes mellitus without complication ([place]) 9741   Hypertension 2012   Nephrolithiasis 2012   Obesity, unspecified    Other and unspecified hyperlipidemia 04/03/2013   Pain, joint, ankle and foot    Unspecified hypothyroidism 2012   Past Surgical History:  Procedure Laterality Date   CATARACT EXTRACTION W/ INTRAOCULAR LENS IMPLANT Bilateral 03/05/2016   Dr. [name]   CYST REMOVAL TRUNK  11/03/2017   KIDNEY SURGERY  12/2010   had cancer removed; Dr. [name] CUFF REPAIR     bilateral; Dr. [name]    No Known Allergies  Allergies as of 04/21/2021   No Known Allergies      Medication List        Accurate as of April 21, 2021  8:39 AM. If you have any questions, ask your nurse or doctor.          STOP taking these medications    colchicine 0.6 MG tablet Stopped by: Sandrea Hughs, NP   Magnesium 500 MG Caps Stopped by: Sandrea Hughs, NP   multivitamin with minerals tablet Stopped by: Nelda Bucks Kimmie Doren, NP   Omega 3 500 500 MG Caps Stopped by: Sandrea Hughs, NP   VITAMIN B 12 PO Stopped by: Sandrea Hughs, NP   vitamin C 1000 MG tablet Stopped by: Sandrea Hughs, NP   Vitamin D3 50 MCG (2000 UT) Tabs Stopped by: Sandrea Hughs, NP       TAKE these medications    allopurinol 100 MG tablet Commonly known as: ZYLOPRIM Take one tablet by mouth once daily to prevent gout flare ups.   aspirin EC 81 MG tablet Take 1 tablet (81 mg total) by mouth daily. Swallow whole.   finasteride 5 MG tablet Commonly known as: PROSCAR Take 5 mg by mouth daily.    levothyroxine 50 MCG tablet Commonly known as: SYNTHROID Take 1 tablet (50 mcg total) by mouth daily before breakfast.   losartan 50 MG tablet Commonly known as: COZAAR TAKE 1 TABLET BY MOUTH  DAILY   metFORMIN 500 MG tablet Commonly known as: GLUCOPHAGE TAKE ONE TABLET BY MOUTH  TWICE DAILY TO CONTROL  DIABETES   omeprazole 20 MG capsule Commonly known as: PRILOSEC TAKE 1 CAPSULE BY MOUTH  DAILY   OneTouch Delica Lancets Fine Misc USE TO CHECK BLOOD SUGAR ONCE DAILY   OneTouch Ultra test strip Generic drug: glucose blood Test blood sugar once daily E11.69   rosuvastatin 5 MG tablet Commonly known as: CRESTOR TAKE 1 TABLET BY MOUTH  DAILY        Review of Systems  Constitutional:  Negative for appetite change, chills, fatigue, fever and unexpected weight change.  HENT:  Negative  for congestion, dental problem, ear discharge, ear pain, facial swelling, hearing loss, nosebleeds, postnasal drip, rhinorrhea, sinus pressure, sinus pain, sneezing, sore throat, tinnitus and trouble swallowing.   Eyes:  Negative for pain, discharge, redness, itching and visual disturbance.  Respiratory:  Negative for cough, chest tightness, shortness of breath and wheezing.   Cardiovascular:  Negative for chest pain, palpitations and leg swelling.  Gastrointestinal:  Negative for abdominal distention, abdominal pain, blood in stool, constipation, diarrhea, nausea and vomiting.  Endocrine: Negative for cold intolerance, heat intolerance, polydipsia, polyphagia and polyuria.  Genitourinary:  Negative for difficulty urinating, dysuria, flank pain, frequency and urgency.  Musculoskeletal:  Negative for back pain, gait problem, joint swelling, myalgias, neck pain and neck stiffness.       Left ankle pain post surgery has follow up appointment with Ortho today for suture removal   Skin:  Negative for color change, pallor, rash and wound.  Neurological:  Negative for dizziness, syncope, speech  difficulty, weakness, light-headedness, numbness and headaches.  Hematological:  Does not bruise/bleed easily.  Psychiatric/Behavioral:  Negative for agitation, behavioral problems, confusion, hallucinations, self-injury, sleep disturbance and suicidal ideas. The patient is not nervous/anxious.    Immunization History  Administered Date(s) Administered   Influenza, High Dose Seasonal PF 06/20/2017, 06/02/2018, 04/30/2019, 06/28/2020   Influenza,inj,Quad PF,6+ Mos 05/13/2015   Influenza-Unspecified 06/05/2013, 06/20/2014, 05/30/2016   PFIZER(Purple Top)SARS-COV-2 Vaccination 09/22/2019, 10/13/2019, 06/14/2020, 12/28/2020   Pneumococcal Conjugate-13 06/04/2014   Pneumococcal Polysaccharide-23 09/05/1998, 08/03/2020   Td 09/06/2003, 10/16/2017   Pertinent  Health Maintenance Due  Topic Date Due   OPHTHALMOLOGY EXAM  01/23/2019   INFLUENZA VACCINE  04/05/2021   HEMOGLOBIN A1C  05/29/2021   FOOT EXAM  11/26/2021   PNA vac Low Risk Adult  Completed   Fall Risk  04/21/2021 02/25/2021 11/26/2020 03/30/2020 02/25/2020  Falls in the past year? 1 1 0 0 1  Number falls in past yr: 1 0 0 0 0  Injury with Fall? 1 0 0 0 0  Risk for fall due to : History of fall(s) Impaired balance/gait - - -  Follow up Falls evaluation completed Falls evaluation completed - - -   Functional Status Survey:    There were no vitals filed for this visit. There is no height or weight on file to calculate BMI. Physical Exam Unable to complete on the phone.  Labs reviewed: Recent Labs    07/29/20 0856 11/26/20 0000  NA 138 138  K 4.4 4.3  CL 105 104  CO2 22 21  GLUCOSE 108* 102*  BUN 14 15  CREATININE 1.14 1.00  CALCIUM 9.4 9.4   Recent Labs    11/26/20 0000  AST 17  ALT 13  BILITOT 0.4  PROT 6.1   Recent Labs    11/26/20 0000  WBC 7.9  NEUTROABS 4,606  HGB 13.9  HCT 42.1  MCV 86.8  PLT 266   Lab Results  Component Value Date   TSH 1.42 11/26/2020   Lab Results  Component Value Date    HGBA1C 6.0 (H) 11/26/2020   Lab Results  Component Value Date   CHOL 129 11/26/2020   HDL 44 11/26/2020   LDLCALC 63 11/26/2020   TRIG 134 11/26/2020   CHOLHDL 2.9 11/26/2020    Significant Diagnostic Results in last 30 days:  No results found.  Assessment/Plan 1. Type 2 diabetes mellitus with other specified complication, without long-term current use of insulin (HCC) Lab Results  Component Value Date   HGBA1C 6.0 (H)  11/26/2020  No home CBG readings for review.denied any Hypo/hyperglycemia symptoms.  - continue on Metformin 500 mg tablet twice daily. - on statin  Due for annual eye exam will post pone due to left ankle surgery.   2. Hyperlipidemia associated with type 2 diabetes mellitus ([place]) Latest LDL at goal 63  - continue on Rosuvastatin 5 mg tablet - continue on dietary changes.Unable to exercise currently due to recent surgery on left foot.   3. Acquired hypothyroidism Lab Results  Component Value Date   TSH 1.42 11/26/2020  Continue on levothyroxine 50 mcg tablet daily.  4. Primary hypertension No Blood pressure readings for review. - continue on Diltiazem ,Losartan,Maxzide,sotalol  - continue to follow up with cardiologist   5. Benign prostatic hyperplasia, unspecified whether lower urinary tract symptoms present Symptoms stable  - continue on finasteride   6. Gastroesophageal reflux disease without esophagitis Stable.Reports no signs of GI bleed. - continue on Omeprazole   7. Atrial fibrillation, unspecified type [facility]) Status post hospital admission 04/05/2021 -04/15/2021 with Afib with RVR converted to NSR on Cardizem and Sotalol  - continue on sotalol,Apixaban and diltiazem - continue to follow up with Dr.Boyce   8. Coronary artery disease involving native coronary artery of native heart with unstable angina pectoris (HCC) Status post left heart catheterization which showed 995 circumflex obtuse marginal lesion stent placed x 2  Chest pain free today   Continue on Prasugrel and Apixaban  - follow up with Dr.Cowherd as directed. - Cardiac Rehab as directed   9. Status post ORIF of fracture of ankle Reports pain under controlled. Has follow up appointment with Ortho for suture removal today.   Family/ staff Communication: Reviewed plan of care with patient verbalized understanding.   Labs/tests ordered:  - CBC with Differential/Platelet - CMP with eGFR([facility]) - TSH - Hgb A1C - Lipid panel  Next Appointment : 4 months for medical management of chronic issues.Fasting Labs prior to visit.  I connected with  Sanda Linger on 04/21/21 by a Telephone enabled telemedicine application and verified that I am speaking with the correct person using two identifiers.   I discussed the limitations of evaluation and management by telemedicine. The patient expressed understanding and agreed to proceed.  Spent 25 minutes of non-face to face with patient    Sandrea Hughs, NP

## 2021-04-28 ENCOUNTER — Other Ambulatory Visit: Payer: Self-pay

## 2021-04-28 DIAGNOSIS — E119 Type 2 diabetes mellitus without complications: Secondary | ICD-10-CM

## 2021-04-28 MED ORDER — ONETOUCH ULTRA VI STRP: ORAL_STRIP | 3 refills | Status: DC

## 2021-04-28 MED ORDER — METFORMIN HCL 500 MG PO TABS: ORAL_TABLET | ORAL | 3 refills | Status: DC

## 2021-05-05 ENCOUNTER — Other Ambulatory Visit: Payer: Self-pay | Admitting: *Deleted

## 2021-05-05 MED ORDER — UNABLE TO FIND: 0 refills | Status: DC

## 2021-05-05 MED ORDER — ONETOUCH ULTRA 2 W/DEVICE KIT: PACK | 0 refills | Status: DC

## 2021-05-05 NOTE — Telephone Encounter (Signed)
Mandy with [facility] called requesting a new One Touch Ultra Meter. His has broken.  Faxed to [employer].

## 2021-05-05 NOTE — Addendum Note (Signed)
Addended by: Rafael Bihari A on: 05/05/2021 09:28 AM   Modules accepted: Orders

## 2021-05-24 ENCOUNTER — Other Ambulatory Visit: Payer: Self-pay | Admitting: *Deleted

## 2021-05-24 DIAGNOSIS — I10 Essential (primary) hypertension: Secondary | ICD-10-CM

## 2021-05-24 MED ORDER — LOSARTAN POTASSIUM 50 MG PO TABS: 50.0000 mg | ORAL_TABLET | Freq: Every day | ORAL | 3 refills | Status: DC

## 2021-05-24 NOTE — Telephone Encounter (Signed)
[facility] requested refill.  ? ?

## 2021-07-01 ENCOUNTER — Other Ambulatory Visit: Payer: Self-pay

## 2021-07-01 ENCOUNTER — Telehealth (INDEPENDENT_AMBULATORY_CARE_PROVIDER_SITE_OTHER): Payer: Medicare Other | Admitting: Nurse Practitioner

## 2021-07-01 ENCOUNTER — Telehealth: Payer: Self-pay | Admitting: *Deleted

## 2021-07-01 DIAGNOSIS — M1A30X0 Chronic gout due to renal impairment, unspecified site, without tophus (tophi): Secondary | ICD-10-CM

## 2021-07-01 MED ORDER — ALLOPURINOL 100 MG PO TABS: 100.0000 mg | ORAL_TABLET | Freq: Every day | ORAL | 6 refills | Status: DC

## 2021-07-01 MED ORDER — COLCHICINE 0.6 MG PO TABS: ORAL_TABLET | ORAL | 1 refills | Status: DC

## 2021-07-01 NOTE — Telephone Encounter (Signed)
He will need a visit- allopurinol is not for acute gout but keeping uric acid levels low  Would recommend office visit- can be virtual.

## 2021-07-01 NOTE — Progress Notes (Signed)
This service is provided via telemedicine  No vital signs collected/recorded due to the encounter was a telemedicine visit.   Location of patient (ex: home, work):  Home  Patient consents to a telephone visit:  Yes, see encounter dated 02/25/2021  Location of the provider (ex: office, home):  [place]  Name of any referring provider:  [name], MD  Names of all persons participating in the telemedicine service and their role in the encounter:  Sherrie Mustache, Nurse Practitioner, Carroll Kinds, CMA, and patient.   Time spent on call:  8 minutes with medical assistant

## 2021-07-01 NOTE — Telephone Encounter (Signed)
Patient called and stated that he is having a gout flare in his Right Big Toe and requesting a refill on his Allopurinol.  Stated that he has taken this in the past.  Medication is not in patient's current mediation list.  Can we add this and send to pharmacy [facility]  Please Advise. (Pended and sent to Janett Billow due to Dr. Sabra Heck out of office)

## 2021-07-01 NOTE — Telephone Encounter (Signed)
Patient notified and schedule MyChart Visit.

## 2021-07-01 NOTE — Progress Notes (Signed)
Careteam: Patient Care Team: [name], MD as PCP - General (Family Medicine) [name], MD as Consulting Physician (Urology) [name], MD as Consulting Physician (Ophthalmology) [name], MD as Consulting Physician (Ophthalmology)  Advanced Directive information    No Known Allergies  Chief Complaint  Patient presents with   Acute Visit    Patient complains of gout flare up in right big toe. Patietn has had flare up for several weeks now. Patient would like to have allopurinaol refilled. He has taken it in the past.     HPI: Patient is a 80 y.o. male due to gout.  Pt reports he has hx of gout but has not been taking medication in quite some time. In march he reported that he had 2 flares in the last 6 months. Reports he has not had any flares since March.  He has broken his ankle/leg and is wheelchair bound. Staying with son for 12 week and he is cooking for him.   He has also had a fib and hospitalization related to that which they found significant CAD and resulted in 2 stents per pt.   Reports big toe on right foot is red and swollen. It has been bothering him for a few weeks. Was hoping it was going to get better on his own.  Review of Systems:  Review of Systems  Constitutional:  Negative for chills and fever.  Musculoskeletal:  Positive for joint pain and myalgias.   Past Medical History:  Diagnosis Date   Atrial fibrillation ([place])    Back pain, lumbosacral 09/05/2012   CAD (coronary artery disease), native coronary artery    Cancer of kidney ([place]) 09/05/2010   Cataract 09/06/2011   Diabetes mellitus without complication ([place]) 62/83/1517   Hypertension 09/05/2010   Nephrolithiasis 09/05/2010   Obesity, unspecified    Other and unspecified hyperlipidemia 04/03/2013   Pain, joint, ankle and foot    Unspecified hypothyroidism 09/05/2010   Past Surgical History:  Procedure Laterality Date   CATARACT EXTRACTION W/ INTRAOCULAR LENS  IMPLANT Bilateral 03/05/2016   Dr. [name]   CYST REMOVAL TRUNK  11/03/2017   KIDNEY SURGERY  12/2010   had cancer removed; Dr. [name] CUFF REPAIR     bilateral; Dr. [name]   Social History:   reports that he has never smoked. He has never used smokeless tobacco. He reports that he does not drink alcohol and does not use drugs.  No family history on file.  Medications: Patient's Medications  New Prescriptions   No medications on file  Previous Medications   APIXABAN (ELIQUIS) 5 MG TABS TABLET    Take 5 mg by mouth 2 (two) times daily.   BLOOD GLUCOSE MONITORING SUPPL (ONE TOUCH ULTRA 2) W/DEVICE KIT    Use to test blood sugar daily. Dx: E11.69   DILTIAZEM (DILACOR XR) 240 MG 24 HR CAPSULE    Take 240 mg by mouth daily.   FINASTERIDE (PROSCAR) 5 MG TABLET    Take 5 mg by mouth daily.   GLUCOSE BLOOD (ONETOUCH ULTRA) TEST STRIP    Test blood sugar once daily E11.69   LEVOTHYROXINE (SYNTHROID) 50 MCG TABLET    Take 1 tablet (50 mcg total) by mouth daily before breakfast.   LOSARTAN (COZAAR) 25 MG TABLET    Take 25 mg by mouth daily.   METFORMIN (GLUCOPHAGE) 500 MG TABLET    TAKE ONE TABLET BY MOUTH  TWICE DAILY TO CONTROL  DIABETES  OMEPRAZOLE (PRILOSEC) 20 MG CAPSULE    TAKE 1 CAPSULE BY MOUTH  DAILY   ONETOUCH DELICA LANCETS FINE MISC    USE TO CHECK BLOOD SUGAR ONCE DAILY   ROSUVASTATIN (CRESTOR) 5 MG TABLET    TAKE 1 TABLET BY MOUTH  DAILY   SOTALOL (BETAPACE) 80 MG TABLET    Take 80 mg by mouth 2 (two) times daily.  Modified Medications   No medications on file  Discontinued Medications   ASPIRIN EC 81 MG TABLET    Take 1 tablet (81 mg total) by mouth daily. Swallow whole.   LOSARTAN (COZAAR) 50 MG TABLET    Take 1 tablet (50 mg total) by mouth daily.   PRASUGREL (EFFIENT) 10 MG TABS TABLET    Take 10 mg by mouth daily.   TRIAMTERENE-HYDROCHLOROTHIAZIDE (MAXZIDE-25) 37.5-25 MG TABLET    Take 1 tablet by mouth daily.    Physical Exam:  There were no vitals filed for  this visit. There is no height or weight on file to calculate BMI. Wt Readings from Last 3 Encounters:  11/26/20 224 lb 9.6 oz (101.9 kg)  08/03/20 226 lb (102.5 kg)  03/30/20 (!) 224 lb (101.6 kg)      Labs reviewed: Basic Metabolic Panel: Recent Labs    07/29/20 0856 11/26/20 0000  NA 138 138  K 4.4 4.3  CL 105 104  CO2 22 21  GLUCOSE 108* 102*  BUN 14 15  CREATININE 1.14 1.00  CALCIUM 9.4 9.4  TSH  --  1.42   Liver Function Tests: Recent Labs    11/26/20 0000  AST 17  ALT 13  BILITOT 0.4  PROT 6.1   No results for input(s): LIPASE, AMYLASE in the last 8760 hours. No results for input(s): AMMONIA in the last 8760 hours. CBC: Recent Labs    11/26/20 0000  WBC 7.9  NEUTROABS 4,606  HGB 13.9  HCT 42.1  MCV 86.8  PLT 266   Lipid Panel: Recent Labs    07/29/20 0856 11/26/20 0000  CHOL 146 129  HDL 44 44  LDLCALC 68 63  TRIG 260* 134  CHOLHDL 3.3 2.9   TSH: Recent Labs    11/26/20 0000  TSH 1.42   A1C: Lab Results  Component Value Date   HGBA1C 6.0 (H) 11/26/2020     Assessment/Plan 1. Chronic gout due to renal impairment without tophus, unspecified site -acute flare at this time.  -dietary and lifestyle modifications  - colchicine 0.6 MG tablet; Take 2 tablets now and then 1 tablet in 1 hour. Do not repeat for 3 days  Dispense: 3 tablet; Refill: 1 - after acute flare recommended to start allopurinol (ZYLOPRIM) 100 MG tablet; Take 1 tablet (100 mg total) by mouth daily.  Dispense: 30 tablet; Refill: 6  Next appt: 08/18/2021 Carlos American. Harle Battiest  [facility] [phone]    Virtual Visit via telephone- unable to do video  I connected with patient on 07/01/21 at  1:30 PM EDT by telephone and verified that I am speaking with the correct person using two identifiers.  Location: Patient: home Provider: [place]   I discussed the limitations, risks, security and privacy concerns of performing an  evaluation and management service by telephone and the availability of in person appointments. I also discussed with the patient that there may be a patient responsible charge related to this service. The patient expressed understanding and agreed to proceed.   I discussed the assessment  and treatment plan with the patient. The patient was provided an opportunity to ask questions and all were answered. The patient agreed with the plan and demonstrated an understanding of the instructions.   The patient was advised to call back or seek an in-person evaluation if the symptoms worsen or if the condition fails to improve as anticipated.  I provided 15 minutes of non-face-to-face time during this encounter.  Carlos American. Harle Battiest Avs printed and mailed

## 2021-07-05 ENCOUNTER — Other Ambulatory Visit: Payer: Self-pay | Admitting: *Deleted

## 2021-07-05 DIAGNOSIS — E1169 Type 2 diabetes mellitus with other specified complication: Secondary | ICD-10-CM

## 2021-07-05 DIAGNOSIS — E785 Hyperlipidemia, unspecified: Secondary | ICD-10-CM

## 2021-07-05 MED ORDER — ROSUVASTATIN CALCIUM 5 MG PO TABS: 5.0000 mg | ORAL_TABLET | Freq: Every day | ORAL | 1 refills | Status: DC

## 2021-07-05 NOTE — Telephone Encounter (Signed)
[facility] requested refill.  Pended Rx and sent to [facility] for approval due to Dr. Sabra Heck out of office.

## 2021-07-07 ENCOUNTER — Other Ambulatory Visit: Payer: Self-pay | Admitting: *Deleted

## 2021-07-07 MED ORDER — OMEPRAZOLE 20 MG PO CPDR
20.0000 mg | DELAYED_RELEASE_CAPSULE | Freq: Every day | ORAL | 3 refills | Status: DC
Start: 2021-07-07 — End: 2022-05-16

## 2021-07-07 NOTE — Telephone Encounter (Signed)
[facility] 

## 2021-08-18 ENCOUNTER — Ambulatory Visit (INDEPENDENT_AMBULATORY_CARE_PROVIDER_SITE_OTHER): Payer: Medicare Other | Admitting: Family Medicine

## 2021-08-18 ENCOUNTER — Encounter: Payer: Self-pay | Admitting: Family Medicine

## 2021-08-18 ENCOUNTER — Other Ambulatory Visit: Payer: Self-pay

## 2021-08-18 ENCOUNTER — Other Ambulatory Visit: Payer: Medicare Other

## 2021-08-18 VITALS — BP 140/70 | HR 56 | Temp 97.1°F | Ht 63.0 in | Wt 202.8 lb

## 2021-08-18 DIAGNOSIS — I10 Essential (primary) hypertension: Secondary | ICD-10-CM | POA: Diagnosis not present

## 2021-08-18 DIAGNOSIS — I4891 Unspecified atrial fibrillation: Secondary | ICD-10-CM

## 2021-08-18 DIAGNOSIS — K219 Gastro-esophageal reflux disease without esophagitis: Secondary | ICD-10-CM

## 2021-08-18 DIAGNOSIS — E1169 Type 2 diabetes mellitus with other specified complication: Secondary | ICD-10-CM | POA: Diagnosis not present

## 2021-08-18 DIAGNOSIS — E039 Hypothyroidism, unspecified: Secondary | ICD-10-CM

## 2021-08-18 LAB — HM DIABETES EYE EXAM

## 2021-08-18 NOTE — Progress Notes (Signed)
Provider:  [name], MD  Careteam: Patient Care Team: [name], MD as PCP - General (Family Medicine) [name], MD as Consulting Physician (Urology) [name], MD as Consulting Physician (Ophthalmology) [name], MD as Consulting Physician (Ophthalmology)  PLACE OF SERVICE:  [place] Directive information    No Known Allergies  Chief Complaint  Patient presents with   Medical Management of Chronic Issues    Routine follow up.   Health Maintenance    Zoster vaccine, eye exam,Hemoglobin A1C     HPI: Patient is a 80 y.o. male since his last visit patient had a fall while camping and broke his left ankle and leg.  He was hospitalized in [facility].  Had some chest pain underwent cath and had 2 stents placed.  Was also found to be in A. fib.  Now he is is on Eliquis as well as Prasurgel.  Ideally is that he will come off of the latter and take Plavix after 6 months.  He is staying with his son who lives in [place] and seeing the cardiologist and therapist there.  Also takes sotalol for A. fib as well as diltiazem. Lipids and liver functions along with A1c was last assessed 6 months ago.  He had most of the labs checked while he was in the hospital in August but we will plan to repeat at his next visit here and do labs and visit on same day  Review of Systems:  Review of Systems  Constitutional:  Positive for weight loss.  HENT: Negative.    Eyes: Negative.   Respiratory: Negative.    Cardiovascular: Negative.   Neurological: Negative.   Psychiatric/Behavioral: Negative.    All other systems reviewed and are negative.  Past Medical History:  Diagnosis Date   Atrial fibrillation ([place])    Back pain, lumbosacral 09/05/2012   CAD (coronary artery disease), native coronary artery    Cancer of kidney ([place]) 09/05/2010   Cataract 09/06/2011   Diabetes mellitus without complication ([place]) 03/54/6568   Hypertension 09/05/2010    Nephrolithiasis 09/05/2010   Obesity, unspecified    Other and unspecified hyperlipidemia 04/03/2013   Pain, joint, ankle and foot    Unspecified hypothyroidism 09/05/2010   Past Surgical History:  Procedure Laterality Date   ANKLE SURGERY     CATARACT EXTRACTION W/ INTRAOCULAR LENS IMPLANT Bilateral 03/05/2016   Dr. [name]   CORONARY ANGIOPLASTY WITH STENT PLACEMENT     CYST REMOVAL TRUNK  11/03/2017   KIDNEY SURGERY  12/2010   had cancer removed; Dr. [name] CUFF REPAIR     bilateral; Dr. [name]   Social History:   reports that he has never smoked. He has never used smokeless tobacco. He reports that he does not drink alcohol and does not use drugs.  History reviewed. No pertinent family history.  Medications: Patient's Medications  New Prescriptions   No medications on file  Previous Medications   ALLOPURINOL (ZYLOPRIM) 100 MG TABLET    Take 1 tablet (100 mg total) by mouth daily.   APIXABAN (ELIQUIS) 5 MG TABS TABLET    Take 5 mg by mouth 2 (two) times daily.   BLOOD GLUCOSE MONITORING SUPPL (ONE TOUCH ULTRA 2) W/DEVICE KIT    Use to test blood sugar daily. Dx: E11.69   DILTIAZEM (DILACOR XR) 240 MG 24 HR CAPSULE    Take 240 mg by mouth daily.   FINASTERIDE (PROSCAR) 5 MG TABLET  Take 5 mg by mouth daily.   GLUCOSE BLOOD (ONETOUCH ULTRA) TEST STRIP    Test blood sugar once daily E11.69   LEVOTHYROXINE (SYNTHROID) 50 MCG TABLET    Take 1 tablet (50 mcg total) by mouth daily before breakfast.   LOSARTAN (COZAAR) 25 MG TABLET    Take 12.5 mg by mouth daily. 1/2 tablet=12.5mg  daily   METFORMIN (GLUCOPHAGE) 500 MG TABLET    TAKE ONE TABLET BY MOUTH  TWICE DAILY TO CONTROL  DIABETES   OMEPRAZOLE (PRILOSEC) 20 MG CAPSULE    Take 1 capsule (20 mg total) by mouth daily.   ONETOUCH DELICA LANCETS FINE MISC    USE TO CHECK BLOOD SUGAR ONCE DAILY   PRASUGREL (EFFIENT) 10 MG TABS TABLET    Take 10 mg by mouth daily.   ROSUVASTATIN (CRESTOR) 5 MG TABLET    Take 1 tablet (5 mg  total) by mouth daily.   SOTALOL (BETAPACE) 80 MG TABLET    Take 80 mg by mouth 2 (two) times daily.  Modified Medications   No medications on file  Discontinued Medications   COLCHICINE 0.6 MG TABLET    Take 2 tablets now and then 1 tablet in 1 hour. Do not repeat for 3 days    Physical Exam:  There were no vitals filed for this visit. There is no height or weight on file to calculate BMI. Wt Readings from Last 3 Encounters:  11/26/20 224 lb 9.6 oz (101.9 kg)  08/03/20 226 lb (102.5 kg)  03/30/20 (!) 224 lb (101.6 kg)    Physical Exam Vitals and nursing note reviewed.  Constitutional:      Appearance: Normal appearance.  Cardiovascular:     Rate and Rhythm: Normal rate and regular rhythm.     Pulses: Normal pulses.  Pulmonary:     Effort: Pulmonary effort is normal.     Breath sounds: Normal breath sounds.  Musculoskeletal:        General: Normal range of motion.     Comments: Does not use any aid to walk but has some limp as he continues to rehab his ankle and leg  Neurological:     General: No focal deficit present.     Mental Status: He is alert and oriented to person, place, and time.  Psychiatric:        Mood and Affect: Mood normal.        Behavior: Behavior normal.    Labs reviewed: Basic Metabolic Panel: Recent Labs    11/26/20 0000  NA 138  K 4.3  CL 104  CO2 21  GLUCOSE 102*  BUN 15  CREATININE 1.00  CALCIUM 9.4  TSH 1.42   Liver Function Tests: Recent Labs    11/26/20 0000  AST 17  ALT 13  BILITOT 0.4  PROT 6.1   No results for input(s): LIPASE, AMYLASE in the last 8760 hours. No results for input(s): AMMONIA in the last 8760 hours. CBC: Recent Labs    11/26/20 0000  WBC 7.9  NEUTROABS 4,606  HGB 13.9  HCT 42.1  MCV 86.8  PLT 266   Lipid Panel: Recent Labs    11/26/20 0000  CHOL 129  HDL 44  LDLCALC 63  TRIG 134  CHOLHDL 2.9   TSH: Recent Labs    11/26/20 0000  TSH 1.42   A1C: Lab Results  Component Value Date    HGBA1C 6.0 (H) 11/26/2020     Assessment/Plan  1. Atrial fibrillation, unspecified type ([place])  Patient is in normal sinus rhythm today.  Continues to take Eliquis.  Cardiologist at [facility] talked about an ablation but then he went back to sinus rhythm  2. Primary hypertension Blood pressure is good at 140/70 on losartan and diltiazem  3. Type 2 diabetes mellitus with other specified complication, without long-term current use of insulin ([place]) Continues with metformin.  He has some questions about what he has heard or read about danger of taking metformin.  I tried to reassure him that all is well so far  4. Acquired hypothyroidism Last TSH 9 months ago was 1.4 we will recheck at next visit in 4 months   [name], MD [place] [phone]

## 2021-09-30 DIAGNOSIS — S82892D Other fracture of left lower leg, subsequent encounter for closed fracture with routine healing: Secondary | ICD-10-CM | POA: Diagnosis not present

## 2021-09-30 DIAGNOSIS — M79662 Pain in left lower leg: Secondary | ICD-10-CM | POA: Diagnosis not present

## 2021-11-11 DIAGNOSIS — I4891 Unspecified atrial fibrillation: Secondary | ICD-10-CM | POA: Diagnosis not present

## 2021-11-11 DIAGNOSIS — E785 Hyperlipidemia, unspecified: Secondary | ICD-10-CM | POA: Diagnosis not present

## 2021-11-11 DIAGNOSIS — I2510 Atherosclerotic heart disease of native coronary artery without angina pectoris: Secondary | ICD-10-CM | POA: Diagnosis not present

## 2021-11-11 DIAGNOSIS — E119 Type 2 diabetes mellitus without complications: Secondary | ICD-10-CM | POA: Diagnosis not present

## 2021-11-11 DIAGNOSIS — I10 Essential (primary) hypertension: Secondary | ICD-10-CM | POA: Diagnosis not present

## 2021-11-28 ENCOUNTER — Other Ambulatory Visit: Payer: Self-pay | Admitting: Nurse Practitioner

## 2021-11-28 DIAGNOSIS — E1169 Type 2 diabetes mellitus with other specified complication: Secondary | ICD-10-CM

## 2021-12-17 ENCOUNTER — Other Ambulatory Visit: Payer: Self-pay | Admitting: Family Medicine

## 2021-12-17 DIAGNOSIS — E039 Hypothyroidism, unspecified: Secondary | ICD-10-CM

## 2021-12-21 ENCOUNTER — Ambulatory Visit: Payer: Medicare Other | Admitting: Family Medicine

## 2021-12-21 DIAGNOSIS — I4891 Unspecified atrial fibrillation: Secondary | ICD-10-CM | POA: Diagnosis not present

## 2021-12-21 DIAGNOSIS — I2510 Atherosclerotic heart disease of native coronary artery without angina pectoris: Secondary | ICD-10-CM | POA: Diagnosis not present

## 2021-12-21 DIAGNOSIS — E118 Type 2 diabetes mellitus with unspecified complications: Secondary | ICD-10-CM | POA: Diagnosis not present

## 2021-12-21 DIAGNOSIS — E785 Hyperlipidemia, unspecified: Secondary | ICD-10-CM | POA: Diagnosis not present

## 2021-12-21 DIAGNOSIS — I10 Essential (primary) hypertension: Secondary | ICD-10-CM | POA: Diagnosis not present

## 2021-12-22 ENCOUNTER — Ambulatory Visit: Payer: Medicare Other | Admitting: Family Medicine

## 2022-01-10 DIAGNOSIS — I480 Paroxysmal atrial fibrillation: Secondary | ICD-10-CM | POA: Diagnosis not present

## 2022-01-10 DIAGNOSIS — I483 Typical atrial flutter: Secondary | ICD-10-CM | POA: Diagnosis not present

## 2022-02-21 ENCOUNTER — Other Ambulatory Visit: Payer: Self-pay | Admitting: Family Medicine

## 2022-02-21 DIAGNOSIS — E119 Type 2 diabetes mellitus without complications: Secondary | ICD-10-CM

## 2022-02-23 ENCOUNTER — Encounter: Payer: Self-pay | Admitting: Family Medicine

## 2022-02-23 ENCOUNTER — Ambulatory Visit (INDEPENDENT_AMBULATORY_CARE_PROVIDER_SITE_OTHER): Payer: Medicare Other | Admitting: Family Medicine

## 2022-02-23 VITALS — BP 138/98 | HR 53 | Temp 96.9°F | Ht 63.0 in | Wt 209.0 lb

## 2022-02-23 DIAGNOSIS — I10 Essential (primary) hypertension: Secondary | ICD-10-CM | POA: Diagnosis not present

## 2022-02-23 DIAGNOSIS — E039 Hypothyroidism, unspecified: Secondary | ICD-10-CM | POA: Diagnosis not present

## 2022-02-23 DIAGNOSIS — I4891 Unspecified atrial fibrillation: Secondary | ICD-10-CM

## 2022-02-23 DIAGNOSIS — E1169 Type 2 diabetes mellitus with other specified complication: Secondary | ICD-10-CM | POA: Diagnosis not present

## 2022-02-23 DIAGNOSIS — I25110 Atherosclerotic heart disease of native coronary artery with unstable angina pectoris: Secondary | ICD-10-CM | POA: Diagnosis not present

## 2022-02-23 NOTE — Progress Notes (Signed)
Provider:  Jacalyn Lefevre, MD  Careteam: Patient Care Team: Frederica Kuster, MD as PCP - General (Family Medicine) Heloise Purpura, MD as Consulting Physician (Urology) Stephannie Li, MD as Consulting Physician (Ophthalmology) Nelson Chimes, MD as Consulting Physician (Ophthalmology)  PLACE OF SERVICE:  [facility]  [place] Directive information    No Known Allergies  Chief Complaint  Patient presents with   Medical Management of Chronic Issues    Patient presents today for a 6 month follow-up.   Quality Metric Gaps    Foot & eye exam, A1C, zoster, COVID booster #6     HPI: Patient is a 81 y.o. male patient presents this morning to follow-up with his diabetes and hypertension.  He also is followed by cardiology for A-fib.  He continues on a beta-blocker calcium channel blocker and Eliquis for A-fib. When he monitors his blood pressure at home it is always normal but it is elevated somewhat today. He checks blood sugars infrequently and that is probably okay since his last A1c was 6.0.  He takes metformin 500 mg twice daily with food.  Review of Systems:  Review of Systems  Constitutional: Negative.   Eyes: Negative.   Respiratory: Negative.    Cardiovascular: Negative.   Gastrointestinal: Negative.   Genitourinary: Negative.   Musculoskeletal: Negative.   All other systems reviewed and are negative.   Past Medical History:  Diagnosis Date   Atrial fibrillation ([facility])    Back pain, lumbosacral 09/05/2012   CAD (coronary artery disease), native coronary artery    Cancer of kidney ([facility]) 09/05/2010   Cataract 09/06/2011   Diabetes mellitus without complication ([facility]) 09/05/2010   Hypertension 09/05/2010   Nephrolithiasis 09/05/2010   Obesity, unspecified    Other and unspecified hyperlipidemia 04/03/2013   Pain, joint, ankle and foot    Unspecified hypothyroidism 09/05/2010   Past Surgical History:  Procedure Laterality Date   ANKLE SURGERY     CATARACT  EXTRACTION W/ INTRAOCULAR LENS IMPLANT Bilateral 03/05/2016   Dr. Hazle Quant   CORONARY ANGIOPLASTY WITH STENT PLACEMENT     CYST REMOVAL TRUNK  11/03/2017   KIDNEY SURGERY  12/2010   had cancer removed; Dr. Jerl Mina CUFF REPAIR     bilateral; Dr. Ranell Patrick   Social History:   reports that he has never smoked. He has never used smokeless tobacco. He reports that he does not drink alcohol and does not use drugs.  No family history on file.  Medications: Patient's Medications  New Prescriptions   No medications on file  Previous Medications   ALLOPURINOL (ZYLOPRIM) 100 MG TABLET    Take 1 tablet (100 mg total) by mouth daily.   APIXABAN (ELIQUIS) 5 MG TABS TABLET    Take 5 mg by mouth 2 (two) times daily.   ASPIRIN 81 PO    Take by mouth.   BLOOD GLUCOSE MONITORING SUPPL (ONE TOUCH ULTRA 2) W/DEVICE KIT    Use to test blood sugar daily. Dx: E11.69   DILTIAZEM (DILACOR XR) 240 MG 24 HR CAPSULE    Take 240 mg by mouth daily.   FINASTERIDE (PROSCAR) 5 MG TABLET    Take 5 mg by mouth daily.   GLUCOSE BLOOD (ONETOUCH ULTRA) TEST STRIP    TEST BLOOD SUGAR ONCE DAILY   LEVOTHYROXINE (SYNTHROID) 50 MCG TABLET    TAKE 1 TABLET BY MOUTH  DAILY BEFORE BREAKFAST   LOSARTAN (COZAAR) 25 MG TABLET    Take 12.5 mg by mouth daily.  1/2 tablet=12.'5mg'$  daily   METFORMIN (GLUCOPHAGE) 500 MG TABLET    TAKE 1 TABLET BY MOUTH  TWICE DAILY TO CONTROL  DIABETES   OMEPRAZOLE (PRILOSEC) 20 MG CAPSULE    Take 1 capsule (20 mg total) by mouth daily.   ONETOUCH DELICA LANCETS FINE MISC    USE TO CHECK BLOOD SUGAR ONCE DAILY   ROSUVASTATIN (CRESTOR) 10 MG TABLET    Take 10 mg by mouth daily.   SOTALOL (BETAPACE) 80 MG TABLET    Take 80 mg by mouth 2 (two) times daily.  Modified Medications   No medications on file  Discontinued Medications   PRASUGREL (EFFIENT) 10 MG TABS TABLET    Take 10 mg by mouth daily.   ROSUVASTATIN (CRESTOR) 5 MG TABLET    TAKE 1 TABLET BY MOUTH DAILY    Physical Exam:  Vitals:    02/23/22 0846  BP: (!) 138/98  Pulse: (!) 53  Temp: (!) 96.9 F (36.1 C)  SpO2: 97%  Weight: 209 lb (94.8 kg)  Height: $Remove'5\' 3"'mqDBDhi$  (1.6 m)   Body mass index is 37.02 kg/m. Wt Readings from Last 3 Encounters:  02/23/22 209 lb (94.8 kg)  08/18/21 202 lb 12.8 oz (92 kg)  11/26/20 224 lb 9.6 oz (101.9 kg)    Physical Exam Vitals and nursing note reviewed.  Constitutional:      Appearance: Normal appearance.  Cardiovascular:     Rate and Rhythm: Regular rhythm. Bradycardia present.  Pulmonary:     Effort: Pulmonary effort is normal.     Breath sounds: Normal breath sounds.  Skin:    General: Skin is warm and dry.  Neurological:     General: No focal deficit present.     Labs reviewed: Basic Metabolic Panel: No results for input(s): "NA", "K", "CL", "CO2", "GLUCOSE", "BUN", "CREATININE", "CALCIUM", "MG", "PHOS", "TSH" in the last 8760 hours. Liver Function Tests: No results for input(s): "AST", "ALT", "ALKPHOS", "BILITOT", "PROT", "ALBUMIN" in the last 8760 hours. No results for input(s): "LIPASE", "AMYLASE" in the last 8760 hours. No results for input(s): "AMMONIA" in the last 8760 hours. CBC: No results for input(s): "WBC", "NEUTROABS", "HGB", "HCT", "MCV", "PLT" in the last 8760 hours. Lipid Panel: No results for input(s): "CHOL", "HDL", "LDLCALC", "TRIG", "CHOLHDL", "LDLDIRECT" in the last 8760 hours. TSH: No results for input(s): "TSH" in the last 8760 hours. A1C: Lab Results  Component Value Date   HGBA1C 6.0 (H) 11/26/2020     Assessment/Plan  1. Type 2 diabetes mellitus with other specified complication, without long-term current use of insulin (H[facility]) Diabetes has been well managed on metformin 500 twice daily.  He denies any complications.  Foot exam today was normal.  Eye exam is pending.  2. Primary hypertension Blood pressures are well controlled at home per patient history.  I have asked him to bring his blood pressure instrument when he returns so we can  compare.  If pressures were to be elevated could certainly increase his losartan which he takes for renal protection with diabetes  3. Atrial fibrillation, unspecified type ([place]) Likely paroxysmal since his rhythm is regular today    5. Hypothyroidism, unspecified type He does take thyroid supplement but TSH has not been checked in over a year.  Current dose is 50 mcg/day   [name], MD [facility] [phone]

## 2022-02-24 LAB — HEMOGLOBIN A1C
Hgb A1c MFr Bld: 5.9 % of total Hgb — ABNORMAL HIGH (ref <5.7)
Mean Plasma Glucose: 123 mg/dL
eAG (mmol/L): 6.8 mmol/L

## 2022-02-24 LAB — BASIC METABOLIC PANEL WITH GFR
BUN: 16 mg/dL (ref 7–25)
CO2: 24 mmol/L (ref 20–32)
Calcium: 9.9 mg/dL (ref 8.6–10.3)
Chloride: 101 mmol/L (ref 98–110)
Creat: 1.16 mg/dL (ref 0.70–1.28)
Glucose, Bld: 108 mg/dL — ABNORMAL HIGH (ref 65–99)
Potassium: 4.6 mmol/L (ref 3.5–5.3)
Sodium: 136 mmol/L (ref 135–146)
eGFR: 64 mL/min/{1.73_m2} (ref >=60)

## 2022-02-24 LAB — TSH: TSH: 2.47 mIU/L (ref 0.40–4.50)

## 2022-03-03 ENCOUNTER — Encounter: Payer: Self-pay | Admitting: Nurse Practitioner

## 2022-03-03 ENCOUNTER — Ambulatory Visit (INDEPENDENT_AMBULATORY_CARE_PROVIDER_SITE_OTHER): Payer: Medicare Other | Admitting: Nurse Practitioner

## 2022-03-03 ENCOUNTER — Telehealth: Payer: Self-pay

## 2022-03-03 DIAGNOSIS — Z0000 Encounter for general adult medical examination without abnormal findings: Secondary | ICD-10-CM | POA: Diagnosis not present

## 2022-03-03 NOTE — Progress Notes (Signed)
   This service is provided via telemedicine  No vital signs collected/recorded due to the encounter was a telemedicine visit.   Location of patient (ex: home, work):  Home  Patient consents to a telephone visit: Yes, see telephone visit dated 03/03/22  Location of the provider (ex: office, home):  [facility], Office   Name of any referring provider:  N/A  Names of all persons participating in the telemedicine service and their role in the encounter:  S.Chrae B/CMA, Sherrie Mustache, NP, and Patient   Time spent on call:  9 min with medical assistant

## 2022-03-03 NOTE — Telephone Encounter (Signed)
Mr. farris, geiman are scheduled for a virtual visit with your provider today.    Just as we do with appointments in the office, we must obtain your consent to participate.  Your consent will be active for this visit and any virtual visit you may have with one of our providers in the next 365 days.    If you have a MyChart account, I can also send a copy of this consent to you electronically.  All virtual visits are billed to your insurance company just like a traditional visit in the office.  As this is a virtual visit, video technology does not allow for your provider to perform a traditional examination.  This may limit your provider's ability to fully assess your condition.  If your provider identifies any concerns that need to be evaluated in person or the need to arrange testing such as labs, EKG, etc, we will make arrangements to do so.    Although advances in technology are sophisticated, we cannot ensure that it will always work on either your end or our end.  If the connection with a video visit is poor, we may have to switch to a telephone visit.  With either a video or telephone visit, we are not always able to ensure that we have a secure connection.   I need to obtain your verbal consent now.   Are you willing to proceed with your visit today?   LARZ MARK has provided verbal consent on 03/03/2022 for a virtual visit (video or telephone).   [facility] [place], [place] 03/03/2022  10:18 AM

## 2022-03-03 NOTE — Progress Notes (Signed)
Subjective:   Nicholas Rivera is a 81 y.o. male who presents for Medicare Annual/Subsequent preventive examination.  Review of Systems     Cardiac Risk Factors include: hypertension;dyslipidemia;diabetes mellitus;advanced age (>24men, >35 women);male gender;obesity (BMI >30kg/m2)     Objective:    There were no vitals filed for this visit. There is no height or weight on file to calculate BMI.     03/03/2022    9:56 AM 08/18/2021    8:37 AM 04/20/2021    4:37 PM 02/25/2021    8:36 AM 11/26/2020    8:30 AM 08/03/2020    9:32 AM 03/30/2020    9:41 AM  Advanced Directives  Does Patient Have a Medical Advance Directive? Yes Yes Yes Yes Yes Yes Yes  Type of Paramedic of [place];Living will Healthcare Power of [place];Living will [place];Living will [place];Living will Healthcare Power of [place]  Does patient want to make changes to medical advance directive? No - Patient declined No - Patient declined No - Patient declined No - Patient declined No - Patient declined No - Patient declined No - Patient declined  Copy of [place] in Chart? Yes - validated most recent copy scanned in chart (See row information) Yes - validated most recent copy scanned in chart (See row information) Yes - validated most recent copy scanned in chart (See row information) Yes - validated most recent copy scanned in chart (See row information) Yes - validated most recent copy scanned in chart (See row information)      Current Medications (verified) Outpatient Encounter Medications as of 03/03/2022  Medication Sig   allopurinol (ZYLOPRIM) 100 MG tablet Take 1 tablet (100 mg total) by mouth daily.   apixaban (ELIQUIS) 5 MG TABS tablet Take 5 mg by mouth 2 (two) times daily.   ASPIRIN 81 PO Take by mouth.   Blood Glucose Monitoring Suppl (ONE TOUCH ULTRA 2) w/Device  KIT Use to test blood sugar daily. Dx: E11.69   diltiazem (DILACOR XR) 240 MG 24 hr capsule Take 240 mg by mouth daily.   finasteride (PROSCAR) 5 MG tablet Take 5 mg by mouth daily.   glucose blood (ONETOUCH ULTRA) test strip TEST BLOOD SUGAR ONCE DAILY   levothyroxine (SYNTHROID) 50 MCG tablet TAKE 1 TABLET BY MOUTH  DAILY BEFORE BREAKFAST   losartan (COZAAR) 25 MG tablet Take 12.5 mg by mouth daily.   metFORMIN (GLUCOPHAGE) 500 MG tablet TAKE 1 TABLET BY MOUTH  TWICE DAILY TO CONTROL  DIABETES   omeprazole (PRILOSEC) 20 MG capsule Take 1 capsule (20 mg total) by mouth daily.   ONETOUCH DELICA LANCETS FINE MISC USE TO CHECK BLOOD SUGAR ONCE DAILY   rosuvastatin (CRESTOR) 10 MG tablet Take 10 mg by mouth daily.   sotalol (BETAPACE) 80 MG tablet Take 80 mg by mouth 2 (two) times daily.   No facility-administered encounter medications on file as of 03/03/2022.    Allergies (verified) Patient has no known allergies.   History: Past Medical History:  Diagnosis Date   Atrial fibrillation ([place])    Back pain, lumbosacral 09/05/2012   CAD (coronary artery disease), native coronary artery    Cancer of kidney ([place]) 09/05/2010   Cataract 09/06/2011   Diabetes mellitus without complication ([place]) 90/30/0923   Hypertension 09/05/2010   Nephrolithiasis 09/05/2010   Obesity, unspecified    Other and unspecified hyperlipidemia 04/03/2013   Pain, joint, ankle  and foot    Unspecified hypothyroidism 09/05/2010   Past Surgical History:  Procedure Laterality Date   ANKLE SURGERY     CATARACT EXTRACTION W/ INTRAOCULAR LENS IMPLANT Bilateral 03/05/2016   Dr. [name]   CORONARY ANGIOPLASTY WITH STENT PLACEMENT     CYST REMOVAL TRUNK  11/03/2017   KIDNEY SURGERY  12/2010   had cancer removed; Dr. [name] CUFF REPAIR     bilateral; Dr. [name]   History reviewed. No pertinent family history. Social History   Socioeconomic History   Marital status: Married    Spouse name: Not on file    Number of children: Not on file   Years of education: Not on file   Highest education level: Not on file  Occupational History   Occupation: Retired  Tobacco Use   Smoking status: Never   Smokeless tobacco: Never  Vaping Use   Vaping Use: Never used  Substance and Sexual Activity   Alcohol use: No   Drug use: No   Sexual activity: Yes  Other Topics Concern   Not on file  Social History Narrative   Patient was previously married. His wife has died from cancer.   Remarried to Ronalee Belts   Never smoked   Alcohol none   Exercise 30 minutes daily   POA   Social Determinants of Health   Financial Resource Strain: Low Risk  (02/12/2018)   Overall Financial Resource Strain (CARDIA)    Difficulty of Paying Living Expenses: Not hard at all  Food Insecurity: No Food Insecurity (02/12/2018)   Hunger Vital Sign    Worried About Running Out of Food in the Last Year: Never true    Ran Out of Food in the Last Year: Never true  Transportation Needs: No Transportation Needs (02/12/2018)   PRAPARE - Hydrologist (Medical): No    Lack of Transportation (Non-Medical): No  Physical Activity: Sufficiently Active (02/12/2018)   Exercise Vital Sign    Days of Exercise per Week: 7 days    Minutes of Exercise per Session: 30 min  Stress: No Stress Concern Present (02/12/2018)   [place]    Feeling of Stress : Only a little  Social Connections: Moderately Integrated (02/12/2018)   Social Connection and Isolation Panel [NHANES]    Frequency of Communication with Friends and Family: More than three times a week    Frequency of Social Gatherings with Friends and Family: More than three times a week    Attends Religious Services: More than 4 times per year    Active Member of [employer] or Organizations: No    Attends Archivist Meetings: Never    Marital Status: Married    Tobacco Counseling Counseling  given: Not Answered   Clinical Intake:                 Diabetic?yes         Activities of Daily Living    03/03/2022   10:53 AM  In your present state of health, do you have any difficulty performing the following activities:  Hearing? 0  Vision? 0  Difficulty concentrating or making decisions? 0  Walking or climbing stairs? 0  Dressing or bathing? 0  Doing errands, shopping? 0  Preparing Food and eating ? N  Using the Toilet? N  In the past six months, have you accidently leaked urine? N  Do you have problems with loss of  bowel control? N  Managing your Medications? N  Managing your Finances? N  Housekeeping or managing your Housekeeping? N    Patient Care Team: [name], MD as PCP - General (Family Medicine) [name], MD as Consulting Physician (Urology) [name], MD as Consulting Physician (Ophthalmology) [name], MD as Consulting Physician (Ophthalmology)  Indicate any recent Medical Services you may have received from other than [facility] providers in the past year (date may be approximate).     Assessment:   This is a routine wellness examination for [place].  Hearing/Vision screen Hearing Screening - Comments:: Slight hearing decline with age, nothing significant  Vision Screening - Comments:: Last eye examine greater than 12 months ago. Pending appointment for 08/2022, at Dr.Digbys office with one of his counter partners   Dietary issues and exercise activities discussed: Current Exercise Habits: Home exercise routine, Type of exercise: walking, Time (Minutes): 30, Frequency (Times/Week): 7, Weekly Exercise (Minutes/Week): 210   Goals Addressed   None    Depression Screen    03/03/2022    9:57 AM 02/25/2021    8:35 AM 11/26/2020    8:29 AM 02/25/2020    8:43 AM 08/23/2019    1:22 PM 07/22/2019    9:37 AM 06/12/2019    1:11 PM  PHQ 2/9 Scores  PHQ - 2 Score 0 0 0 0 0 0 0    Fall Risk    03/03/2022    9:57 AM  02/23/2022    8:54 AM 08/18/2021    8:36 AM 04/21/2021    8:35 AM 02/25/2021    8:33 AM  Fall Risk   Falls in the past year? 0 0 1 1 1   Number falls in past yr: 0 0 1 1 0  Injury with Fall? 0 0 1 1 0  Risk for fall due to : No Fall Risks No Fall Risks History of fall(s) History of fall(s) Impaired balance/gait  Follow up Falls evaluation completed Falls evaluation completed Falls evaluation completed Falls evaluation completed Falls evaluation completed    [place]:  Any stairs in or around the home? Yes  If so, are there any without handrails? Yes  Home free of loose throw rugs in walkways, pet beds, electrical cords, etc? Yes  Adequate lighting in your home to reduce risk of falls? Yes   ASSISTIVE DEVICES UTILIZED TO PREVENT FALLS:  Life alert? No  Use of a cane, walker or w/c? No  Grab bars in the bathroom? No  Shower chair or bench in shower? Yes  Elevated toilet seat or a handicapped toilet? No   TIMED UP AND GO:  Was the test performed? No .   Cognitive Function:    02/12/2018   10:23 AM 01/06/2017   10:54 AM 04/05/2016    3:00 PM 06/04/2014    1:41 PM  MMSE - Mini Mental State Exam  Orientation to time 5 5 5 5   Orientation to Place 5 5 5 5   Registration 3 3 3 3   Attention/ Calculation 4 5 5 5   Recall 2 3 2 3   Language- name 2 objects 2 2 2 2   Language- repeat 1 1 1 1   Language- follow 3 step command 3 3 3 3   Language- read & follow direction 1 1 1 1   Write a sentence 1 1 1 1   Copy design 1 1 1 1   Total score 28 30 29 30         03/03/2022  10:11 AM 02/25/2021    8:36 AM 02/25/2020    8:25 AM 02/15/2019    9:07 AM  6CIT Screen  What Year? 0 points 0 points 0 points 0 points  What month? 0 points 0 points 0 points 0 points  What time? 0 points 0 points 0 points 0 points  Count back from 20 2 points 0 points 0 points 0 points  Months in reverse 0 points 0 points 0 points 0 points  Repeat phrase 2 points 0 points 0 points 0  points  Total Score 4 points 0 points 0 points 0 points    Immunizations Immunization History  Administered Date(s) Administered   Fluad Quad(high Dose 65+) 06/30/2021   Influenza, High Dose Seasonal PF 06/20/2017, 06/02/2018, 04/30/2019, 06/28/2020   Influenza,inj,Quad PF,6+ Mos 05/13/2015   Influenza-Unspecified 06/05/2013, 06/20/2014, 05/30/2016   PFIZER(Purple Top)SARS-COV-2 Vaccination 09/22/2019, 10/13/2019, 06/14/2020, 12/28/2020, 07/06/2021   Pneumococcal Conjugate-13 06/04/2014   Pneumococcal Polysaccharide-23 09/05/1998, 08/03/2020   Td 09/06/2003, 10/16/2017    TDAP status: Up to date  Flu Vaccine status: Up to date  Pneumococcal vaccine status: Up to date  Covid-19 vaccine status: Information provided on how to obtain vaccines.   Qualifies for Shingles Vaccine? Yes   Zostavax completed No   Shingrix Completed?: No.    Education has been provided regarding the importance of this vaccine. Patient has been advised to call insurance company to determine out of pocket expense if they have not yet received this vaccine. Advised may also receive vaccine at local pharmacy or Health Dept. Verbalized acceptance and understanding.  Screening Tests Health Maintenance  Topic Date Due   Zoster Vaccines- Shingrix (1 of 2) Never done   OPHTHALMOLOGY EXAM  01/23/2019   COVID-19 Vaccine (6 - Booster for Pfizer series) 08/31/2021   INFLUENZA VACCINE  04/05/2022   HEMOGLOBIN A1C  08/25/2022   FOOT EXAM  02/24/2023   TETANUS/TDAP  10/17/2027   Pneumonia Vaccine 27+ Years old  Completed   Hepatitis C Screening  Completed   HPV VACCINES  Aged Out    Health Maintenance  Health Maintenance Due  Topic Date Due   Zoster Vaccines- Shingrix (1 of 2) Never done   OPHTHALMOLOGY EXAM  01/23/2019   COVID-19 Vaccine (6 - Booster for Pfizer series) 08/31/2021    Colorectal cancer screening: No longer required.   Lung Cancer Screening: (Low Dose CT Chest recommended if Age 59-80 years,  30 pack-year currently smoking OR have quit w/in 15years.) does not qualify.   Lung Cancer Screening Referral: na  Additional Screening:  Hepatitis C Screening: does qualify; Completed   Vision Screening: Recommended annual ophthalmology exams for early detection of glaucoma and other disorders of the eye. Is the patient up to date with their annual eye exam?  Yes  Who is the provider or what is the name of the office in which the patient attends annual eye exams? DR Eulas Post If pt is not established with a provider, would they like to be referred to a provider to establish care? No .   Dental Screening: Recommended annual dental exams for proper oral hygiene  Community Resource Referral / Chronic Care Management: CRR required this visit?  No   CCM required this visit?  No      Plan:     I have personally reviewed and noted the following in the patient's chart:   Medical and social history Use of alcohol, tobacco or illicit drugs  Current medications and supplements including opioid prescriptions. Patient  is not currently taking opioid prescriptions. Functional ability and status Nutritional status Physical activity Advanced directives List of other physicians Hospitalizations, surgeries, and ER visits in previous 12 months Vitals Screenings to include cognitive, depression, and falls Referrals and appointments  In addition, I have reviewed and discussed with patient certain preventive protocols, quality metrics, and best practice recommendations. A written personalized care plan for preventive services as well as general preventive health recommendations were provided to patient.     Lauree Chandler, NP   03/03/2022    Virtual Visit via Telephone Note  I connected with patient 03/03/22 at 10:40 AM EDT by telephone and verified that I am speaking with the correct person using two identifiers.  Location: Patient: home Provider: [facility]    I discussed the  limitations, risks, security and privacy concerns of performing an evaluation and management service by telephone and the availability of in person appointments. I also discussed with the patient that there may be a patient responsible charge related to this service. The patient expressed understanding and agreed to proceed.   I discussed the assessment and treatment plan with the patient. The patient was provided an opportunity to ask questions and all were answered. The patient agreed with the plan and demonstrated an understanding of the instructions.   The patient was advised to call back or seek an in-person evaluation if the symptoms worsen or if the condition fails to improve as anticipated.  I provided 14 minutes of non-face-to-face time during this encounter.  Carlos [employer]. Dewaine Oats, AGNP Avs printed and mailed \

## 2022-03-03 NOTE — Patient Instructions (Signed)
[name] , Thank you for taking time to come for your Medicare Wellness Visit. I appreciate your ongoing commitment to your health goals. Please review the following plan we discussed and let me know if I can assist you in the future.   Screening recommendations/referrals: Colonoscopy aged out Recommended yearly ophthalmology/optometry visit for glaucoma screening and checkup Recommended yearly dental visit for hygiene and checkup  Vaccinations: Influenza vaccine yearly in September  Pneumococcal vaccine up to date Tdap vaccine up to date Shingles vaccine recommended to get at local pharmacy     Advanced directives: on file.   Conditions/risks identified: advanced age, CAD, diabetes, htn, hyperlipidemia.   Next appointment: yearly for AWV  Preventive Care 79 Years and Older, Male Preventive care refers to lifestyle choices and visits with your health care provider that can promote health and wellness. What does preventive care include? A yearly physical exam. This is also called an annual well check. Dental exams once or twice a year. Routine eye exams. Ask your health care provider how often you should have your eyes checked. Personal lifestyle choices, including: Daily care of your teeth and gums. Regular physical activity. Eating a healthy diet. Avoiding tobacco and drug use. Limiting alcohol use. Practicing safe sex. Taking low doses of aspirin every day. Taking vitamin and mineral supplements as recommended by your health care provider. What happens during an annual well check? The services and screenings done by your health care provider during your annual well check will depend on your age, overall health, lifestyle risk factors, and family history of disease. Counseling  Your health care provider may ask you questions about your: Alcohol use. Tobacco use. Drug use. Emotional well-being. Home and relationship well-being. Sexual activity. Eating habits. History of  falls. Memory and ability to understand (cognition). Work and work Statistician. Screening  You may have the following tests or measurements: Height, weight, and BMI. Blood pressure. Lipid and cholesterol levels. These may be checked every 5 years, or more frequently if you are over 60 years old. Skin check. Lung cancer screening. You may have this screening every year starting at age 9 if you have a 30-pack-year history of smoking and currently smoke or have quit within the past 15 years. Fecal occult blood test (FOBT) of the stool. You may have this test every year starting at age 58. Flexible sigmoidoscopy or colonoscopy. You may have a sigmoidoscopy every 5 years or a colonoscopy every 10 years starting at age 22. Prostate cancer screening. Recommendations will vary depending on your family history and other risks. Hepatitis C blood test. Hepatitis B blood test. Sexually transmitted disease (STD) testing. Diabetes screening. This is done by checking your blood sugar (glucose) after you have not eaten for a while (fasting). You may have this done every 1-3 years. Abdominal aortic aneurysm (AAA) screening. You may need this if you are a current or former smoker. Osteoporosis. You may be screened starting at age 36 if you are at high risk. Talk with your health care provider about your test results, treatment options, and if necessary, the need for more tests. Vaccines  Your health care provider may recommend certain vaccines, such as: Influenza vaccine. This is recommended every year. Tetanus, diphtheria, and acellular pertussis (Tdap, Td) vaccine. You may need a Td booster every 10 years. Zoster vaccine. You may need this after age 61. Pneumococcal 13-valent conjugate (PCV13) vaccine. One dose is recommended after age 43. Pneumococcal polysaccharide (PPSV23) vaccine. One dose is recommended after age 12.  Talk to your health care provider about which screenings and vaccines you need and  how often you need them. This information is not intended to replace advice given to you by your health care provider. Make sure you discuss any questions you have with your health care provider. Document Released: 09/18/2015 Document Revised: 05/11/2016 Document Reviewed: 06/23/2015 Elsevier Interactive Patient Education  2017 [place] Prevention in the Home Falls can cause injuries. They can happen to people of all ages. There are many things you can do to make your home safe and to help prevent falls. What can I do on the outside of my home? Regularly fix the edges of walkways and driveways and fix any cracks. Remove anything that might make you trip as you walk through a door, such as a raised step or threshold. Trim any bushes or trees on the path to your home. Use bright outdoor lighting. Clear any walking paths of anything that might make someone trip, such as rocks or tools. Regularly check to see if handrails are loose or broken. Make sure that both sides of any steps have handrails. Any raised decks and porches should have guardrails on the edges. Have any leaves, snow, or ice cleared regularly. Use sand or salt on walking paths during winter. Clean up any spills in your garage right away. This includes oil or grease spills. What can I do in the bathroom? Use night lights. Install grab bars by the toilet and in the tub and shower. Do not use towel bars as grab bars. Use non-skid mats or decals in the tub or shower. If you need to sit down in the shower, use a plastic, non-slip stool. Keep the floor dry. Clean up any water that spills on the floor as soon as it happens. Remove soap buildup in the tub or shower regularly. Attach bath mats securely with double-sided non-slip rug tape. Do not have throw rugs and other things on the floor that can make you trip. What can I do in the bedroom? Use night lights. Make sure that you have a light by your bed that is easy to  reach. Do not use any sheets or blankets that are too big for your bed. They should not hang down onto the floor. Have a firm chair that has side arms. You can use this for support while you get dressed. Do not have throw rugs and other things on the floor that can make you trip. What can I do in the kitchen? Clean up any spills right away. Avoid walking on wet floors. Keep items that you use a lot in easy-to-reach places. If you need to reach something above you, use a strong step stool that has a grab bar. Keep electrical cords out of the way. Do not use floor polish or wax that makes floors slippery. If you must use wax, use non-skid floor wax. Do not have throw rugs and other things on the floor that can make you trip. What can I do with my stairs? Do not leave any items on the stairs. Make sure that there are handrails on both sides of the stairs and use them. Fix handrails that are broken or loose. Make sure that handrails are as long as the stairways. Check any carpeting to make sure that it is firmly attached to the stairs. Fix any carpet that is loose or worn. Avoid having throw rugs at the top or bottom of the stairs. If you do have throw  rugs, attach them to the floor with carpet tape. Make sure that you have a light switch at the top of the stairs and the bottom of the stairs. If you do not have them, ask someone to add them for you. What else can I do to help prevent falls? Wear shoes that: Do not have high heels. Have rubber bottoms. Are comfortable and fit you well. Are closed at the toe. Do not wear sandals. If you use a stepladder: Make sure that it is fully opened. Do not climb a closed stepladder. Make sure that both sides of the stepladder are locked into place. Ask someone to hold it for you, if possible. Clearly mark and make sure that you can see: Any grab bars or handrails. First and last steps. Where the edge of each step is. Use tools that help you move  around (mobility aids) if they are needed. These include: Canes. Walkers. Scooters. Crutches. Turn on the lights when you go into a dark area. Replace any light bulbs as soon as they burn out. Set up your furniture so you have a clear path. Avoid moving your furniture around. If any of your floors are uneven, fix them. If there are any pets around you, be aware of where they are. Review your medicines with your doctor. Some medicines can make you feel dizzy. This can increase your chance of falling. Ask your doctor what other things that you can do to help prevent falls. This information is not intended to replace advice given to you by your health care provider. Make sure you discuss any questions you have with your health care provider. Document Released: 06/18/2009 Document Revised: 01/28/2016 Document Reviewed: 09/26/2014 Elsevier Interactive Patient Education  2017 [employer].

## 2022-05-13 ENCOUNTER — Other Ambulatory Visit: Payer: Self-pay | Admitting: Family Medicine

## 2022-08-10 ENCOUNTER — Ambulatory Visit (INDEPENDENT_AMBULATORY_CARE_PROVIDER_SITE_OTHER): Payer: Medicare Other | Admitting: Family Medicine

## 2022-08-10 ENCOUNTER — Encounter: Payer: Self-pay | Admitting: Family Medicine

## 2022-08-10 VITALS — BP 134/86 | HR 48 | Temp 97.4°F | Ht 63.0 in | Wt 213.0 lb

## 2022-08-10 DIAGNOSIS — M546 Pain in thoracic spine: Secondary | ICD-10-CM

## 2022-08-10 DIAGNOSIS — M549 Dorsalgia, unspecified: Secondary | ICD-10-CM | POA: Insufficient documentation

## 2022-08-10 MED ORDER — CYCLOBENZAPRINE HCL 5 MG PO TABS: 5.0000 mg | ORAL_TABLET | Freq: Three times a day (TID) | ORAL | 1 refills | Status: DC | PRN

## 2022-08-10 NOTE — Progress Notes (Signed)
Provider:  [name], MD  Careteam: Patient Care Team: [name], MD as PCP - General (Family Medicine) [name], MD as Consulting Physician (Urology) [name], MD as Consulting Physician (Ophthalmology) [name], MD as Consulting Physician (Ophthalmology)  PLACE OF SERVICE:  [place] Directive information    No Known Allergies  Chief Complaint  Patient presents with   Acute Visit    Patient presents today for a back and neck pain that occurred after a fall on 07/28/22.He reports using OTC muscle relaxer ointment for the pain. He states it helped with the neck pain but not the back pain.     HPI: Patient is a 81 y.o. male .  Patient had a fall about 2 weeks ago.  He was going down a wet ramp and his feet slipped out from under him and he landed on his back.  Initially neck also hurt.  Pain is in the mid upper back.  No history of similar back issues.  Neck has improved but back is still hurting he has been using some muscle relaxant ointment.  Should be noted that pain did not occur immediately as might happen with a fracture but was more over the next 12 to 24 hours suggestive of soft tissue and muscular pain  Review of Systems:  Review of Systems  Musculoskeletal:  Positive for back pain, myalgias and neck pain.  All other systems reviewed and are negative.   Past Medical History:  Diagnosis Date   Atrial fibrillation ([place])    Back pain, lumbosacral 09/05/2012   CAD (coronary artery disease), native coronary artery    Cancer of kidney ([place]) 09/05/2010   Cataract 09/06/2011   Diabetes mellitus without complication ([place]) 16/06/9603   Hypertension 09/05/2010   Nephrolithiasis 09/05/2010   Obesity, unspecified    Other and unspecified hyperlipidemia 04/03/2013   Pain, joint, ankle and foot    Unspecified hypothyroidism 09/05/2010   Past Surgical History:  Procedure Laterality Date   ANKLE SURGERY     CATARACT EXTRACTION  W/ INTRAOCULAR LENS IMPLANT Bilateral 03/05/2016   Dr. [name]   CORONARY ANGIOPLASTY WITH STENT PLACEMENT     CYST REMOVAL TRUNK  11/03/2017   KIDNEY SURGERY  12/2010   had cancer removed; Dr. [name] CUFF REPAIR     bilateral; Dr. [name]   Social History:   reports that he has never smoked. He has never used smokeless tobacco. He reports that he does not drink alcohol and does not use drugs.  History reviewed. No pertinent family history.  Medications: Patient's Medications  New Prescriptions   No medications on file  Previous Medications   ALLOPURINOL (ZYLOPRIM) 100 MG TABLET    Take 1 tablet (100 mg total) by mouth daily.   APIXABAN (ELIQUIS) 5 MG TABS TABLET    Take 5 mg by mouth 2 (two) times daily.   ASPIRIN 81 PO    Take by mouth.   BLOOD GLUCOSE MONITORING SUPPL (ONE TOUCH ULTRA 2) W/DEVICE KIT    Use to test blood sugar daily. Dx: E11.69   DILTIAZEM (DILACOR XR) 240 MG 24 HR CAPSULE    Take 240 mg by mouth daily.   FINASTERIDE (PROSCAR) 5 MG TABLET    Take 5 mg by mouth daily.   GLUCOSE BLOOD (ONETOUCH ULTRA) TEST STRIP    TEST BLOOD SUGAR ONCE DAILY   LEVOTHYROXINE (SYNTHROID) 50 MCG TABLET    TAKE 1 TABLET BY MOUTH  DAILY  BEFORE BREAKFAST   LOSARTAN (COZAAR) 25 MG TABLET    Take 12.5 mg by mouth daily.   METFORMIN (GLUCOPHAGE) 500 MG TABLET    TAKE 1 TABLET BY MOUTH  TWICE DAILY TO CONTROL  DIABETES   OMEPRAZOLE (PRILOSEC) 20 MG CAPSULE    TAKE 1 CAPSULE BY MOUTH DAILY   ONETOUCH DELICA LANCETS FINE MISC    USE TO CHECK BLOOD SUGAR ONCE DAILY   ROSUVASTATIN (CRESTOR) 10 MG TABLET    Take 10 mg by mouth daily.   SOTALOL (BETAPACE) 80 MG TABLET    Take 80 mg by mouth 2 (two) times daily.  Modified Medications   No medications on file  Discontinued Medications   No medications on file    Physical Exam:  Vitals:   08/10/22 1049  BP: 134/86  Pulse: (!) 48  Temp: (!) 97.4 F (36.3 C)  SpO2: 98%  Weight: 213 lb (96.6 kg)  Height: _0  (1.6 m)   Body  mass index is 37.73 kg/m. Wt Readings from Last 3 Encounters:  08/10/22 213 lb (96.6 kg)  02/23/22 209 lb (94.8 kg)  08/18/21 202 lb 12.8 oz (92 kg)    Physical Exam Vitals and nursing note reviewed.  Musculoskeletal:     Cervical back: Normal range of motion.     Comments: Normal flexion and extension.  No tenderness with compression of ribs.  Pain seems to be in the latissimus area of his left upper and mid back     Labs reviewed: Basic Metabolic Panel: Recent Labs    02/23/22 0930  NA 136  K 4.6  CL 101  CO2 24  GLUCOSE 108*  BUN 16  CREATININE 1.16  CALCIUM 9.9  TSH 2.47   Liver Function Tests: No results for input(s): "AST", "ALT", "ALKPHOS", "BILITOT", "PROT", "ALBUMIN" in the last 8760 hours. No results for input(s): "LIPASE", "AMYLASE" in the last 8760 hours. No results for input(s): "AMMONIA" in the last 8760 hours. CBC: No results for input(s): "WBC", "NEUTROABS", "HGB", "HCT", "MCV", "PLT" in the last 8760 hours. Lipid Panel: No results for input(s): "CHOL", "HDL", "LDLCALC", "TRIG", "CHOLHDL", "LDLDIRECT" in the last 8760 hours. TSH: Recent Labs    02/23/22 0930  TSH 2.47   A1C: Lab Results  Component Value Date   HGBA1C 5.9 (H) 02/23/2022     Assessment/Plan  1. Acute left-sided thoracic back pain I believe his pain is primarily muscular would like to try some application of heat as well as muscle relaxant and give it more time. If symptoms do not improve in 2 weeks or so consider x-ray   [name], MD [place] [phone]

## 2022-08-24 DIAGNOSIS — H348322 Tributary (branch) retinal vein occlusion, left eye, stable: Secondary | ICD-10-CM | POA: Diagnosis not present

## 2022-08-24 DIAGNOSIS — Z961 Presence of intraocular lens: Secondary | ICD-10-CM | POA: Diagnosis not present

## 2022-08-24 DIAGNOSIS — H26493 Other secondary cataract, bilateral: Secondary | ICD-10-CM | POA: Diagnosis not present

## 2022-08-24 DIAGNOSIS — E119 Type 2 diabetes mellitus without complications: Secondary | ICD-10-CM | POA: Diagnosis not present

## 2022-08-24 LAB — HM DIABETES EYE EXAM

## 2022-08-30 ENCOUNTER — Ambulatory Visit: Payer: Medicare Other | Admitting: Family Medicine

## 2022-08-31 ENCOUNTER — Ambulatory Visit: Payer: Medicare Other | Admitting: Family Medicine

## 2022-11-14 ENCOUNTER — Encounter: Payer: Self-pay | Admitting: Nurse Practitioner

## 2022-11-14 ENCOUNTER — Ambulatory Visit (INDEPENDENT_AMBULATORY_CARE_PROVIDER_SITE_OTHER): Payer: Medicare Other | Admitting: Nurse Practitioner

## 2022-11-14 VITALS — BP 134/82 | HR 84 | Temp 97.7°F | Ht 63.0 in | Wt 211.0 lb

## 2022-11-14 DIAGNOSIS — J069 Acute upper respiratory infection, unspecified: Secondary | ICD-10-CM

## 2022-11-14 LAB — POC COVID19 BINAXNOW: SARS Coronavirus 2 Ag: NEGATIVE

## 2022-11-14 NOTE — Progress Notes (Signed)
Careteam: Patient Care Team: [name], MD as PCP - General (Family Medicine) [name], MD as Consulting Physician (Urology) [name], MD as Consulting Physician (Ophthalmology) [name], MD as Consulting Physician (Ophthalmology)  PLACE OF SERVICE:  [place] Directive information Does Patient Have a Medical Advance Directive?: Yes, Type of Advance Directive: [place];Living will, Does patient want to make changes to medical advance directive?: No - Patient declined  No Known Allergies  Chief Complaint  Patient presents with   Acute Visit    Runny nose(blood present) and coughing since Friday. Patient did not perform an at home covid test     HPI: Patient is a 82 y.o. male due to cough and runny nose for 3 days.  3 days ago started with runny nose- has allergies but flowing constantly.  Cough from post nasal drip.  Had sore throat but now better.  Son recommended water, salt, pepper and vingar tablespoon full - he could not tolerate the pepper made his throat burn.  Coughing up some green sputum Having blood tinged drainage at times due to blowing his nose so much.  No fevers or chills.   Review of Systems:  Review of Systems  Constitutional:  Negative for chills, fever and weight loss.  HENT:  Positive for congestion and sore throat. Negative for tinnitus.   Respiratory:  Negative for cough, sputum production and shortness of breath.   Cardiovascular:  Negative for chest pain, palpitations and leg swelling.  Gastrointestinal:  Negative for abdominal pain, constipation, diarrhea and heartburn.  Genitourinary:  Negative for dysuria, frequency and urgency.  Musculoskeletal:  Negative for back pain, falls, joint pain and myalgias.  Skin: Negative.   Neurological:  Negative for dizziness and headaches.  Psychiatric/Behavioral:  Negative for depression and memory loss. The patient does not have insomnia.     Past  Medical History:  Diagnosis Date   Atrial fibrillation ([place])    Back pain, lumbosacral 09/05/2012   CAD (coronary artery disease), native coronary artery    Cancer of kidney ([place]) 09/05/2010   Cataract 09/06/2011   Diabetes mellitus without complication ([place]) [id number]   Hypertension 09/05/2010   Nephrolithiasis 09/05/2010   Obesity, unspecified    Other and unspecified hyperlipidemia 04/03/2013   Pain, joint, ankle and foot    Unspecified hypothyroidism 09/05/2010   Past Surgical History:  Procedure Laterality Date   ANKLE SURGERY     CATARACT EXTRACTION W/ INTRAOCULAR LENS IMPLANT Bilateral 03/05/2016   Dr. [name]   CORONARY ANGIOPLASTY WITH STENT PLACEMENT     CYST REMOVAL TRUNK  11/03/2017   KIDNEY SURGERY  12/2010   had cancer removed; Dr. [name] CUFF REPAIR     bilateral; Dr. [name]   Social History:   reports that he has never smoked. He has never used smokeless tobacco. He reports that he does not drink alcohol and does not use drugs.  History reviewed. No pertinent family history.  Medications: Patient's Medications  New Prescriptions   No medications on file  Previous Medications   APIXABAN (ELIQUIS) 5 MG TABS TABLET    Take 5 mg by mouth 2 (two) times daily.   ASPIRIN 81 PO    Take by mouth.   BLOOD GLUCOSE MONITORING SUPPL (ONE TOUCH ULTRA 2) W/DEVICE KIT    Use to test blood sugar daily. Dx: E11.69   DILTIAZEM (DILACOR XR) 240 MG 24 HR CAPSULE    Take 240 mg by mouth  daily.   FINASTERIDE (PROSCAR) 5 MG TABLET    Take 5 mg by mouth daily.   GLUCOSE BLOOD (ONETOUCH ULTRA) TEST STRIP    TEST BLOOD SUGAR ONCE DAILY   LEVOTHYROXINE (SYNTHROID) 50 MCG TABLET    TAKE 1 TABLET BY MOUTH  DAILY BEFORE BREAKFAST   LOSARTAN (COZAAR) 25 MG TABLET    Take 12.5 mg by mouth daily.   METFORMIN (GLUCOPHAGE) 500 MG TABLET    TAKE 1 TABLET BY MOUTH  TWICE DAILY TO CONTROL  DIABETES   OMEPRAZOLE (PRILOSEC) 20 MG CAPSULE    TAKE 1 CAPSULE BY MOUTH DAILY   ONETOUCH  DELICA LANCETS FINE MISC    USE TO CHECK BLOOD SUGAR ONCE DAILY   ROSUVASTATIN (CRESTOR) 10 MG TABLET    Take 10 mg by mouth daily.   SOTALOL (BETAPACE) 80 MG TABLET    Take 80 mg by mouth 2 (two) times daily.  Modified Medications   No medications on file  Discontinued Medications   ALLOPURINOL (ZYLOPRIM) 100 MG TABLET    Take 1 tablet (100 mg total) by mouth daily.   CYCLOBENZAPRINE (FLEXERIL) 5 MG TABLET    Take 1 tablet (5 mg total) by mouth 3 (three) times daily as needed for muscle spasms.    Physical Exam:  Vitals:   11/14/22 1441  BP: 134/82  Pulse: 84  Temp: 97.7 F (36.5 C)  SpO2: 97%  Weight: 211 lb (95.7 kg)  Height: '5\' 3"'$  (1.6 m)   Body mass index is 37.38 kg/m. Wt Readings from Last 3 Encounters:  11/14/22 211 lb (95.7 kg)  08/10/22 213 lb (96.6 kg)  02/23/22 209 lb (94.8 kg)    Physical Exam Constitutional:      General: He is not in acute distress.    Appearance: He is well-developed. He is not diaphoretic.  HENT:     Head: Normocephalic and atraumatic.     Right Ear: External ear normal.     Left Ear: External ear normal.     Nose: Congestion and rhinorrhea present.     Mouth/Throat:     Pharynx: No oropharyngeal exudate.  Eyes:     Conjunctiva/sclera: Conjunctivae normal.     Pupils: Pupils are equal, round, and reactive to light.  Cardiovascular:     Rate and Rhythm: Normal rate and regular rhythm.     Heart sounds: Normal heart sounds.  Pulmonary:     Effort: Pulmonary effort is normal.     Breath sounds: Normal breath sounds.  Abdominal:     General: Bowel sounds are normal.     Palpations: Abdomen is soft.  Musculoskeletal:        General: No tenderness.     Cervical back: Normal range of motion and neck supple.     Right lower leg: No edema.     Left lower leg: No edema.  Skin:    General: Skin is warm and dry.  Neurological:     Mental Status: He is alert and oriented to person, place, and time.     Labs reviewed: Basic  Metabolic Panel: Recent Labs    02/23/22 0930  NA 136  K 4.6  CL 101  CO2 24  GLUCOSE 108*  BUN 16  CREATININE 1.16  CALCIUM 9.9  TSH 2.47   Liver Function Tests: No results for input(s): "AST", "ALT", "ALKPHOS", "BILITOT", "PROT", "ALBUMIN" in the last 8760 hours. No results for input(s): "LIPASE", "AMYLASE" in the last 8760 hours. No results for  input(s): "AMMONIA" in the last 8760 hours. CBC: No results for input(s): "WBC", "NEUTROABS", "HGB", "HCT", "MCV", "PLT" in the last 8760 hours. Lipid Panel: No results for input(s): "CHOL", "HDL", "LDLCALC", "TRIG", "CHOLHDL", "LDLDIRECT" in the last 8760 hours. TSH: Recent Labs    02/23/22 0930  TSH 2.47   A1C: Lab Results  Component Value Date   HGBA1C 5.9 (H) 02/23/2022     Assessment/Plan 1. Upper respiratory tract infection, unspecified type -supportive care at this time Netipot or saline wash daily Plain nasal saline spray throughout the day as needed May use tylenol 325 mg 2 tablets every 6-8 hours as needed aches and pains or sore throat humidifier in the home to help with the dry air Mucinex DM by mouth twice daily as needed for cough and chest congestion with full glass of water  Keep well hydrated Proper nutrition  Avoid forcefully blowing nose Vit c 1000 mg daily Vit d 2000 units daily  - POC COVID-19 negative -follow up precautions given.    Carlos American. [place], [facility] [phone]

## 2022-11-14 NOTE — Patient Instructions (Addendum)
Netipot or saline wash daily Plain nasal saline spray throughout the day as needed May use tylenol 325 mg 2 tablets every 6-8 hours as needed aches and pains or sore throat humidifier in the home to help with the dry air Mucinex DM by mouth twice daily as needed for cough and chest congestion with full glass of water  Keep well hydrated Avoid forcefully blowing nose Vit c 1000 mg daily Vit d 2000 units daily

## 2022-11-17 ENCOUNTER — Other Ambulatory Visit: Payer: Self-pay | Admitting: Family Medicine

## 2022-11-17 DIAGNOSIS — E039 Hypothyroidism, unspecified: Secondary | ICD-10-CM

## 2022-11-22 DIAGNOSIS — R350 Frequency of micturition: Secondary | ICD-10-CM | POA: Diagnosis not present

## 2022-11-22 DIAGNOSIS — N210 Calculus in bladder: Secondary | ICD-10-CM | POA: Diagnosis not present

## 2022-11-22 DIAGNOSIS — R3914 Feeling of incomplete bladder emptying: Secondary | ICD-10-CM | POA: Diagnosis not present

## 2022-11-23 ENCOUNTER — Ambulatory Visit (INDEPENDENT_AMBULATORY_CARE_PROVIDER_SITE_OTHER): Payer: Medicare Other | Admitting: Family Medicine

## 2022-11-23 ENCOUNTER — Encounter: Payer: Self-pay | Admitting: Family Medicine

## 2022-11-23 VITALS — BP 128/78 | HR 64 | Temp 97.2°F | Resp 18 | Ht 65.0 in | Wt 207.4 lb

## 2022-11-23 DIAGNOSIS — E039 Hypothyroidism, unspecified: Secondary | ICD-10-CM

## 2022-11-23 DIAGNOSIS — H903 Sensorineural hearing loss, bilateral: Secondary | ICD-10-CM | POA: Insufficient documentation

## 2022-11-23 DIAGNOSIS — I10 Essential (primary) hypertension: Secondary | ICD-10-CM

## 2022-11-23 DIAGNOSIS — E785 Hyperlipidemia, unspecified: Secondary | ICD-10-CM

## 2022-11-23 DIAGNOSIS — N400 Enlarged prostate without lower urinary tract symptoms: Secondary | ICD-10-CM | POA: Diagnosis not present

## 2022-11-23 DIAGNOSIS — E1169 Type 2 diabetes mellitus with other specified complication: Secondary | ICD-10-CM | POA: Diagnosis not present

## 2022-11-23 NOTE — Progress Notes (Signed)
Provider:  [name], MD  Careteam: Patient Care Team: [name], MD as PCP - General (Family Medicine) [name], MD as Consulting Physician (Urology) [name], MD as Consulting Physician (Ophthalmology) [name], MD as Consulting Physician (Ophthalmology)  PLACE OF SERVICE:  [place] Directive information    No Known Allergies  Chief Complaint  Patient presents with   Follow-up    Six Month Follow-up     Quality Metric Gaps    Needs to discuss Shingrix, Diabetic Eye Exam, Kidney Health Evaluation, Covid vaccine and Hemoglobin A1C     HPI: Patient is a 82 y.o. male who is here for routine management of chronic problems including diabetes, hypertension, A-fib,.  He has had some recent issues with enlarged prostate and urinary frequency.  Was checked at [facility] recently and placed on Flomax but has only taken 1 dose of that.  He brings in some blood work from the [place] that he had done recently.  A1c is 6.5 and lipids show a total cholesterol of 136 with an LDL of 59 on rosuvastatin. He denies any blurry vision or numbness or tingling in his feet.  Review of Systems:  Review of Systems  Constitutional: Negative.   Eyes: Negative.   Respiratory: Negative.    Cardiovascular: Negative.   Gastrointestinal: Negative.   Musculoskeletal: Negative.   Neurological: Negative.   Psychiatric/Behavioral: Negative.    All other systems reviewed and are negative.   Past Medical History:  Diagnosis Date   Atrial fibrillation ([place])    Back pain, lumbosacral 09/05/2012   CAD (coronary artery disease), native coronary artery    Cancer of kidney ([place]) 09/05/2010   Cataract 09/06/2011   Diabetes mellitus without complication ([place]) [id number]   Hypertension 09/05/2010   Nephrolithiasis 09/05/2010   Obesity, unspecified    Other and unspecified hyperlipidemia 04/03/2013   Pain, joint, ankle and foot    Unspecified hypothyroidism 09/05/2010    Past Surgical History:  Procedure Laterality Date   ANKLE SURGERY     CATARACT EXTRACTION W/ INTRAOCULAR LENS IMPLANT Bilateral 03/05/2016   Dr. [name]   CORONARY ANGIOPLASTY WITH STENT PLACEMENT     CYST REMOVAL TRUNK  11/03/2017   KIDNEY SURGERY  12/2010   had cancer removed; Dr. [name] CUFF REPAIR     bilateral; Dr. [name]   Social History:   reports that he has never smoked. He has never used smokeless tobacco. He reports that he does not drink alcohol and does not use drugs.  History reviewed. No pertinent family history.  Medications: Patient's Medications  New Prescriptions   No medications on file  Previous Medications   APIXABAN (ELIQUIS) 5 MG TABS TABLET    Take 5 mg by mouth 2 (two) times daily.   ASPIRIN 81 PO    Take by mouth.   BLOOD GLUCOSE MONITORING SUPPL (ONE TOUCH ULTRA 2) W/DEVICE KIT    Use to test blood sugar daily. Dx: E11.69   DILTIAZEM (DILACOR XR) 240 MG 24 HR CAPSULE    Take 240 mg by mouth daily.   FINASTERIDE (PROSCAR) 5 MG TABLET    Take 5 mg by mouth daily.   GLUCOSE BLOOD (ONETOUCH ULTRA) TEST STRIP    TEST BLOOD SUGAR ONCE DAILY   LEVOTHYROXINE (SYNTHROID) 50 MCG TABLET    TAKE 1 TABLET BY MOUTH DAILY  BEFORE BREAKFAST   LOSARTAN (COZAAR) 25 MG TABLET    Take 12.5 mg by mouth daily.  METFORMIN (GLUCOPHAGE) 500 MG TABLET    TAKE 1 TABLET BY MOUTH  TWICE DAILY TO CONTROL  DIABETES   OMEPRAZOLE (PRILOSEC) 20 MG CAPSULE    TAKE 1 CAPSULE BY MOUTH DAILY   ONETOUCH DELICA LANCETS FINE MISC    USE TO CHECK BLOOD SUGAR ONCE DAILY   ROSUVASTATIN (CRESTOR) 10 MG TABLET    Take 10 mg by mouth daily.   SOTALOL (BETAPACE) 80 MG TABLET    Take 80 mg by mouth 2 (two) times daily.   TAMSULOSIN (FLOMAX) 0.4 MG CAPS CAPSULE    Take 0.4 mg by mouth daily.  Modified Medications   No medications on file  Discontinued Medications   No medications on file    Physical Exam:  Vitals:   11/23/22 1110  BP: 128/78  Pulse: 64  Resp: 18  Temp: (!)  97.2 F (36.2 C)  SpO2: 94%  Weight: 207 lb 6 oz (94.1 kg)  Height: 5\' 5"  (1.651 m)   Body mass index is 34.51 kg/m. Wt Readings from Last 3 Encounters:  11/23/22 207 lb 6 oz (94.1 kg)  11/14/22 211 lb (95.7 kg)  08/10/22 213 lb (96.6 kg)    Physical Exam Vitals and nursing note reviewed.  Constitutional:      Appearance: He is obese.  Cardiovascular:     Rate and Rhythm: Normal rate and regular rhythm.  Pulmonary:     Effort: Pulmonary effort is normal.     Breath sounds: Normal breath sounds.  Abdominal:     General: Bowel sounds are normal.     Palpations: Abdomen is soft.  Musculoskeletal:        General: Normal range of motion.  Skin:    General: Skin is warm and dry.  Neurological:     General: No focal deficit present.     Mental Status: He is alert and oriented to person, place, and time.     Labs reviewed: Basic Metabolic Panel: Recent Labs    02/23/22 0930  NA 136  K 4.6  CL 101  CO2 24  GLUCOSE 108*  BUN 16  CREATININE 1.16  CALCIUM 9.9  TSH 2.47   Liver Function Tests: No results for input(s): "AST", "ALT", "ALKPHOS", "BILITOT", "PROT", "ALBUMIN" in the last 8760 hours. No results for input(s): "LIPASE", "AMYLASE" in the last 8760 hours. No results for input(s): "AMMONIA" in the last 8760 hours. CBC: No results for input(s): "WBC", "NEUTROABS", "HGB", "HCT", "MCV", "PLT" in the last 8760 hours. Lipid Panel: No results for input(s): "CHOL", "HDL", "LDLCALC", "TRIG", "CHOLHDL", "LDLDIRECT" in the last 8760 hours. TSH: Recent Labs    02/23/22 0930  TSH 2.47   A1C: Lab Results  Component Value Date   HGBA1C 5.9 (H) 02/23/2022     Assessment/Plan  1. Primary hypertension Blood pressure 128/78.  He is on losartan diltiazem as well as sotalol.  2. Type 2 diabetes mellitus with other specified complication, without long-term current use of insulin ([name]) Continues with metformin 500 mg twice daily.  Most recent A1c was at target but  will check today  3. Benign prostatic hyperplasia, unspecified whether lower urinary tract symptoms present Continues with finasteride at times also suggest added.  Have given him permission to increase Flomax 2.8 if not seeing desired effect within 1 week  4. Hyperlipidemia associated with type 2 diabetes mellitus ([place]) Lipids are at goal on rosuvastatin.  Continue same  5. Acquired hypothyroidism TSH in acceptable range.  Need to check it next visit  [name], MD [facility] [phone]

## 2022-11-24 LAB — HEMOGLOBIN A1C
Hgb A1c MFr Bld: 6.4 % of total Hgb — ABNORMAL HIGH (ref <5.7)
Mean Plasma Glucose: 137 mg/dL
eAG (mmol/L): 7.6 mmol/L

## 2022-11-24 LAB — BASIC METABOLIC PANEL WITH GFR
BUN: 15 mg/dL (ref 7–25)
CO2: 24 mmol/L (ref 20–32)
Calcium: 9.4 mg/dL (ref 8.6–10.3)
Chloride: 102 mmol/L (ref 98–110)
Creat: 1.1 mg/dL (ref 0.70–1.28)
Glucose, Bld: 118 mg/dL — ABNORMAL HIGH (ref 65–99)
Potassium: 4.5 mmol/L (ref 3.5–5.3)
Sodium: 139 mmol/L (ref 135–146)
eGFR: 68 mL/min/{1.73_m2} (ref >=60)

## 2022-11-25 ENCOUNTER — Telehealth: Payer: Self-pay

## 2022-11-25 NOTE — Telephone Encounter (Signed)
Patient would like lab results. Dr. Sabra Heck is out of office so message sent to Sherrie Mustache, NP. Could you please review labs and send response to [facility] clinical pool

## 2022-12-14 DIAGNOSIS — R3914 Feeling of incomplete bladder emptying: Secondary | ICD-10-CM | POA: Diagnosis not present

## 2022-12-14 NOTE — Telephone Encounter (Signed)
This encounter was created in error - please disregard.

## 2022-12-15 DIAGNOSIS — E785 Hyperlipidemia, unspecified: Secondary | ICD-10-CM | POA: Diagnosis not present

## 2022-12-15 DIAGNOSIS — E119 Type 2 diabetes mellitus without complications: Secondary | ICD-10-CM | POA: Diagnosis not present

## 2022-12-15 DIAGNOSIS — I10 Essential (primary) hypertension: Secondary | ICD-10-CM | POA: Diagnosis not present

## 2022-12-15 DIAGNOSIS — I2510 Atherosclerotic heart disease of native coronary artery without angina pectoris: Secondary | ICD-10-CM | POA: Diagnosis not present

## 2022-12-15 DIAGNOSIS — I4891 Unspecified atrial fibrillation: Secondary | ICD-10-CM | POA: Diagnosis not present

## 2023-01-13 ENCOUNTER — Other Ambulatory Visit: Payer: Self-pay | Admitting: Family Medicine

## 2023-01-13 DIAGNOSIS — E119 Type 2 diabetes mellitus without complications: Secondary | ICD-10-CM

## 2023-01-23 ENCOUNTER — Other Ambulatory Visit: Payer: Self-pay | Admitting: Family Medicine

## 2023-03-07 ENCOUNTER — Telehealth: Payer: Self-pay | Admitting: *Deleted

## 2023-03-07 ENCOUNTER — Encounter: Payer: Self-pay | Admitting: Nurse Practitioner

## 2023-03-07 ENCOUNTER — Encounter: Payer: Medicare Other | Admitting: Nurse Practitioner

## 2023-03-07 NOTE — Telephone Encounter (Signed)
[name], Nicholas Rivera are scheduled for a virtual visit with your provider today.    Just as we do with appointments in the office, we must obtain your consent to participate.  Your consent will be active for this visit and any virtual visit you Nicholas Rivera have with one of our providers in the next 365 days.    If you have a MyChart account, I can also send a copy of this consent to you electronically.  All virtual visits are billed to your insurance company just like a traditional visit in the office.  As this is a virtual visit, video technology does not allow for your provider to perform a traditional examination.  This Nicholas Rivera limit your provider's ability to fully assess your condition.  If your provider identifies any concerns that need to be evaluated in person or the need to arrange testing such as labs, EKG, etc, we will make arrangements to do so.    Although advances in technology are sophisticated, we cannot ensure that it will always work on either your end or our end.  If the connection with a video visit is poor, we Nicholas Rivera have to switch to a telephone visit.  With either a video or telephone visit, we are not always able to ensure that we have a secure connection.   I need to obtain your verbal consent now.   Are you willing to proceed with your visit today?   Nicholas Rivera has provided verbal consent on 03/07/2023 for a virtual visit (video or telephone).   Nicholas Rivera, [place] 03/07/2023  9:52 AM

## 2023-03-07 NOTE — Telephone Encounter (Signed)
Patient unable to connect with Shanda Bumps at the Visit.  Sound was not working.   Called patient and patient has an upcoming appointment with Dr. Hyacinth Meeker and did not want to schedule an appointment for a AWV in person due to a 55 mile drive to the office.   Patient stated that he will discuss a AWV at his appointment with Dr. Hyacinth Meeker.

## 2023-03-07 NOTE — Progress Notes (Signed)
Erroneous Encounter  This encounter was created in error - please disregard.

## 2023-03-07 NOTE — Progress Notes (Deleted)
  This service is provided via telemedicine  No vital signs collected/recorded due to the encounter was a telemedicine visit.   Location of patient (ex: home, work):  Home  Patient consents to a telephone visit:  Yes  Location of the provider (ex: office, home):  Office [place].   Name of any referring provider:  na  Names of all persons participating in the telemedicine service and their role in the encounter:  Jennye Moccasin, Patient, Nelda Severe, CMA, Abbey Chatters, NP  Time spent on call:  7:12

## 2023-03-22 ENCOUNTER — Ambulatory Visit (INDEPENDENT_AMBULATORY_CARE_PROVIDER_SITE_OTHER): Payer: Medicare Other | Admitting: Family Medicine

## 2023-03-22 ENCOUNTER — Encounter: Payer: Self-pay | Admitting: Family Medicine

## 2023-03-22 VITALS — BP 138/80 | HR 57 | Temp 97.5°F | Resp 16 | Ht 65.0 in | Wt 213.0 lb

## 2023-03-22 DIAGNOSIS — E1169 Type 2 diabetes mellitus with other specified complication: Secondary | ICD-10-CM

## 2023-03-22 DIAGNOSIS — I4891 Unspecified atrial fibrillation: Secondary | ICD-10-CM | POA: Diagnosis not present

## 2023-03-22 DIAGNOSIS — I25110 Atherosclerotic heart disease of native coronary artery with unstable angina pectoris: Secondary | ICD-10-CM

## 2023-03-22 DIAGNOSIS — E785 Hyperlipidemia, unspecified: Secondary | ICD-10-CM | POA: Diagnosis not present

## 2023-03-22 DIAGNOSIS — I10 Essential (primary) hypertension: Secondary | ICD-10-CM

## 2023-03-22 NOTE — Progress Notes (Signed)
Provider:  Jacalyn Lefevre, MD  Careteam: Patient Care Team: Frederica Kuster, MD as PCP - General (Family Medicine) Heloise Purpura, MD as Consulting Physician (Urology) Stephannie Li, MD as Consulting Physician (Ophthalmology) Nelson Chimes, MD as Consulting Physician (Ophthalmology)  PLACE OF SERVICE:  [facility]  Advanced Directive information Does Patient Have a Medical Advance Directive?: Yes, Type of Advance Directive: Living will, Does patient want to make changes to medical advance directive?: No - Patient declined  No Known Allergies  Chief Complaint  Patient presents with   Medical Management of Chronic Issues    Patient is here for a follow up for DM II, and other chronic conditions. Also here to discuss AWV    Quality Metric Gaps    Patient is due for AWV, diabetic foot and eye exam, discuss need for shingrix.     HPI: Patient is a 82 y.o. male .  Routine visit for medical maintenance of chronic problems including A-fib, coronary artery disease, diabetes mellitus, hypertension.  Recently saw his cardiologist in [place].  At that visit went well no changes were made in medication or otherwise.  He also receives some care from the [place] system.  A1c at that visit was 6.2.  There is been no chest pain or other symptoms of A-fib.   Review of Systems:  Review of Systems  Constitutional: Negative.   Respiratory: Negative.    Cardiovascular:  Positive for leg swelling.  Musculoskeletal: Negative.   Neurological: Negative.   Psychiatric/Behavioral: Negative.    All other systems reviewed and are negative.   Past Medical History:  Diagnosis Date   Atrial fibrillation (HCC)    Back pain, lumbosacral 09/05/2012   CAD (coronary artery disease), native coronary artery    Cancer of kidney (HCC) 09/05/2010   Cataract 09/06/2011   Diabetes mellitus without complication (HCC) 09/05/2010   Hypertension 09/05/2010   Nephrolithiasis 09/05/2010   Obesity, unspecified     Other and unspecified hyperlipidemia 04/03/2013   Pain, joint, ankle and foot    Unspecified hypothyroidism 09/05/2010   Past Surgical History:  Procedure Laterality Date   ANKLE SURGERY     CATARACT EXTRACTION W/ INTRAOCULAR LENS IMPLANT Bilateral 03/05/2016   Dr. Hazle Quant   CORONARY ANGIOPLASTY WITH STENT PLACEMENT     CYST REMOVAL TRUNK  11/03/2017   KIDNEY SURGERY  12/2010   had cancer removed; Dr. Jerl Mina CUFF REPAIR     bilateral; Dr. Ranell Patrick   Social History:   reports that he has never smoked. He has never used smokeless tobacco. He reports that he does not drink alcohol and does not use drugs.  History reviewed. No pertinent family history.  Medications: Patient's Medications  New Prescriptions   No medications on file  Previous Medications   APIXABAN (ELIQUIS) 5 MG TABS TABLET    Take 5 mg by mouth 2 (two) times daily.   ASPIRIN 81 PO    Take by mouth.   BLOOD GLUCOSE MONITORING SUPPL (ONE TOUCH ULTRA 2) W/DEVICE KIT    Use to test blood sugar daily. Dx: E11.69   DILTIAZEM (DILACOR XR) 240 MG 24 HR CAPSULE    Take 240 mg by mouth daily.   FINASTERIDE (PROSCAR) 5 MG TABLET    Take 5 mg by mouth daily.   GLUCOSE BLOOD (ONETOUCH ULTRA) TEST STRIP    CHECK BLOOD SUGAR ONCE DAILY   LEVOTHYROXINE (SYNTHROID) 50 MCG TABLET    TAKE 1 TABLET BY MOUTH DAILY  BEFORE  BREAKFAST   LOSARTAN (COZAAR) 25 MG TABLET    Take 25 mg by mouth daily.   METFORMIN (GLUCOPHAGE) 500 MG TABLET    TAKE 1 TABLET BY MOUTH TWICE  DAILY TO CONTROL DIABETES   OMEPRAZOLE (PRILOSEC) 20 MG CAPSULE    TAKE 1 CAPSULE BY MOUTH DAILY   ONETOUCH DELICA LANCETS FINE MISC    USE TO CHECK BLOOD SUGAR ONCE DAILY   ROSUVASTATIN (CRESTOR) 10 MG TABLET    Take 10 mg by mouth daily.   SOTALOL (BETAPACE) 80 MG TABLET    Take 80 mg by mouth 2 (two) times daily.   TAMSULOSIN (FLOMAX) 0.4 MG CAPS CAPSULE    Take 0.4 mg by mouth daily.  Modified Medications   No medications on file  Discontinued Medications    No medications on file    Physical Exam:  Vitals:   03/22/23 0858 03/22/23 0922  BP: 138/70 138/80  Pulse: (!) 57 (!) 57  Resp:  16  Temp:  (!) 97.5 F (36.4 C)  SpO2:  95%  Weight:  213 lb (96.6 kg)  Height: 5\' 5"  (1.651 m) 5\' 5"  (1.651 m)   Body mass index is 35.45 kg/m. Wt Readings from Last 3 Encounters:  03/22/23 213 lb (96.6 kg)  11/23/22 207 lb 6 oz (94.1 kg)  11/14/22 211 lb (95.7 kg)    Physical Exam Vitals and nursing note reviewed.  Constitutional:      Appearance: Normal appearance.  HENT:     Mouth/Throat:     Mouth: Mucous membranes are moist.     Pharynx: Oropharynx is clear.  Cardiovascular:     Rate and Rhythm: Normal rate and regular rhythm.     Pulses: Normal pulses.  Pulmonary:     Effort: Pulmonary effort is normal.     Breath sounds: Normal breath sounds.  Abdominal:     General: Bowel sounds are normal.     Palpations: Abdomen is soft.  Neurological:     General: No focal deficit present.     Mental Status: He is alert and oriented to person, place, and time.     Labs reviewed: Basic Metabolic Panel: Recent Labs    11/23/22 1153  NA 139  K 4.5  CL 102  CO2 24  GLUCOSE 118*  BUN 15  CREATININE 1.10  CALCIUM 9.4   Liver Function Tests: No results for input(s): "AST", "ALT", "ALKPHOS", "BILITOT", "PROT", "ALBUMIN" in the last 8760 hours. No results for input(s): "LIPASE", "AMYLASE" in the last 8760 hours. No results for input(s): "AMMONIA" in the last 8760 hours. CBC: No results for input(s): "WBC", "NEUTROABS", "HGB", "HCT", "MCV", "PLT" in the last 8760 hours. Lipid Panel: No results for input(s): "CHOL", "HDL", "LDLCALC", "TRIG", "CHOLHDL", "LDLDIRECT" in the last 8760 hours. TSH: No results for input(s): "TSH" in the last 8760 hours. A1C: Lab Results  Component Value Date   HGBA1C 6.4 (H) 11/23/2022     Assessment/Plan  1. Type 2 diabetes mellitus with other specified complication, without long-term current use of  insulin (HCC) Diabetes management has been at goal currently takes metformin 500 mg twice daily  2. Hyperlipidemia associated with type 2 diabetes mellitus (HCC) Lipids last assessed in March 2022 although patient thinks it may have been done at the [place] visit.  There are no results from the  3. Atrial fibrillation, unspecified type (HCC) Continues in sinus rhythm on combination sotalol diltiazem as well as Eliquis for anticoagulation  4. Coronary artery disease involving native  coronary artery of native heart with unstable angina pectoris [facility]) Patient is status post stenting in August 2022.  Now asymptomatic.  Continues with ASA  5. Primary hypertension Patient brings a record of blood pressures almost always checked in the morning and numbers are well within goal.  Blood pressure controlled with combination losartan diltiazem and sotalol   Jacalyn Lefevre, MD [facility] [phone]

## 2023-03-23 LAB — LIPID PANEL
Cholesterol: 144 mg/dL (ref <200)
HDL: 53 mg/dL (ref >=40)
LDL Cholesterol (Calc): 66 mg/dL (calc)
Non-HDL Cholesterol (Calc): 91 mg/dL (calc) (ref <130)
Total CHOL/HDL Ratio: 2.7 (calc) (ref <5.0)
Triglycerides: 175 mg/dL — ABNORMAL HIGH (ref <150)

## 2023-03-23 LAB — HEMOGLOBIN A1C
Hgb A1c MFr Bld: 6.3 % of total Hgb — ABNORMAL HIGH (ref <5.7)
Mean Plasma Glucose: 134 mg/dL
eAG (mmol/L): 7.4 mmol/L

## 2023-03-23 LAB — COMPREHENSIVE METABOLIC PANEL
AG Ratio: 1.8 (calc) (ref 1.0–2.5)
ALT: 15 U/L (ref 9–46)
AST: 15 U/L (ref 10–35)
Albumin: 4.2 g/dL (ref 3.6–5.1)
Alkaline phosphatase (APISO): 92 U/L (ref 35–144)
BUN: 16 mg/dL (ref 7–25)
CO2: 27 mmol/L (ref 20–32)
Calcium: 9.6 mg/dL (ref 8.6–10.3)
Chloride: 105 mmol/L (ref 98–110)
Creat: 1.03 mg/dL (ref 0.70–1.22)
Globulin: 2.3 g/dL (calc) (ref 1.9–3.7)
Glucose, Bld: 105 mg/dL — ABNORMAL HIGH (ref 65–99)
Potassium: 4.7 mmol/L (ref 3.5–5.3)
Sodium: 139 mmol/L (ref 135–146)
Total Bilirubin: 0.5 mg/dL (ref 0.2–1.2)
Total Protein: 6.5 g/dL (ref 6.1–8.1)

## 2023-04-17 ENCOUNTER — Encounter: Payer: Self-pay | Admitting: Family

## 2023-04-17 ENCOUNTER — Ambulatory Visit (INDEPENDENT_AMBULATORY_CARE_PROVIDER_SITE_OTHER): Payer: Medicare Other | Admitting: Family

## 2023-04-17 VITALS — BP 133/78 | HR 53 | Temp 97.3°F | Resp 20 | Ht 65.0 in | Wt 206.8 lb

## 2023-04-17 DIAGNOSIS — Z0000 Encounter for general adult medical examination without abnormal findings: Secondary | ICD-10-CM

## 2023-04-17 NOTE — Progress Notes (Signed)
Subjective:   Nicholas Rivera is a 82 y.o. male who presents for Medicare Annual/Subsequent preventive examination.  Visit Complete: In person  Patient Medicare AWV questionnaire was completed by the patient on 04/17/2023 ; I have confirmed that all information answered by patient is correct and no changes since this date.  Review of Systems     Cardiac Risk Factors include: advanced age (>46men, >66 women);male gender;dyslipidemia;diabetes mellitus;hypertension;obesity (BMI >30kg/m2);smoking/ tobacco exposure     Objective:    Today's Vitals   04/17/23 1308 04/17/23 1440  BP: 133/78   Pulse: (!) 53   Resp: 20   Temp: (!) 97.3 F (36.3 C)   SpO2: 96%   Weight: 206 lb 12.8 oz (93.8 kg)   Height: 5\' 5"  (1.651 m)   PainSc: 0-No pain 3    Body mass index is 34.41 kg/m.     04/17/2023   10:42 AM 03/22/2023    8:59 AM 03/07/2023    9:47 AM 11/14/2022    2:41 PM 03/03/2022    9:56 AM 08/18/2021    8:37 AM 04/20/2021    4:37 PM  Advanced Directives  Does Patient Have a Medical Advance Directive? Yes Yes Yes Yes Yes Yes Yes  Type of Advance Directive Living will;Healthcare Power of Attorney Living will Living will Healthcare Power of [place];Living will Healthcare Power of [place];Living will Healthcare Power of [employer] of [place];Living will  Does patient want to make changes to medical advance directive? No - Patient declined No - Patient declined No - Patient declined No - Patient declined No - Patient declined No - Patient declined No - Patient declined  Copy of Healthcare Power of Attorney in Chart? Yes - validated most recent copy scanned in chart (See row information)   Yes - validated most recent copy scanned in chart (See row information) Yes - validated most recent copy scanned in chart (See row information) Yes - validated most recent copy scanned in chart (See row information) Yes - validated most recent copy scanned in chart (See row information)     Current Medications (verified) Outpatient Encounter Medications as of 04/17/2023  Medication Sig   apixaban (ELIQUIS) 5 MG TABS tablet Take 5 mg by mouth 2 (two) times daily.   ASPIRIN 81 PO Take by mouth.   Blood Glucose Monitoring Suppl (ONE TOUCH ULTRA 2) w/Device KIT Use to test blood sugar daily. Dx: E11.69   diltiazem (DILACOR XR) 240 MG 24 hr capsule Take 240 mg by mouth daily.   finasteride (PROSCAR) 5 MG tablet Take 5 mg by mouth daily.   glucose blood (ONETOUCH ULTRA) test strip CHECK BLOOD SUGAR ONCE DAILY   levothyroxine (SYNTHROID) 50 MCG tablet TAKE 1 TABLET BY MOUTH DAILY  BEFORE BREAKFAST   losartan (COZAAR) 25 MG tablet Take 25 mg by mouth daily.   metFORMIN (GLUCOPHAGE) 500 MG tablet TAKE 1 TABLET BY MOUTH TWICE  DAILY TO CONTROL DIABETES   omeprazole (PRILOSEC) 20 MG capsule TAKE 1 CAPSULE BY MOUTH DAILY   ONETOUCH DELICA LANCETS FINE MISC USE TO CHECK BLOOD SUGAR ONCE DAILY   rosuvastatin (CRESTOR) 10 MG tablet Take 10 mg by mouth daily.   sotalol (BETAPACE) 80 MG tablet Take 80 mg by mouth 2 (two) times daily.   tamsulosin (FLOMAX) 0.4 MG CAPS capsule Take 0.4 mg by mouth daily.   No facility-administered encounter medications on file as of 04/17/2023.    Allergies (verified) Patient has no known allergies.   History: Past Medical  History:  Diagnosis Date   Atrial fibrillation (HCC)    Back pain, lumbosacral 09/05/2012   CAD (coronary artery disease), native coronary artery    Cancer of kidney (HCC) 09/05/2010   Cataract 09/06/2011   Diabetes mellitus without complication (HCC) 09/05/2010   Hypertension 09/05/2010   Nephrolithiasis 09/05/2010   Obesity, unspecified    Other and unspecified hyperlipidemia 04/03/2013   Pain, joint, ankle and foot    Unspecified hypothyroidism 09/05/2010   Past Surgical History:  Procedure Laterality Date   ANKLE SURGERY     CATARACT EXTRACTION W/ INTRAOCULAR LENS IMPLANT Bilateral 03/05/2016   Dr. Hazle Quant   CORONARY  ANGIOPLASTY WITH STENT PLACEMENT     CYST REMOVAL TRUNK  11/03/2017   KIDNEY SURGERY  12/2010   had cancer removed; Dr. Jerl Mina CUFF REPAIR     bilateral; Dr. Ranell Patrick   History reviewed. No pertinent family history. Social History   Socioeconomic History   Marital status: Married    Spouse name: Not on file   Number of children: Not on file   Years of education: Not on file   Highest education level: Not on file  Occupational History   Occupation: Retired  Tobacco Use   Smoking status: Never   Smokeless tobacco: Never  Vaping Use   Vaping status: Never Used  Substance and Sexual Activity   Alcohol use: No   Drug use: No   Sexual activity: Yes  Other Topics Concern   Not on file  Social History Narrative   Patient was previously married. His wife has died from cancer.   Remarried to Satira Sark   Never smoked   Alcohol none   Exercise 30 minutes daily   POA   Social Determinants of Health   Financial Resource Strain: Low Risk  (02/12/2018)   Overall Financial Resource Strain (CARDIA)    Difficulty of Paying Living Expenses: Not hard at all  Food Insecurity: No Food Insecurity (02/12/2018)   Hunger Vital Sign    Worried About Running Out of Food in the Last Year: Never true    Ran Out of Food in the Last Year: Never true  Transportation Needs: No Transportation Needs (02/12/2018)   PRAPARE - Administrator, Civil Service (Medical): No    Lack of Transportation (Non-Medical): No  Physical Activity: Sufficiently Active (02/12/2018)   Exercise Vital Sign    Days of Exercise per Week: 7 days    Minutes of Exercise per Session: 30 min  Stress: No Stress Concern Present (02/12/2018)   [employer] of Occupational Health - Occupational Stress Questionnaire    Feeling of Stress : Only a little  Social Connections: Moderately Integrated (02/12/2018)   Social Connection and Isolation Panel [NHANES]    Frequency of Communication with Friends and Family:  More than three times a week    Frequency of Social Gatherings with Friends and Family: More than three times a week    Attends Religious Services: More than 4 times per year    Active Member of [employer] or Organizations: No    Attends Engineer, structural: Never    Marital Status: Married    Tobacco Counseling Counseling given: Not Answered   Clinical Intake:     Pain : 0-10 Pain Score: 3  Pain Type: Chronic pain Pain Location: Ankle Pain Orientation: Left Pain Descriptors / Indicators: Aching Pain Onset: More than a month ago Pain Frequency: Intermittent Pain Relieving Factors: tylenol Effect of Pain on  Daily Activities: stiffeness with driving  Pain Relieving Factors: tylenol  BMI - recorded: 34.41 Nutritional Status: BMI > 30  Obese Nutritional Risks: None Diabetes: No  How often do you need to have someone help you when you read instructions, pamphlets, or other written materials from your doctor or pharmacy?: 1 - Never (uses readers when it's fine print) What is the last grade level you completed in school?: 12 grade  Interpreter Needed?: No      Activities of Daily Living    04/17/2023    2:48 PM 04/13/2023   10:56 AM  In your present state of health, do you have any difficulty performing the following activities:  Hearing? 1 1  Comment wears hearing aids   Vision? 0 0  Difficulty concentrating or making decisions? 0 0  Walking or climbing stairs? 0 0  Dressing or bathing? 0 0  Doing errands, shopping? 0 0  Preparing Food and eating ? N N  Using the Toilet? N N  In the past six months, have you accidently leaked urine? N N  Do you have problems with loss of bowel control? N N  Managing your Medications? N N  Managing your Finances? N N  Housekeeping or managing your Housekeeping? N N    Patient Care Team: Frederica Kuster, MD as PCP - General (Family Medicine) Heloise Purpura, MD as Consulting Physician (Urology) Stephannie Li, MD as  Consulting Physician (Ophthalmology) Nelson Chimes, MD as Consulting Physician (Ophthalmology)  Indicate any recent Medical Services you may have received from other than [facility] providers in the past year (date may be approximate).     Assessment:   This is a routine wellness examination for Karina.  Hearing/Vision screen No results found.  Dietary issues and exercise activities discussed:     Goals Addressed             This Visit's Progress    Weight (lb) < 200 lb (90.7 kg)   206 lb 12.8 oz (93.8 kg)    Would like to loss weight        Depression Screen    04/17/2023    2:12 PM 03/07/2023    9:47 AM 03/03/2022    9:57 AM 02/25/2021    8:35 AM 11/26/2020    8:29 AM 02/25/2020    8:43 AM 08/23/2019    1:22 PM  PHQ 2/9 Scores  PHQ - 2 Score 0 0 0 0 0 0 0  PHQ- 9 Score 0          Fall Risk    04/17/2023    2:13 PM 04/13/2023   10:56 AM 03/22/2023    8:59 AM 03/07/2023    9:47 AM 08/10/2022   10:49 AM  Fall Risk   Falls in the past year? 0 0 0 0 1  Number falls in past yr: 0  0 0 0  Injury with Fall? 0  0 0 1  Risk for fall due to : No Fall Risks  No Fall Risks  History of fall(s)  Follow up Falls evaluation completed  Falls evaluation completed  Falls evaluation completed    MEDICARE RISK AT HOME:  Medicare Risk at Home - 04/17/23 1450     Any stairs in or around the home? No    If so, are there any without handrails? No    Home free of loose throw rugs in walkways, pet beds, electrical cords, etc? No    Adequate lighting in your home to  reduce risk of falls? Yes    Life alert? No    Use of a cane, walker or w/c? No    Grab bars in the bathroom? Yes    Shower chair or bench in shower? No    Elevated toilet seat or a handicapped toilet? Yes             TIMED UP AND GO:  Was the test performed?  Yes  Length of time to ambulate 10 feet: 8 sec Gait steady and fast without use of assistive device    Cognitive Function:    04/17/2023    2:13 PM 02/12/2018    10:23 AM 01/06/2017   10:54 AM 04/05/2016    3:00 PM 06/04/2014    1:41 PM  MMSE - Mini Mental State Exam  Orientation to time 5 5 5 5 5   Orientation to Place 5 5 5 5 5   Registration 3 3 3 3 3   Attention/ Calculation 5 4 5 5 5   Recall 3 2 3 2 3   Language- name 2 objects 2 2 2 2 2   Language- repeat 1 1 1 1 1   Language- follow 3 step command 3 3 3 3 3   Language- read & follow direction 1 1 1 1 1   Write a sentence 1 1 1 1 1   Copy design 0 1 1 1 1   Total score 29 28 30 29 30         03/07/2023    9:47 AM 03/03/2022   10:11 AM 02/25/2021    8:36 AM 02/25/2020    8:25 AM 02/15/2019    9:07 AM  6CIT Screen  What Year? 0 points 0 points 0 points 0 points 0 points  What month? 0 points 0 points 0 points 0 points 0 points  What time? 0 points 0 points 0 points 0 points 0 points  Count back from 20 0 points 2 points 0 points 0 points 0 points  Months in reverse 0 points 0 points 0 points 0 points 0 points  Repeat phrase 0 points 2 points 0 points 0 points 0 points  Total Score 0 points 4 points 0 points 0 points 0 points    Immunizations Immunization History  Administered Date(s) Administered   Fluad Quad(high Dose 65+) 06/30/2021   Influenza, High Dose Seasonal PF 06/20/2017, 06/02/2018, 04/30/2019, 06/28/2020, 06/15/2022   Influenza,inj,Quad PF,6+ Mos 05/13/2015   Influenza-Unspecified 06/05/2013, 06/20/2014, 05/30/2016, 06/30/2021, 06/15/2022   PFIZER(Purple Top)SARS-COV-2 Vaccination 09/22/2019, 10/13/2019, 06/14/2020, 12/28/2020, 07/06/2021   Pfizer Covid-19 Vaccine Bivalent Booster 30yrs & up 07/06/2021, 02/16/2022, 06/15/2022   Pneumococcal Conjugate-13 06/04/2014   Pneumococcal Polysaccharide-23 09/05/1998, 08/03/2020   Td 09/06/2003, 10/16/2017    TDAP status: Up to date  Flu Vaccine status: Due, Education has been provided regarding the importance of this vaccine. Advised may receive this vaccine at local pharmacy or Health Dept. Aware to provide a copy of the vaccination  record if obtained from local pharmacy or Health Dept. Verbalized acceptance and understanding.  Pneumococcal vaccine status: Up to date  Covid-19 vaccine status: Information provided on how to obtain vaccines.   Qualifies for Shingles Vaccine? Yes   Zostavax completed No   Shingrix Completed?: No.    Education has been provided regarding the importance of this vaccine. Patient has been advised to call insurance company to determine out of pocket expense if they have not yet received this vaccine. Advised may also receive vaccine at local pharmacy or Health Dept. Verbalized acceptance and understanding.  Screening Tests Health Maintenance  Topic Date Due   Zoster Vaccines- Shingrix (1 of 2) Never done   OPHTHALMOLOGY EXAM  01/23/2019   Diabetic kidney evaluation - Urine ACR  07/03/2019   COVID-19 Vaccine (8 - 2023-24 season) 08/10/2022   INFLUENZA VACCINE  04/06/2023   HEMOGLOBIN A1C  09/22/2023   Diabetic kidney evaluation - eGFR measurement  03/21/2024   FOOT EXAM  03/21/2024   Medicare Annual Wellness (AWV)  04/16/2024   DTaP/Tdap/Td (3 - Tdap) 10/17/2027   Pneumonia Vaccine 6+ Years old  Completed   HPV VACCINES  Aged Out   Hepatitis C Screening  Discontinued    Health Maintenance  Health Maintenance Due  Topic Date Due   Zoster Vaccines- Shingrix (1 of 2) Never done   OPHTHALMOLOGY EXAM  01/23/2019   Diabetic kidney evaluation - Urine ACR  07/03/2019   COVID-19 Vaccine (8 - 2023-24 season) 08/10/2022   INFLUENZA VACCINE  04/06/2023    Colorectal cancer screening: No longer required.   Lung Cancer Screening: (Low Dose CT Chest recommended if Age 26-80 years, 20 pack-year currently smoking OR have quit w/in 15years.) does not qualify.   Lung Cancer Screening Referral: No   Additional Screening:  Hepatitis C Screening: does not qualify; Completed N/A   Vision Screening: Recommended annual ophthalmology exams for early detection of glaucoma and other disorders of  the eye. Is the patient up to date with their annual eye exam?  Yes  Who is the provider or what is the name of the office in which the patient attends annual eye exams? Dr. Sherrine Maples If pt is not established with a provider, would they like to be referred to a provider to establish care? No .   Dental Screening: Recommended annual dental exams for proper oral hygiene  Diabetic Foot Exam: Diabetic Foot Exam: Completed 03/22/2023   Community Resource Referral / Chronic Care Management: CRR required this visit?  No   CCM required this visit?  No     Plan:     I have personally reviewed and noted the following in the patient's chart:   Medical and social history Use of alcohol, tobacco or illicit drugs  Current medications and supplements including opioid prescriptions. Patient is not currently taking opioid prescriptions. Functional ability and status Nutritional status Physical activity Advanced directives List of other physicians Hospitalizations, surgeries, and ER visits in previous 12 months Vitals Screenings to include cognitive, depression, and falls Referrals and appointments  In addition, I have reviewed and discussed with patient certain preventive protocols, quality metrics, and best practice recommendations. A written personalized care plan for preventive services as well as general preventive health recommendations were provided to patient.    Caesar Bookman, NP   04/17/2023   After Visit Summary: (Pick Up) Due to this being a telephonic visit, with patients personalized plan was offered to patient and patient has requested to Pick up at office.In Person   Nurse Notes: will get COVID-19 vaccine, shingles vaccine at the [facility] or Pharmacy

## 2023-04-17 NOTE — Patient Instructions (Addendum)
[name] , Thank you for taking time to come for your Medicare Wellness Visit. I appreciate your ongoing commitment to your health goals. Please review the following plan we discussed and let me know if I can assist you in the future.   Screening recommendations/referrals: Colonoscopy : N/A  Recommended yearly ophthalmology/optometry visit for glaucoma screening and checkup Recommended yearly dental visit for hygiene and checkup  Vaccinations: Influenza vaccine due annually in September/October Pneumococcal vaccine : Up to date  Tdap vaccine  : Up to date  Shingles vaccine Due     Advanced directives: Yes   Conditions/risks identified:  advanced age (>54men, >42 women);male gender;dyslipidemia;diabetes mellitus;hypertension;obesity (BMI >30kg/m2);smoking/ tobacco exposure  Next appointment: 1 year   Preventive Care 12 Years and Older, Male Preventive care refers to lifestyle choices and visits with your health care provider that can promote health and wellness. What does preventive care include? A yearly physical exam. This is also called an annual well check. Dental exams once or twice a year. Routine eye exams. Ask your health care provider how often you should have your eyes checked. Personal lifestyle choices, including: Daily care of your teeth and gums. Regular physical activity. Eating a healthy diet. Avoiding tobacco and drug use. Limiting alcohol use. Practicing safe sex. Taking low doses of aspirin every day. Taking vitamin and mineral supplements as recommended by your health care provider. What happens during an annual well check? The services and screenings done by your health care provider during your annual well check will depend on your age, overall health, lifestyle risk factors, and family history of disease. Counseling  Your health care provider may ask you questions about your: Alcohol use. Tobacco use. Drug use. Emotional well-being. Home and  relationship well-being. Sexual activity. Eating habits. History of falls. Memory and ability to understand (cognition). Work and work Astronomer. Screening  You may have the following tests or measurements: Height, weight, and BMI. Blood pressure. Lipid and cholesterol levels. These may be checked every 5 years, or more frequently if you are over 51 years old. Skin check. Lung cancer screening. You may have this screening every year starting at age 49 if you have a 30-pack-year history of smoking and currently smoke or have quit within the past 15 years. Fecal occult blood test (FOBT) of the stool. You may have this test every year starting at age 23. Flexible sigmoidoscopy or colonoscopy. You may have a sigmoidoscopy every 5 years or a colonoscopy every 10 years starting at age 24. Prostate cancer screening. Recommendations will vary depending on your family history and other risks. Hepatitis C blood test. Hepatitis B blood test. Sexually transmitted disease (STD) testing. Diabetes screening. This is done by checking your blood sugar (glucose) after you have not eaten for a while (fasting). You may have this done every 1-3 years. Abdominal aortic aneurysm (AAA) screening. You may need this if you are a current or former smoker. Osteoporosis. You may be screened starting at age 20 if you are at high risk. Talk with your health care provider about your test results, treatment options, and if necessary, the need for more tests. Vaccines  Your health care provider may recommend certain vaccines, such as: Influenza vaccine. This is recommended every year. Tetanus, diphtheria, and acellular pertussis (Tdap, Td) vaccine. You may need a Td booster every 10 years. Zoster vaccine. You may need this after age 68. Pneumococcal 13-valent conjugate (PCV13) vaccine. One dose is recommended after age 76. Pneumococcal polysaccharide (PPSV23) vaccine. One dose  is recommended after age 59. Talk to your  health care provider about which screenings and vaccines you need and how often you need them. This information is not intended to replace advice given to you by your health care provider. Make sure you discuss any questions you have with your health care provider. Document Released: 09/18/2015 Document Revised: 05/11/2016 Document Reviewed: 06/23/2015 Elsevier Interactive Patient Education  2017 [employer].  Fall Prevention in the Home Falls can cause injuries. They can happen to people of all ages. There are many things you can do to make your home safe and to help prevent falls. What can I do on the outside of my home? Regularly fix the edges of walkways and driveways and fix any cracks. Remove anything that might make you trip as you walk through a door, such as a raised step or threshold. Trim any bushes or trees on the path to your home. Use bright outdoor lighting. Clear any walking paths of anything that might make someone trip, such as rocks or tools. Regularly check to see if handrails are loose or broken. Make sure that both sides of any steps have handrails. Any raised decks and porches should have guardrails on the edges. Have any leaves, snow, or ice cleared regularly. Use sand or salt on walking paths during winter. Clean up any spills in your garage right away. This includes oil or grease spills. What can I do in the bathroom? Use night lights. Install grab bars by the toilet and in the tub and shower. Do not use towel bars as grab bars. Use non-skid mats or decals in the tub or shower. If you need to sit down in the shower, use a plastic, non-slip stool. Keep the floor dry. Clean up any water that spills on the floor as soon as it happens. Remove soap buildup in the tub or shower regularly. Attach bath mats securely with double-sided non-slip rug tape. Do not have throw rugs and other things on the floor that can make you trip. What can I do in the bedroom? Use night  lights. Make sure that you have a light by your bed that is easy to reach. Do not use any sheets or blankets that are too big for your bed. They should not hang down onto the floor. Have a firm chair that has side arms. You can use this for support while you get dressed. Do not have throw rugs and other things on the floor that can make you trip. What can I do in the kitchen? Clean up any spills right away. Avoid walking on wet floors. Keep items that you use a lot in easy-to-reach places. If you need to reach something above you, use a strong step stool that has a grab bar. Keep electrical cords out of the way. Do not use floor polish or wax that makes floors slippery. If you must use wax, use non-skid floor wax. Do not have throw rugs and other things on the floor that can make you trip. What can I do with my stairs? Do not leave any items on the stairs. Make sure that there are handrails on both sides of the stairs and use them. Fix handrails that are broken or loose. Make sure that handrails are as long as the stairways. Check any carpeting to make sure that it is firmly attached to the stairs. Fix any carpet that is loose or worn. Avoid having throw rugs at the top or bottom of the stairs.  If you do have throw rugs, attach them to the floor with carpet tape. Make sure that you have a light switch at the top of the stairs and the bottom of the stairs. If you do not have them, ask someone to add them for you. What else can I do to help prevent falls? Wear shoes that: Do not have high heels. Have rubber bottoms. Are comfortable and fit you well. Are closed at the toe. Do not wear sandals. If you use a stepladder: Make sure that it is fully opened. Do not climb a closed stepladder. Make sure that both sides of the stepladder are locked into place. Ask someone to hold it for you, if possible. Clearly mark and make sure that you can see: Any grab bars or handrails. First and last  steps. Where the edge of each step is. Use tools that help you move around (mobility aids) if they are needed. These include: Canes. Walkers. Scooters. Crutches. Turn on the lights when you go into a dark area. Replace any light bulbs as soon as they burn out. Set up your furniture so you have a clear path. Avoid moving your furniture around. If any of your floors are uneven, fix them. If there are any pets around you, be aware of where they are. Review your medicines with your doctor. Some medicines can make you feel dizzy. This can increase your chance of falling. Ask your doctor what other things that you can do to help prevent falls. This information is not intended to replace advice given to you by your health care provider. Make sure you discuss any questions you have with your health care provider. Document Released: 06/18/2009 Document Revised: 01/28/2016 Document Reviewed: 09/26/2014 Elsevier Interactive Patient Education  2017 [employer].

## 2023-05-26 ENCOUNTER — Telehealth: Payer: Medicare Other

## 2023-05-26 NOTE — Telephone Encounter (Signed)
Gabby from [facility] called and states that patient prescription Levothyroxine has a Health visitor. Per [place] guidelines provider has to call and approve. Is this Ok so I can contact them back and notify them? Message routed to PCP Venita Sheffield, MD

## 2023-05-26 NOTE — Telephone Encounter (Signed)
I called [facility] and notified them.

## 2023-05-31 ENCOUNTER — Other Ambulatory Visit: Payer: Self-pay

## 2023-05-31 ENCOUNTER — Telehealth (INDEPENDENT_AMBULATORY_CARE_PROVIDER_SITE_OTHER): Payer: Medicare Other | Admitting: Sports Medicine

## 2023-05-31 DIAGNOSIS — E039 Hypothyroidism, unspecified: Secondary | ICD-10-CM

## 2023-05-31 NOTE — Telephone Encounter (Signed)
Will check TSH

## 2023-06-23 DIAGNOSIS — R3914 Feeling of incomplete bladder emptying: Secondary | ICD-10-CM | POA: Diagnosis not present

## 2023-07-26 ENCOUNTER — Encounter: Payer: Self-pay | Admitting: Sports Medicine

## 2023-07-26 ENCOUNTER — Ambulatory Visit: Payer: Medicare Other | Admitting: Family Medicine

## 2023-07-26 ENCOUNTER — Ambulatory Visit (INDEPENDENT_AMBULATORY_CARE_PROVIDER_SITE_OTHER): Payer: Medicare Other | Admitting: Sports Medicine

## 2023-07-26 VITALS — BP 130/80 | HR 60 | Temp 97.1°F | Resp 17 | Ht 65.0 in | Wt 196.2 lb

## 2023-07-26 DIAGNOSIS — E1169 Type 2 diabetes mellitus with other specified complication: Secondary | ICD-10-CM

## 2023-07-26 DIAGNOSIS — I10 Essential (primary) hypertension: Secondary | ICD-10-CM

## 2023-07-26 DIAGNOSIS — N400 Enlarged prostate without lower urinary tract symptoms: Secondary | ICD-10-CM

## 2023-07-26 DIAGNOSIS — E039 Hypothyroidism, unspecified: Secondary | ICD-10-CM

## 2023-07-26 DIAGNOSIS — I4891 Unspecified atrial fibrillation: Secondary | ICD-10-CM

## 2023-07-26 DIAGNOSIS — I25110 Atherosclerotic heart disease of native coronary artery with unstable angina pectoris: Secondary | ICD-10-CM | POA: Diagnosis not present

## 2023-07-26 LAB — HM DIABETES EYE EXAM

## 2023-07-26 NOTE — Progress Notes (Signed)
Careteam: Patient Care Team: Venita Sheffield, MD as PCP - General (Internal Medicine) Heloise Purpura, MD as Consulting Physician (Urology) Stephannie Li, MD as Consulting Physician (Ophthalmology) Nelson Chimes, MD as Consulting Physician (Ophthalmology) Chucky May, M.D., PA  PLACE OF SERVICE:  [facility]  Advanced Directive information Does Patient Have a Medical Advance Directive?: Yes, Type of Advance Directive: Healthcare Power of [place];Living will, Does patient want to make changes to medical advance directive?: No - Patient declined  No Known Allergies  Chief Complaint  Patient presents with   Medical Management of Chronic Issues    4 month follow up.    Health Maintenance    Discuss the need for Eye exam, and Urine microalbumin.    Immunizations    Discuss the need for Shingrix vaccine.      HPI: Patient is a 82 y.o. male is here for follow up  Has no concerns today   HTN  130/80  Denies  feeling dizzy or lightheaded Walks  once a week for 30 min    Left foot pain  States that some times his left foot bothers him  Had ankle fracture 2 yrs ago  Intermittent pain  Currently not in pain    Afib  Cad  On eliquis , aspirin, crestor  Denies chest pain  Follows with cardiology    DM Lab Results  Component Value Date   HGBA1C 6.3 (H) 03/22/2023   HGBA1C 6.4 (H) 11/23/2022   HGBA1C 5.9 (H) 02/23/2022   On metformin 500mg    HTN  On losartan  Had blood work done at [facility] last month  Reviewed labs and looks good   BPH  On flomax, finasteride        Review of Systems:  Review of Systems  Constitutional:  Negative for chills and fever.  HENT:  Negative for congestion and sore throat.   Eyes:  Negative for double vision.  Respiratory:  Negative for cough, sputum production and shortness of breath.   Cardiovascular:  Negative for chest pain, palpitations and leg swelling.  Gastrointestinal:  Negative for abdominal pain, heartburn  and nausea.  Genitourinary:  Negative for dysuria, frequency and hematuria.  Musculoskeletal:  Negative for falls and myalgias.  Neurological:  Negative for dizziness, sensory change and focal weakness.    Past Medical History:  Diagnosis Date   Atrial fibrillation (HCC)    Back pain, lumbosacral 09/05/2012   CAD (coronary artery disease), native coronary artery    Cancer of kidney (HCC) 09/05/2010   Cataract 09/06/2011   Diabetes mellitus without complication (HCC) 09/05/2010   Hypertension 09/05/2010   Nephrolithiasis 09/05/2010   Obesity, unspecified    Other and unspecified hyperlipidemia 04/03/2013   Pain, joint, ankle and foot    Unspecified hypothyroidism 09/05/2010   Past Surgical History:  Procedure Laterality Date   ANKLE SURGERY     CATARACT EXTRACTION W/ INTRAOCULAR LENS IMPLANT Bilateral 03/05/2016   Dr. Hazle Quant   CORONARY ANGIOPLASTY WITH STENT PLACEMENT     CYST REMOVAL TRUNK  11/03/2017   KIDNEY SURGERY  12/2010   had cancer removed; Dr. Jerl Mina CUFF REPAIR     bilateral; Dr. Ranell Patrick   Social History:   reports that he has never smoked. He has never used smokeless tobacco. He reports that he does not drink alcohol and does not use drugs.  History reviewed. No pertinent family history.  Medications: Patient's Medications  New Prescriptions   No medications on file  Previous  Medications   APIXABAN (ELIQUIS) 5 MG TABS TABLET    Take 5 mg by mouth 2 (two) times daily.   ASPIRIN 81 PO    Take by mouth.   BLOOD GLUCOSE MONITORING SUPPL (ONE TOUCH ULTRA 2) W/DEVICE KIT    Use to test blood sugar daily. Dx: E11.69   DILTIAZEM (DILACOR XR) 240 MG 24 HR CAPSULE    Take 240 mg by mouth daily.   FINASTERIDE (PROSCAR) 5 MG TABLET    Take 5 mg by mouth daily.   GLUCOSE BLOOD (ONETOUCH ULTRA) TEST STRIP    CHECK BLOOD SUGAR ONCE DAILY   LEVOTHYROXINE (SYNTHROID) 50 MCG TABLET    TAKE 1 TABLET BY MOUTH DAILY  BEFORE BREAKFAST   LOSARTAN (COZAAR) 25 MG TABLET     Take 25 mg by mouth daily.   METFORMIN (GLUCOPHAGE) 500 MG TABLET    TAKE 1 TABLET BY MOUTH TWICE  DAILY TO CONTROL DIABETES   OMEPRAZOLE (PRILOSEC) 20 MG CAPSULE    TAKE 1 CAPSULE BY MOUTH DAILY   ONETOUCH DELICA LANCETS FINE MISC    USE TO CHECK BLOOD SUGAR ONCE DAILY   ROSUVASTATIN (CRESTOR) 10 MG TABLET    Take 10 mg by mouth daily.   SOTALOL (BETAPACE) 80 MG TABLET    Take 80 mg by mouth 2 (two) times daily.   TAMSULOSIN (FLOMAX) 0.4 MG CAPS CAPSULE    Take 0.4 mg by mouth daily.  Modified Medications   No medications on file  Discontinued Medications   No medications on file    Physical Exam:  Vitals:   07/26/23 0900  BP: 130/80  Pulse: 60  Resp: 17  Temp: (!) 97.1 F (36.2 C)  SpO2: 96%  Weight: 196 lb 3.2 oz (89 kg)  Height: 5\' 5"  (1.651 m)   Body mass index is 32.65 kg/m. Wt Readings from Last 3 Encounters:  07/26/23 196 lb 3.2 oz (89 kg)  04/17/23 206 lb 12.8 oz (93.8 kg)  03/22/23 213 lb (96.6 kg)    Physical Exam Constitutional:      Appearance: Normal appearance.  HENT:     Head: Normocephalic and atraumatic.  Cardiovascular:     Rate and Rhythm: Normal rate and regular rhythm.     Pulses: Normal pulses.     Heart sounds: Normal heart sounds.  Pulmonary:     Effort: No respiratory distress.     Breath sounds: No stridor. No wheezing or rales.  Abdominal:     General: Bowel sounds are normal. There is no distension.     Palpations: Abdomen is soft.     Tenderness: There is no abdominal tenderness. There is no right CVA tenderness or guarding.  Musculoskeletal:        General: No swelling.  Neurological:     Mental Status: He is alert. Mental status is at baseline.     Sensory: No sensory deficit.     Motor: No weakness.     Labs reviewed: Basic Metabolic Panel: Recent Labs    11/23/22 1153 03/22/23 0959  NA 139 139  K 4.5 4.7  CL 102 105  CO2 24 27  GLUCOSE 118* 105*  BUN 15 16  CREATININE 1.10 1.03  CALCIUM 9.4 9.6   Liver  Function Tests: Recent Labs    03/22/23 0959  AST 15  ALT 15  BILITOT 0.5  PROT 6.5   No results for input(s): "LIPASE", "AMYLASE" in the last 8760 hours. No results for input(s): "AMMONIA" in  the last 8760 hours. CBC: No results for input(s): "WBC", "NEUTROABS", "HGB", "HCT", "MCV", "PLT" in the last 8760 hours. Lipid Panel: Recent Labs    03/22/23 0959  CHOL 144  HDL 53  LDLCALC 66  TRIG 175*  CHOLHDL 2.7   TSH: No results for input(s): "TSH" in the last 8760 hours. A1C: Lab Results  Component Value Date   HGBA1C 6.3 (H) 03/22/2023     Assessment/Plan  1. Acquired hypothyroidism Will check TSH and adjust dose accordingly  2. Type 2 diabetes mellitus with other specified complication, without long-term current use of insulin (HCC) A1c at goal  6.3 Cont with metformin Instructed patient to exercise regularly - Microalbumin/Creatinine Ratio, Urine  3. Atrial fibrillation, unspecified type (HCC) Rate controlled No signs of bleeding  Cont with eliquis, cardizem   4. Coronary artery disease involving native coronary artery of native heart with unstable angina pectoris (HCC) Denies chest pain Cont with aspirin, crestor  5. Primary hypertension Bp at goal  Cont with  losartan  6. Benign prostatic hyperplasia, unspecified whether lower urinary tract symptoms present Cont with flomax, finasteride   Return in about 3 months (around 10/26/2023).:

## 2023-07-27 LAB — TSH: TSH: 1.79 m[IU]/L (ref 0.40–4.50)

## 2023-07-27 LAB — MICROALBUMIN / CREATININE URINE RATIO
Creatinine, Urine: 96 mg/dL (ref 20–320)
Microalb Creat Ratio: 6 mg/g{creat} (ref <30)
Microalb, Ur: 0.6 mg/dL

## 2023-07-27 NOTE — Progress Notes (Signed)
Plz call the patient and inform that blood work and urine test looks good.

## 2023-08-06 ENCOUNTER — Other Ambulatory Visit: Payer: Self-pay | Admitting: Nurse Practitioner

## 2023-08-06 DIAGNOSIS — E039 Hypothyroidism, unspecified: Secondary | ICD-10-CM

## 2023-09-04 DIAGNOSIS — Z961 Presence of intraocular lens: Secondary | ICD-10-CM | POA: Diagnosis not present

## 2023-09-04 DIAGNOSIS — E119 Type 2 diabetes mellitus without complications: Secondary | ICD-10-CM | POA: Diagnosis not present

## 2023-09-04 DIAGNOSIS — H26493 Other secondary cataract, bilateral: Secondary | ICD-10-CM | POA: Diagnosis not present

## 2023-09-04 DIAGNOSIS — H348322 Tributary (branch) retinal vein occlusion, left eye, stable: Secondary | ICD-10-CM | POA: Diagnosis not present

## 2023-10-04 ENCOUNTER — Encounter: Payer: Self-pay | Admitting: Sports Medicine

## 2023-10-04 ENCOUNTER — Ambulatory Visit (INDEPENDENT_AMBULATORY_CARE_PROVIDER_SITE_OTHER): Payer: Medicare Other | Admitting: Sports Medicine

## 2023-10-04 VITALS — BP 126/70 | HR 60 | Temp 97.6°F | Resp 17 | Ht 65.0 in | Wt 207.4 lb

## 2023-10-04 DIAGNOSIS — I10 Essential (primary) hypertension: Secondary | ICD-10-CM | POA: Diagnosis not present

## 2023-10-04 DIAGNOSIS — K0510 Chronic gingivitis, plaque induced: Secondary | ICD-10-CM

## 2023-10-04 DIAGNOSIS — R21 Rash and other nonspecific skin eruption: Secondary | ICD-10-CM | POA: Diagnosis not present

## 2023-10-04 MED ORDER — PREDNISONE 20 MG PO TABS: 40.0000 mg | ORAL_TABLET | Freq: Every day | ORAL | 0 refills | Status: DC

## 2023-10-04 NOTE — Progress Notes (Signed)
Nicholas Rivera: Patient Care Team: Venita Sheffield, MD as PCP - General (Internal Medicine) Heloise Purpura, MD as Consulting Physician (Urology) Stephannie Li, MD as Consulting Physician (Ophthalmology) Nelson Chimes, MD as Consulting Physician (Ophthalmology) Chucky May, M.D., PA  PLACE OF SERVICE:  [facility]  Advanced Directive information Does Patient Have a Medical Advance Directive?: Yes, Type of Advance Directive: Healthcare Power of [name];Living will, Does patient want to make changes to medical advance directive?: No - Patient declined  No Known Allergies  Chief Complaint  Patient presents with   Acute Visit    Patient complains of itchy rash on both legs.     Discussed the use of AI scribe software for clinical note transcription with the patient, who gave verbal consent to proceed.  History of Present Illness   The patient presents with a rash on the legs and thighs.  The rash began around the ankles and has spread to the thighs over the past week. pt states it does not cause itch but sometimes cause a burning sensation at night, particularly when his legs are together in bed. The rash is more pronounced on the thighs and lower legs.  He recently started wearing new white socks that have not been washed and also uses high socks designed for diabetics. He has a history of sunburn on his ankles from last summer, which previously caused discomfort. He has not started any new medications recently, except for an RSV shot and a tetanus booster received three weeks ago. He has been on Eliquis for several years without prior issues. No recent travel or hiking.  No itching, fever, swelling of the lips or tongue, abdominal pain, nausea, vomiting, pain with urination, or blood in the urine or stool. He reports occasional gum bleeding and has a history of dental issues, including a broken tooth that may need surgical removal.    He recently moved to a new apartment and  has been applying olive oil to the rash. He has not tried any steroid or antihistamine creams.         Review of Systems:  Review of Systems  Constitutional:  Negative for chills and fever.  HENT:  Negative for congestion and sore throat.   Eyes:  Negative for double vision.  Respiratory:  Negative for cough, sputum production and shortness of breath.   Cardiovascular:  Negative for chest pain, palpitations and leg swelling.  Gastrointestinal:  Negative for abdominal pain, heartburn and nausea.  Genitourinary:  Negative for dysuria, frequency and hematuria.  Musculoskeletal:  Negative for falls and myalgias.  Skin:  Positive for rash.  Neurological:  Negative for dizziness, sensory change and focal weakness.   Negative unless indicated in HPI.   Past Medical History:  Diagnosis Date   Atrial fibrillation (HCC)    Back pain, lumbosacral 09/05/2012   CAD (coronary artery disease), native coronary artery    Cancer of kidney (HCC) 09/05/2010   Cataract 09/06/2011   Diabetes mellitus without complication (HCC) 09/05/2010   Hypertension 09/05/2010   Nephrolithiasis 09/05/2010   Obesity, unspecified    Other and unspecified hyperlipidemia 04/03/2013   Pain, joint, ankle and foot    Unspecified hypothyroidism 09/05/2010   Past Surgical History:  Procedure Laterality Date   ANKLE SURGERY     CATARACT EXTRACTION W/ INTRAOCULAR LENS IMPLANT Bilateral 03/05/2016   Dr. Hazle Quant   CORONARY ANGIOPLASTY WITH STENT PLACEMENT     CYST REMOVAL TRUNK  11/03/2017   KIDNEY SURGERY  12/2010  had cancer removed; Dr. Jerl Mina CUFF REPAIR     bilateral; Dr. Ranell Patrick   Social History:   reports that he has never smoked. He has never used smokeless tobacco. He reports that he does not drink alcohol and does not use drugs.  History reviewed. No pertinent family history.  Medications: Patient's Medications  New Prescriptions   No medications on file  Previous Medications   APIXABAN  (ELIQUIS) 5 MG TABS TABLET    Take 5 mg by mouth 2 (two) times daily.   ASCORBIC ACID (VITAMIN C) 1000 MG TABLET    Take 1,000 mg by mouth daily.   ASPIRIN 81 PO    Take by mouth.   BLOOD GLUCOSE MONITORING SUPPL (ONE TOUCH ULTRA 2) W/DEVICE KIT    Use to test blood sugar daily. Dx: E11.69   CHOLECALCIFEROL (VITAMIN D3) 50 MCG (2000 UT) TABS    Take 1 tablet by mouth daily.   DILTIAZEM (DILACOR XR) 240 MG 24 HR CAPSULE    Take 240 mg by mouth daily.   FINASTERIDE (PROSCAR) 5 MG TABLET    Take 5 mg by mouth daily.   GLUCOSE BLOOD (ONETOUCH ULTRA) TEST STRIP    CHECK BLOOD SUGAR ONCE DAILY   LEVOTHYROXINE (SYNTHROID) 50 MCG TABLET    TAKE 1 TABLET BY MOUTH DAILY  BEFORE BREAKFAST   LOSARTAN (COZAAR) 25 MG TABLET    Take 25 mg by mouth daily.   METFORMIN (GLUCOPHAGE) 500 MG TABLET    TAKE 1 TABLET BY MOUTH TWICE  DAILY TO CONTROL DIABETES   MULTIPLE VITAMINS-MINERALS (MENS MULTIVITAMIN PO)    Take 1 tablet by mouth daily.   OMEPRAZOLE (PRILOSEC) 20 MG CAPSULE    TAKE 1 CAPSULE BY MOUTH DAILY   ONETOUCH DELICA LANCETS FINE MISC    USE TO CHECK BLOOD SUGAR ONCE DAILY   ROSUVASTATIN (CRESTOR) 10 MG TABLET    Take 10 mg by mouth daily.   SOTALOL (BETAPACE) 80 MG TABLET    Take 80 mg by mouth 2 (two) times daily.   TAMSULOSIN (FLOMAX) 0.4 MG CAPS CAPSULE    Take 0.4 mg by mouth daily.  Modified Medications   No medications on file  Discontinued Medications   No medications on file    Physical Exam: Vitals:   10/04/23 1033  BP: (!) 142/76  Pulse: 60  Resp: 17  Temp: 97.6 F (36.4 C)  SpO2: 96%  Weight: 207 lb 6.4 oz (94.1 kg)  Height: 5\' 5"  (1.651 m)   Body mass index is 34.51 kg/m. BP Readings from Last 3 Encounters:  10/04/23 (!) 142/76  07/26/23 130/80  04/17/23 133/78   Wt Readings from Last 3 Encounters:  10/04/23 207 lb 6.4 oz (94.1 kg)  07/26/23 196 lb 3.2 oz (89 kg)  04/17/23 206 lb 12.8 oz (93.8 kg)    Physical Exam Constitutional:      Appearance: Normal appearance.   HENT:     Head: Normocephalic and atraumatic.  Cardiovascular:     Rate and Rhythm: Normal rate and regular rhythm.     Pulses: Normal pulses.     Heart sounds: Normal heart sounds.  Pulmonary:     Effort: No respiratory distress.     Breath sounds: No stridor. No wheezing or rales.  Abdominal:     General: Bowel sounds are normal. There is no distension.     Palpations: Abdomen is soft.     Tenderness: There is no abdominal tenderness. There is  no right CVA tenderness or guarding.  Musculoskeletal:        General: No swelling.  Skin:    Findings: Rash present.     Comments: Flat area of erythema with tiny red bumps on both his medial thigh and some on his lower legs ( which is improving)  Neurological:     Mental Status: He is alert. Mental status is at baseline.     Sensory: No sensory deficit.     Motor: No weakness.     Labs reviewed: Basic Metabolic Panel: Recent Labs    11/23/22 1153 03/22/23 0959 07/26/23 0939  NA 139 139  --   K 4.5 4.7  --   CL 102 105  --   CO2 24 27  --   GLUCOSE 118* 105*  --   BUN 15 16  --   CREATININE 1.10 1.03  --   CALCIUM 9.4 9.6  --   TSH  --   --  1.79   Liver Function Tests: Recent Labs    03/22/23 0959  AST 15  ALT 15  BILITOT 0.5  PROT 6.5   No results for input(s): "LIPASE", "AMYLASE" in the last 8760 hours. No results for input(s): "AMMONIA" in the last 8760 hours. CBC: No results for input(s): "WBC", "NEUTROABS", "HGB", "HCT", "MCV", "PLT" in the last 8760 hours. Lipid Panel: Recent Labs    03/22/23 0959  CHOL 144  HDL 53  LDLCALC 66  TRIG 175*  CHOLHDL 2.7   TSH: Recent Labs    07/26/23 0939  TSH 1.79   A1C: Lab Results  Component Value Date   HGBA1C 6.3 (H) 03/22/2023     Assessment/Plan  Rash New onset rash on both legs and thighs, non-pruritic but with occasional burning sensation. No recent travel, new medications, or known exposures. Not consistent with shingles   -Start Prednisone for  rash. -Check blood counts to rule out low platelets as a cause for rash. -Advise patient to apply moisturizer twice daily for dry skin.  Oral Health Patient reports occasional gum bleeding and has visible dental issues. -Recommend dental consultation for evaluation and treatment.  HTN  Repeat bp improved   General Health Maintenance -Continue current medications as prescribed. -Advise patient to monitor for any systemic allergic reaction symptoms such as swelling of lips or tongue, and shortness of breath, and to seek immediate medical attention if these occur.

## 2023-10-05 ENCOUNTER — Telehealth: Payer: Self-pay

## 2023-10-05 LAB — COMPLETE METABOLIC PANEL WITH GFR
AG Ratio: 1.9 (calc) (ref 1.0–2.5)
ALT: 28 U/L (ref 9–46)
AST: 21 U/L (ref 10–35)
Albumin: 4.3 g/dL (ref 3.6–5.1)
Alkaline phosphatase (APISO): 92 U/L (ref 35–144)
BUN: 15 mg/dL (ref 7–25)
CO2: 24 mmol/L (ref 20–32)
Calcium: 9.7 mg/dL (ref 8.6–10.3)
Chloride: 105 mmol/L (ref 98–110)
Creat: 1.07 mg/dL (ref 0.70–1.22)
Globulin: 2.3 g/dL (ref 1.9–3.7)
Glucose, Bld: 101 mg/dL — ABNORMAL HIGH (ref 65–99)
Potassium: 4.6 mmol/L (ref 3.5–5.3)
Sodium: 139 mmol/L (ref 135–146)
Total Bilirubin: 0.4 mg/dL (ref 0.2–1.2)
Total Protein: 6.6 g/dL (ref 6.1–8.1)
eGFR: 70 mL/min/{1.73_m2} (ref >=60)

## 2023-10-05 LAB — CBC WITH DIFFERENTIAL/PLATELET
Absolute Lymphocytes: 2406 {cells}/uL (ref 850–3900)
Absolute Monocytes: 752 {cells}/uL (ref 200–950)
Basophils Absolute: 141 {cells}/uL (ref 0–200)
Basophils Relative: 1.5 %
Eosinophils Absolute: 714 {cells}/uL — ABNORMAL HIGH (ref 15–500)
Eosinophils Relative: 7.6 %
HCT: 41.6 % (ref 38.5–50.0)
Hemoglobin: 13.1 g/dL — ABNORMAL LOW (ref 13.2–17.1)
MCH: 28.2 pg (ref 27.0–33.0)
MCHC: 31.5 g/dL — ABNORMAL LOW (ref 32.0–36.0)
MCV: 89.5 fL (ref 80.0–100.0)
MPV: 10.6 fL (ref 7.5–12.5)
Monocytes Relative: 8 %
Neutro Abs: 5386 {cells}/uL (ref 1500–7800)
Neutrophils Relative %: 57.3 %
Platelets: 311 10*3/uL (ref 140–400)
RBC: 4.65 10*6/uL (ref 4.20–5.80)
RDW: 13 % (ref 11.0–15.0)
Total Lymphocyte: 25.6 %
WBC: 9.4 10*3/uL (ref 3.8–10.8)

## 2023-10-05 NOTE — Telephone Encounter (Signed)
Patient called for an status update about his labs. Patient was advised that we will get back with him as soon as they are resulted by his PCP.

## 2023-10-08 ENCOUNTER — Other Ambulatory Visit: Payer: Self-pay | Admitting: Family Medicine

## 2023-10-08 DIAGNOSIS — E119 Type 2 diabetes mellitus without complications: Secondary | ICD-10-CM

## 2023-11-21 ENCOUNTER — Ambulatory Visit (INDEPENDENT_AMBULATORY_CARE_PROVIDER_SITE_OTHER): Payer: Medicare Other | Admitting: Sports Medicine

## 2023-11-21 ENCOUNTER — Encounter: Payer: Self-pay | Admitting: Sports Medicine

## 2023-11-21 VITALS — BP 110/80 | HR 64 | Temp 97.3°F | Resp 17 | Ht 65.0 in | Wt 217.4 lb

## 2023-11-21 DIAGNOSIS — F5101 Primary insomnia: Secondary | ICD-10-CM

## 2023-11-21 DIAGNOSIS — I4891 Unspecified atrial fibrillation: Secondary | ICD-10-CM | POA: Diagnosis not present

## 2023-11-21 DIAGNOSIS — E782 Mixed hyperlipidemia: Secondary | ICD-10-CM | POA: Diagnosis not present

## 2023-11-21 DIAGNOSIS — Z85528 Personal history of other malignant neoplasm of kidney: Secondary | ICD-10-CM | POA: Diagnosis not present

## 2023-11-21 DIAGNOSIS — E039 Hypothyroidism, unspecified: Secondary | ICD-10-CM | POA: Diagnosis not present

## 2023-11-21 DIAGNOSIS — I10 Essential (primary) hypertension: Secondary | ICD-10-CM

## 2023-11-21 DIAGNOSIS — E1169 Type 2 diabetes mellitus with other specified complication: Secondary | ICD-10-CM

## 2023-11-21 NOTE — Progress Notes (Signed)
 Careteam: Patient Care Team: Venita Sheffield, MD as PCP - General (Internal Medicine) Heloise Purpura, MD as Consulting Physician (Urology) Stephannie Li, MD as Consulting Physician (Ophthalmology) Nelson Chimes, MD as Consulting Physician (Ophthalmology) Chucky May, M.D., PA  PLACE OF SERVICE:  [facility]  Advanced Directive information    No Known Allergies  Chief Complaint  Patient presents with   Medical Management of Chronic Issues    4 month follow up.    Immunizations    Discuss the need for Shingrix vaccine.    Health Maintenance    Discuss the need for Eye exam, and Hemoglobin A1C.      Discussed the use of AI scribe software for clinical note transcription with the patient, who gave verbal consent to proceed.  History of Present Illness   Nicholas Rivera is an 83 year old male with a history of type 2 diabetes, atrial fibrillation, and renal cell carcinoma who presents for a regular follow-up visit.  He is currently on multiple medications including diltiazem 240 mg, losartan 25 mg, sotalol, metformin, rosuvastatin 10 mg, Eliquis, and a baby aspirin. No dizziness, lightheadedness, chest pain, or shortness of breath with exertion. No muscle aches or cramping associated with his cholesterol medication.  His type 2 diabetes is well-controlled with a stable A1c of 6.3. He takes metformin twice daily, one in the morning and one in the evening. No tingling or numbness in his feet and maintains a good appetite.  He has a history of atrial fibrillation and is on sotalol and Eliquis. He bruises easily but has no major bleeding events, although he notes prolonged bleeding when cut, which he attributes to the blood thinner. He has stopped shaving due to difficulty managing bleeding from cuts.  He has a history of renal cell carcinoma and sees a urologist annually, with the last visit being in August of the previous year, which was uneventful.  He walks 30 minutes  a day, five times a week, and denies any falls in the last six months. He experiences occasional heartburn and acid reflux, but no burning pain in the stomach or urinary symptoms. He has regular bowel movements, sometimes twice a day, and occasional straining. He recently moved to a new apartment and has gained about ten pounds, partly due to his neighbor being a Engineer, production and his own baking habits. He wants to lose weight, having previously reached a weight he was comfortable with.  He wakes up at night to use the bathroom and sometimes has difficulty returning to sleep. He typically goes to bed around 11 PM and wakes up around 3 AM, often staying in bed until 9 AM. He occasionally takes naps during the day.         Review of Systems:  Review of Systems  Constitutional:  Negative for chills and fever.  HENT:  Negative for congestion and sore throat.   Eyes:  Negative for double vision.  Respiratory:  Negative for cough, sputum production and shortness of breath.   Cardiovascular:  Negative for chest pain, palpitations and leg swelling.  Gastrointestinal:  Negative for abdominal pain, heartburn and nausea.  Genitourinary:  Negative for dysuria, frequency and hematuria.  Musculoskeletal:  Negative for falls and myalgias.  Neurological:  Negative for dizziness, sensory change and focal weakness.   Negative unless indicated in HPI.   Past Medical History:  Diagnosis Date   Atrial fibrillation (HCC)    Back pain, lumbosacral 09/05/2012   CAD (coronary artery disease), native  coronary artery    Cancer of kidney (HCC) 09/05/2010   Cataract 09/06/2011   Diabetes mellitus without complication (HCC) 09/05/2010   Hypertension 09/05/2010   Nephrolithiasis 09/05/2010   Obesity, unspecified    Other and unspecified hyperlipidemia 04/03/2013   Pain, joint, ankle and foot    Unspecified hypothyroidism 09/05/2010   Past Surgical History:  Procedure Laterality Date   ANKLE SURGERY     CATARACT  EXTRACTION W/ INTRAOCULAR LENS IMPLANT Bilateral 03/05/2016   Dr. Hazle Quant   CORONARY ANGIOPLASTY WITH STENT PLACEMENT     CYST REMOVAL TRUNK  11/03/2017   KIDNEY SURGERY  12/2010   had cancer removed; Dr. Jerl Mina CUFF REPAIR     bilateral; Dr. Ranell Patrick   Social History:   reports that he has never smoked. He has never used smokeless tobacco. He reports that he does not drink alcohol and does not use drugs.  History reviewed. No pertinent family history.  Medications: Patient's Medications  New Prescriptions   No medications on file  Previous Medications   APIXABAN (ELIQUIS) 5 MG TABS TABLET    Take 5 mg by mouth 2 (two) times daily.   ASCORBIC ACID (VITAMIN C) 1000 MG TABLET    Take 1,000 mg by mouth daily.   ASPIRIN 81 PO    Take by mouth.   BLOOD GLUCOSE MONITORING SUPPL (ONE TOUCH ULTRA 2) W/DEVICE KIT    Use to test blood sugar daily. Dx: E11.69   CHOLECALCIFEROL (VITAMIN D3) 50 MCG (2000 UT) TABS    Take 1 tablet by mouth daily.   DILTIAZEM (DILACOR XR) 240 MG 24 HR CAPSULE    Take 240 mg by mouth daily.   FINASTERIDE (PROSCAR) 5 MG TABLET    Take 5 mg by mouth daily.   GLUCOSE BLOOD (ONETOUCH ULTRA) TEST STRIP    CHECK BLOOD SUGAR DAILY   LEVOTHYROXINE (SYNTHROID) 50 MCG TABLET    TAKE 1 TABLET BY MOUTH DAILY  BEFORE BREAKFAST   LOSARTAN (COZAAR) 25 MG TABLET    Take 25 mg by mouth daily.   METFORMIN (GLUCOPHAGE) 500 MG TABLET    TAKE 1 TABLET BY MOUTH TWICE  DAILY TO CONTROL DIABETES   MULTIPLE VITAMINS-MINERALS (MENS MULTIVITAMIN PO)    Take 1 tablet by mouth daily.   OMEPRAZOLE (PRILOSEC) 20 MG CAPSULE    TAKE 1 CAPSULE BY MOUTH DAILY   ONETOUCH DELICA LANCETS FINE MISC    USE TO CHECK BLOOD SUGAR ONCE DAILY   ROSUVASTATIN (CRESTOR) 10 MG TABLET    Take 10 mg by mouth daily.   SOTALOL (BETAPACE) 80 MG TABLET    Take 80 mg by mouth 2 (two) times daily.   TAMSULOSIN (FLOMAX) 0.4 MG CAPS CAPSULE    Take 0.4 mg by mouth daily.  Modified Medications   No medications on  file  Discontinued Medications   PREDNISONE (DELTASONE) 20 MG TABLET    Take 2 tablets (40 mg total) by mouth daily with breakfast.    Physical Exam: Vitals:   11/21/23 0853  BP: 110/80  Pulse: 64  Resp: 17  Temp: (!) 97.3 F (36.3 C)  SpO2: 96%  Weight: 217 lb 6.4 oz (98.6 kg)  Height: 5\' 5"  (1.651 m)   Body mass index is 36.18 kg/m. BP Readings from Last 3 Encounters:  11/21/23 110/80  10/04/23 126/70  07/26/23 130/80   Wt Readings from Last 3 Encounters:  11/21/23 217 lb 6.4 oz (98.6 kg)  10/04/23 207 lb 6.4 oz (  94.1 kg)  07/26/23 196 lb 3.2 oz (89 kg)    Physical Exam Constitutional:      Appearance: Normal appearance.  HENT:     Head: Normocephalic and atraumatic.  Cardiovascular:     Rate and Rhythm: Normal rate and regular rhythm.     Pulses: Normal pulses.     Heart sounds: Normal heart sounds.  Pulmonary:     Effort: No respiratory distress.     Breath sounds: No stridor. No wheezing or rales.  Abdominal:     General: Bowel sounds are normal. There is no distension.     Palpations: Abdomen is soft.     Tenderness: There is no abdominal tenderness. There is no right CVA tenderness or guarding.  Musculoskeletal:        General: No swelling.  Neurological:     Mental Status: He is alert. Mental status is at baseline.     Motor: No weakness.     Labs reviewed: Basic Metabolic Panel: Recent Labs    11/23/22 1153 03/22/23 0959 07/26/23 0939 10/04/23 1107  NA 139 139  --  139  K 4.5 4.7  --  4.6  CL 102 105  --  105  CO2 24 27  --  24  GLUCOSE 118* 105*  --  101*  BUN 15 16  --  15  CREATININE 1.10 1.03  --  1.07  CALCIUM 9.4 9.6  --  9.7  TSH  --   --  1.79  --    Liver Function Tests: Recent Labs    03/22/23 0959 10/04/23 1107  AST 15 21  ALT 15 28  BILITOT 0.5 0.4  PROT 6.5 6.6   No results for input(s): "LIPASE", "AMYLASE" in the last 8760 hours. No results for input(s): "AMMONIA" in the last 8760 hours. CBC: Recent Labs     10/04/23 1107  WBC 9.4  NEUTROABS 5,386  HGB 13.1*  HCT 41.6  MCV 89.5  PLT 311   Lipid Panel: Recent Labs    03/22/23 0959  CHOL 144  HDL 53  LDLCALC 66  TRIG 175*  CHOLHDL 2.7   TSH: Recent Labs    07/26/23 0939  TSH 1.79   A1C: Lab Results  Component Value Date   HGBA1C 6.3 (H) 03/22/2023    Assessment and Plan    Atrial Fibrillation Managed with sotalol and Eliquis. No major bleeding, but prolonged bleeding with cuts and bruising noted. - Continue sotalol. - Continue Eliquis.    Hypertension Blood pressure well-controlled on diltiazem and losartan. Stable even after missed dose. - Continue diltiazem 240 mg daily. - Continue losartan 25 mg daily.  Type 2 Diabetes Mellitus A1c at 6.3%, well controlled - Continue metformin,   - Order A1c test.  Hyperlipidemia Managed with rosuvastatin. No muscle aches or cramps reported. - Continue rosuvastatin 10 mg nightly.  Renal Cell Carcinoma Annual urology follow-up with no issues reported last August. - Continue annual urology follow-up.  Sleep Disturbance Wakes at 3 AM, difficulty returning to sleep. Daytime naps and coffee intake may affect sleep quality. - Advise against daytime naps, especially after 12 or 1 PM. - Limit coffee intake to one cup in the morning. Take melatonin at bed time      Acquired hypothyroidism Lab Results  Component Value Date   TSH 1.79 07/26/2023   Will check thyroid and adjust dose accordingly        Return in about 4 months (around 03/22/2024).:

## 2023-11-22 ENCOUNTER — Other Ambulatory Visit: Payer: Self-pay | Admitting: Sports Medicine

## 2023-11-22 DIAGNOSIS — E119 Type 2 diabetes mellitus without complications: Secondary | ICD-10-CM

## 2023-11-22 LAB — HEMOGLOBIN A1C
Hgb A1c MFr Bld: 6.3 %{Hb} — ABNORMAL HIGH (ref <5.7)
Mean Plasma Glucose: 134 mg/dL
eAG (mmol/L): 7.4 mmol/L

## 2023-11-22 LAB — TSH: TSH: 3.38 m[IU]/L (ref 0.40–4.50)

## 2023-11-22 MED ORDER — METFORMIN HCL 500 MG PO TABS: ORAL_TABLET | ORAL | Status: DC

## 2023-11-23 ENCOUNTER — Telehealth: Payer: Self-pay

## 2023-11-23 NOTE — Telephone Encounter (Signed)
 Copied from CRM [id number]. Topic: Clinical - Lab/Test Results >> Nov 23, 2023 12:55 PM Alvino Blood C wrote: Reason for CRM: Patient would like a call back at [phone] to discuss his lab result  Discussed results with patient, patient verbalized understanding of results

## 2023-12-28 DIAGNOSIS — I10 Essential (primary) hypertension: Secondary | ICD-10-CM | POA: Diagnosis not present

## 2023-12-28 DIAGNOSIS — I2510 Atherosclerotic heart disease of native coronary artery without angina pectoris: Secondary | ICD-10-CM | POA: Diagnosis not present

## 2023-12-28 DIAGNOSIS — E785 Hyperlipidemia, unspecified: Secondary | ICD-10-CM | POA: Diagnosis not present

## 2023-12-28 DIAGNOSIS — I4891 Unspecified atrial fibrillation: Secondary | ICD-10-CM | POA: Diagnosis not present

## 2023-12-28 DIAGNOSIS — R609 Edema, unspecified: Secondary | ICD-10-CM | POA: Diagnosis not present

## 2024-01-16 DIAGNOSIS — K5731 Diverticulosis of large intestine without perforation or abscess with bleeding: Secondary | ICD-10-CM | POA: Diagnosis not present

## 2024-01-16 DIAGNOSIS — I10 Essential (primary) hypertension: Secondary | ICD-10-CM | POA: Diagnosis not present

## 2024-01-16 DIAGNOSIS — E119 Type 2 diabetes mellitus without complications: Secondary | ICD-10-CM | POA: Diagnosis not present

## 2024-01-16 DIAGNOSIS — I2510 Atherosclerotic heart disease of native coronary artery without angina pectoris: Secondary | ICD-10-CM | POA: Diagnosis not present

## 2024-01-16 DIAGNOSIS — D62 Acute posthemorrhagic anemia: Secondary | ICD-10-CM | POA: Diagnosis not present

## 2024-01-16 DIAGNOSIS — I700 Atherosclerosis of aorta: Secondary | ICD-10-CM | POA: Diagnosis not present

## 2024-01-16 DIAGNOSIS — K219 Gastro-esophageal reflux disease without esophagitis: Secondary | ICD-10-CM | POA: Diagnosis not present

## 2024-01-16 DIAGNOSIS — I480 Paroxysmal atrial fibrillation: Secondary | ICD-10-CM | POA: Diagnosis not present

## 2024-01-16 DIAGNOSIS — Z7901 Long term (current) use of anticoagulants: Secondary | ICD-10-CM | POA: Diagnosis not present

## 2024-01-16 DIAGNOSIS — I482 Chronic atrial fibrillation, unspecified: Secondary | ICD-10-CM | POA: Diagnosis not present

## 2024-01-16 DIAGNOSIS — Z79899 Other long term (current) drug therapy: Secondary | ICD-10-CM | POA: Diagnosis not present

## 2024-01-16 DIAGNOSIS — K625 Hemorrhage of anus and rectum: Secondary | ICD-10-CM | POA: Diagnosis not present

## 2024-01-16 DIAGNOSIS — Z859 Personal history of malignant neoplasm, unspecified: Secondary | ICD-10-CM | POA: Diagnosis not present

## 2024-01-16 DIAGNOSIS — R58 Hemorrhage, not elsewhere classified: Secondary | ICD-10-CM | POA: Diagnosis not present

## 2024-01-16 DIAGNOSIS — I4891 Unspecified atrial fibrillation: Secondary | ICD-10-CM | POA: Diagnosis not present

## 2024-01-16 DIAGNOSIS — N281 Cyst of kidney, acquired: Secondary | ICD-10-CM | POA: Diagnosis not present

## 2024-01-16 DIAGNOSIS — E785 Hyperlipidemia, unspecified: Secondary | ICD-10-CM | POA: Diagnosis not present

## 2024-01-16 DIAGNOSIS — D649 Anemia, unspecified: Secondary | ICD-10-CM | POA: Diagnosis not present

## 2024-01-16 DIAGNOSIS — Z7984 Long term (current) use of oral hypoglycemic drugs: Secondary | ICD-10-CM | POA: Diagnosis not present

## 2024-01-16 DIAGNOSIS — E039 Hypothyroidism, unspecified: Secondary | ICD-10-CM | POA: Diagnosis not present

## 2024-01-16 DIAGNOSIS — R578 Other shock: Secondary | ICD-10-CM | POA: Diagnosis not present

## 2024-01-16 DIAGNOSIS — K8020 Calculus of gallbladder without cholecystitis without obstruction: Secondary | ICD-10-CM | POA: Diagnosis not present

## 2024-01-16 DIAGNOSIS — K922 Gastrointestinal hemorrhage, unspecified: Secondary | ICD-10-CM | POA: Diagnosis not present

## 2024-01-16 DIAGNOSIS — I959 Hypotension, unspecified: Secondary | ICD-10-CM | POA: Diagnosis not present

## 2024-01-30 ENCOUNTER — Encounter: Payer: Self-pay | Admitting: Sports Medicine

## 2024-01-30 ENCOUNTER — Ambulatory Visit (INDEPENDENT_AMBULATORY_CARE_PROVIDER_SITE_OTHER): Admitting: Sports Medicine

## 2024-01-30 VITALS — BP 142/78 | HR 60 | Temp 98.1°F | Ht 65.0 in | Wt 215.4 lb

## 2024-01-30 DIAGNOSIS — Z09 Encounter for follow-up examination after completed treatment for conditions other than malignant neoplasm: Secondary | ICD-10-CM

## 2024-01-30 DIAGNOSIS — E1169 Type 2 diabetes mellitus with other specified complication: Secondary | ICD-10-CM | POA: Diagnosis not present

## 2024-01-30 DIAGNOSIS — K5793 Diverticulitis of intestine, part unspecified, without perforation or abscess with bleeding: Secondary | ICD-10-CM

## 2024-01-30 DIAGNOSIS — D649 Anemia, unspecified: Secondary | ICD-10-CM | POA: Diagnosis not present

## 2024-01-30 DIAGNOSIS — I4891 Unspecified atrial fibrillation: Secondary | ICD-10-CM | POA: Diagnosis not present

## 2024-01-30 LAB — CBC
HCT: 33.4 % — ABNORMAL LOW (ref 38.5–50.0)
Hemoglobin: 10.6 g/dL — ABNORMAL LOW (ref 13.2–17.1)
MCH: 28 pg (ref 27.0–33.0)
MCHC: 31.7 g/dL — ABNORMAL LOW (ref 32.0–36.0)
MCV: 88.4 fL (ref 80.0–100.0)
MPV: 10.1 fL (ref 7.5–12.5)
Platelets: 437 10*3/uL — ABNORMAL HIGH (ref 140–400)
RBC: 3.78 10*6/uL — ABNORMAL LOW (ref 4.20–5.80)
RDW: 13.7 % (ref 11.0–15.0)
WBC: 11.8 10*3/uL — ABNORMAL HIGH (ref 3.8–10.8)

## 2024-01-30 LAB — IRON,TIBC AND FERRITIN PANEL
%SAT: 7 % — ABNORMAL LOW (ref 20–48)
Ferritin: 18 ng/mL — ABNORMAL LOW (ref 24–380)
Iron: 29 ug/dL — ABNORMAL LOW (ref 50–180)
TIBC: 414 ug/dL (ref 250–425)

## 2024-01-30 MED ORDER — TELMISARTAN 40 MG PO TABS: 40.0000 mg | ORAL_TABLET | Freq: Every day | ORAL | Status: AC

## 2024-01-30 NOTE — Progress Notes (Signed)
 Careteam: Patient Care Team: Tye Gall, MD as PCP - General (Internal Medicine) Florencio Hunting, MD as Consulting Physician (Urology) Linard Reno, MD as Consulting Physician (Ophthalmology) Corie Diamond, MD as Consulting Physician (Ophthalmology) Nathaniel Bald, M.D., PA  PLACE OF SERVICE:  [facility]  Advanced Directive information    No Known Allergies  Chief Complaint  Patient presents with   Hospitalization Follow-up    Hospital follow up for GI bleed  rectal bleeding on 01/16/24, pt states that has had any more issues with bleeding.      Discussed the use of AI scribe software for clinical note transcription with the patient, who gave verbal consent to proceed.  History of Present Illness    Nicholas Rivera is an 83 year old male who presents for follow-up after hospitalization for gastrointestinal bleeding.  He was hospitalized for lower gastrointestinal bleeding. During his hospital stay, he received two units of blood transfusion due to significant blood loss. He was discharged approximately ten days ago and has not noticed any further bleeding since then.  He resumed taking Eliquis while still in the hospital. No abdominal pain, nausea, vomiting, dizziness, or lightheadedness. However, he feels 'awful tired at times.'  His current medications include Eliquis 5 mg twice daily, diltiazem, finasteride , levothyroxine , telmisartan 40 mg, metformin  500 mg once daily, rosuvastatin  10 mg, sotalol, and Flomax. His urologist recently changed his blood pressure medication from losartan  to telmisartan.     He is awaiting a follow-up appointment with a gastroenterologist, which was delayed due to a holiday.  As per D/c summary  ''Admit Date: 01/16/2024 Discharge Date: 01/21/24  Discharge Diagnosis   Principal Problem: Acute lower GI hemorrhage (POA: Yes) Active Problems: Chronic atrial fibrillation (CMS/HCC) (POA: Yes) Hypertension (POA:  Yes) Hyperlipidemia (POA: Yes) Type 2 diabetes mellitus, without long-term current use of insulin (CMS/HCC) (POA: Yes) Hypothyroidism (POA: Yes) Anticoagulant long-term use (POA: Not Applicable) Anemia due to blood loss, acute (POA: Yes) Resolved Problems: * No resolved hospital problems. [facility] Course   The patient is felt to have met maximal medical benefit from hospitalization and will be discharged in stable condition.  Hospital problems and treatment course are outlined below.   The patient is an 83 year old male with a history of chronic atrial fibrillation on apixaban therapy, hypertension, hyperlipidemia, coronary artery disease, type 2 diabetes mellitus, renal cell carcinoma, BPH, hypothyroidism, obesity, and other medical comorbidities who presented to the emergency department by EMS after the patient had a large amount of bright red blood per rectum. Symptoms began approximately 2 hours prior to presentation. Stat CT angiogram of the abdomen and pelvis showed bleeding at the junction of the descending and sigmoid colon. At the time arrival he was noted to have power with active ongoing bleeding. Emergent GI consultation was placed and decision was made to proceed with urgent colonoscopy. Apixaban reversal was initiated with Kcentra. Due to ongoing hypotension the decision was made to admit the patient to the intensive care unit after colonoscopy. Colonoscopy showed bleeding diverticula which was treated with 3 clips and hemostasis. The patient was noted to have acute blood loss anemia. He required transfusion with 2 units of packed red blood cells as well as 1 unit of FFP and 1 platelet pheresis unit. The patient no signs of further bleeding after his procedure. He was cleared to restart apixaban therapy by Gastroenterology on 01/18/2024. Patient tolerated anticoagulation well with no signs of further bleeding. He was transferred to the medical floor for  further treatment and care. The  patient's acute blood loss anemia stabilized. He was noted to be hemodynamically stable with improved blood pressures. By the morning of 01/21/2024 he was overall feeling better. He does report mild ongoing generalized weakness. No signs of further GI hemorrhage. The patient is medically stable for discharge home today with close outpatient follow-up with his primary care provider and Gastroenterology. We would recommend repeat blood counts at his 1st medical follow-up. The patient should seek immediate medical care if he has any signs of further active bleeding. I have spent 35 minutes in discharge of this patient including direct patient care and counseling and preparation of his complex discharge plan.  Hospital procedures: CT angiogram abdomen and pelvis with and without contrast 01/16/2024, Colonoscopy by 13 2025, transfusion packed red blood cells/FFP/platelet pheresis unit  Discharge disposition: Home.  Hospital follow up items: Follow-up with primary care provider. Repeat blood counts at 1st follow-up appointment. ''  Review of Systems:  Review of Systems  Constitutional:  Positive for malaise/fatigue. Negative for chills and fever.  HENT:  Negative for congestion and sore throat.   Eyes:  Negative for double vision.  Respiratory:  Negative for cough, sputum production and shortness of breath.   Cardiovascular:  Negative for chest pain, palpitations and leg swelling.  Gastrointestinal:  Negative for abdominal pain, heartburn and nausea.  Genitourinary:  Negative for dysuria, frequency and hematuria.  Musculoskeletal:  Negative for falls and myalgias.  Neurological:  Negative for dizziness, sensory change and focal weakness.   Negative unless indicated in HPI.   Past Medical History:  Diagnosis Date   Atrial fibrillation (HCC)    Back pain, lumbosacral 09/05/2012   CAD (coronary artery disease), native coronary artery    Cancer of kidney (HCC) 09/05/2010   Cataract 09/06/2011    Diabetes mellitus without complication (HCC) 09/05/2010   Hypertension 09/05/2010   Nephrolithiasis 09/05/2010   Obesity, unspecified    Other and unspecified hyperlipidemia 04/03/2013   Pain, joint, ankle and foot    Unspecified hypothyroidism 09/05/2010   Past Surgical History:  Procedure Laterality Date   ANKLE SURGERY     CATARACT EXTRACTION W/ INTRAOCULAR LENS IMPLANT Bilateral 03/05/2016   Dr. Danley Dusky   CORONARY ANGIOPLASTY WITH STENT PLACEMENT     CYST REMOVAL TRUNK  11/03/2017   KIDNEY SURGERY  12/2010   had cancer removed; Dr. Pervis Branch CUFF REPAIR     bilateral; Dr. Brunilda Capra   Social History:   reports that he has never smoked. He has never used smokeless tobacco. He reports that he does not drink alcohol and does not use drugs.  History reviewed. No pertinent family history.  Medications: Patient's Medications  New Prescriptions   No medications on file  Previous Medications   APIXABAN (ELIQUIS) 5 MG TABS TABLET    Take 5 mg by mouth 2 (two) times daily.   ASCORBIC ACID (VITAMIN C) 1000 MG TABLET    Take 1,000 mg by mouth daily.   BLOOD GLUCOSE MONITORING SUPPL (ONE TOUCH ULTRA 2) W/DEVICE KIT    Use to test blood sugar daily. Dx: E11.69   CHOLECALCIFEROL (VITAMIN D3) 50 MCG (2000 UT) TABS    Take 1 tablet by mouth daily.   DILTIAZEM (DILACOR XR) 240 MG 24 HR CAPSULE    Take 120 mg by mouth daily.   FINASTERIDE  (PROSCAR ) 5 MG TABLET    Take 5 mg by mouth daily.   GLUCOSE BLOOD (ONETOUCH ULTRA) TEST STRIP  CHECK BLOOD SUGAR DAILY   LEVOTHYROXINE  (SYNTHROID ) 50 MCG TABLET    TAKE 1 TABLET BY MOUTH DAILY  BEFORE BREAKFAST   LOSARTAN  (COZAAR ) 25 MG TABLET    Take 25 mg by mouth daily.   METFORMIN  (GLUCOPHAGE ) 500 MG TABLET    TAKE 1 TABLET BY MOUTH DAILY TO CONTROL  DIABETES   MULTIPLE VITAMINS-MINERALS (MENS MULTIVITAMIN PO)    Take 1 tablet by mouth daily.   OMEPRAZOLE  (PRILOSEC) 20 MG CAPSULE    TAKE 1 CAPSULE BY MOUTH DAILY   ONETOUCH DELICA LANCETS FINE MISC     USE TO CHECK BLOOD SUGAR ONCE DAILY   ROSUVASTATIN  (CRESTOR ) 10 MG TABLET    Take 10 mg by mouth daily.   SOTALOL (BETAPACE) 80 MG TABLET    Take 80 mg by mouth 2 (two) times daily.   TAMSULOSIN (FLOMAX) 0.4 MG CAPS CAPSULE    Take 0.4 mg by mouth daily.  Modified Medications   No medications on file  Discontinued Medications   ASPIRIN  81 PO    Take by mouth.    Physical Exam: Vitals:   01/30/24 0856  BP: (!) 140/78  Pulse: 60  Temp: 98.1 F (36.7 C)  TempSrc: Temporal  SpO2: 98%  Weight: 215 lb 6.4 oz (97.7 kg)  Height: 5\' 5"  (1.651 m)   Body mass index is 35.84 kg/m. BP Readings from Last 3 Encounters:  01/30/24 (!) 140/78  11/21/23 110/80  10/04/23 126/70   Wt Readings from Last 3 Encounters:  01/30/24 215 lb 6.4 oz (97.7 kg)  11/21/23 217 lb 6.4 oz (98.6 kg)  10/04/23 207 lb 6.4 oz (94.1 kg)    Physical Exam Constitutional:      Appearance: Normal appearance.  HENT:     Head: Normocephalic and atraumatic.  Cardiovascular:     Rate and Rhythm: Normal rate and regular rhythm.     Pulses: Normal pulses.     Heart sounds: Normal heart sounds.  Pulmonary:     Effort: No respiratory distress.     Breath sounds: No stridor. No wheezing or rales.  Abdominal:     General: Bowel sounds are normal. There is no distension.     Palpations: Abdomen is soft.     Tenderness: There is no abdominal tenderness. There is no right CVA tenderness or guarding.  Musculoskeletal:        General: No swelling.  Neurological:     Mental Status: He is alert. Mental status is at baseline.     Motor: No weakness.     Labs reviewed: Basic Metabolic Panel: Recent Labs    03/22/23 0959 07/26/23 0939 10/04/23 1107 11/21/23 0951  NA 139  --  139  --   K 4.7  --  4.6  --   CL 105  --  105  --   CO2 27  --  24  --   GLUCOSE 105*  --  101*  --   BUN 16  --  15  --   CREATININE 1.03  --  1.07  --   CALCIUM  9.6  --  9.7  --   TSH  --  1.79  --  3.38   Liver Function  Tests: Recent Labs    03/22/23 0959 10/04/23 1107  AST 15 21  ALT 15 28  BILITOT 0.5 0.4  PROT 6.5 6.6   No results for input(s): "LIPASE", "AMYLASE" in the last 8760 hours. No results for input(s): "AMMONIA" in the last 8760  hours. CBC: Recent Labs    10/04/23 1107  WBC 9.4  NEUTROABS 5,386  HGB 13.1*  HCT 41.6  MCV 89.5  PLT 311   Lipid Panel: Recent Labs    03/22/23 0959  CHOL 144  HDL 53  LDLCALC 66  TRIG 175*  CHOLHDL 2.7   TSH: Recent Labs    07/26/23 0939 11/21/23 0951  TSH 1.79 3.38   A1C: Lab Results  Component Value Date   HGBA1C 6.3 (H) 11/21/2023    Assessment and Plan Assessment & Plan   1. Gastrointestinal hemorrhage associated with intestinal diverticulitis (Primary)  No further episodes of bleeding Will check labs Instructed to follow up with GI - CBC (no diff) - Iron, TIBC and Ferritin Panel  2. Hospital discharge follow-up  No further episodes of bleeding Resumed eliquis - CBC (no diff) - Iron, TIBC and Ferritin Panel  3. Anemia, unspecified type  CBC (no diff) - Iron, TIBC and Ferritin Panel  4. Type 2 diabetes mellitus with other specified complication, without long-term current use of insulin (HCC)  Resumed metformin  Diabetic foot exam done today  5. Atrial fibrillation, unspecified type (HCC)  Cont with eliquis Repeat bp   Other orders - telmisartan (MICARDIS) 40 MG tablet; Take 1 tablet (40 mg total) by mouth daily.

## 2024-01-31 ENCOUNTER — Other Ambulatory Visit: Payer: Self-pay | Admitting: Sports Medicine

## 2024-01-31 ENCOUNTER — Ambulatory Visit: Payer: Self-pay | Admitting: Sports Medicine

## 2024-01-31 MED ORDER — IRON (FERROUS SULFATE) 325 (65 FE) MG PO TABS: 325.0000 mg | ORAL_TABLET | ORAL | 0 refills | Status: DC

## 2024-01-31 NOTE — Progress Notes (Signed)
    Latest Ref Rng & Units 01/30/2024    9:41 AM 10/04/2023   11:07 AM 11/26/2020   12:00 AM  CBC  WBC 3.8 - 10.8 Thousand/uL 11.8  9.4  7.9   Hemoglobin 13.2 - 17.1 g/dL 40.9  81.1  91.4   Hematocrit 38.5 - 50.0 % 33.4  41.6  42.1   Platelets 140 - 400 Thousand/uL 437  311  266     Iron /TIBC/Ferritin/ %Sat    Component Value Date/Time   IRON  29 (L) 01/30/2024 0941   TIBC 414 01/30/2024 0941   FERRITIN 18 (L) 01/30/2024 0941   IRONPCTSAT 7 (L) 01/30/2024 0941    Will start iron  supplements

## 2024-02-13 DIAGNOSIS — D509 Iron deficiency anemia, unspecified: Secondary | ICD-10-CM | POA: Diagnosis not present

## 2024-02-13 DIAGNOSIS — Z8719 Personal history of other diseases of the digestive system: Secondary | ICD-10-CM | POA: Diagnosis not present

## 2024-02-14 DIAGNOSIS — N3000 Acute cystitis without hematuria: Secondary | ICD-10-CM | POA: Diagnosis not present

## 2024-02-29 DIAGNOSIS — N3000 Acute cystitis without hematuria: Secondary | ICD-10-CM | POA: Diagnosis not present

## 2024-03-18 ENCOUNTER — Telehealth: Payer: Self-pay

## 2024-03-18 DIAGNOSIS — E1169 Type 2 diabetes mellitus with other specified complication: Secondary | ICD-10-CM

## 2024-03-18 MED ORDER — ACCU-CHEK AVIVA PLUS W/DEVICE KIT: 1.0000 | PACK | Freq: Every day | 12 refills | Status: AC

## 2024-03-18 MED ORDER — ACCU-CHEK AVIVA PLUS VI STRP: 1.0000 | ORAL_STRIP | Freq: Every day | 12 refills | Status: DC

## 2024-03-18 MED ORDER — ACCU-CHEK SOFTCLIX LANCETS MISC: 1.0000 | Freq: Every day | 12 refills | Status: AC

## 2024-03-18 NOTE — Telephone Encounter (Signed)
 Copied from CRM [id number]. Topic: Clinical - Prescription Issue >> Mar 18, 2024  1:41 PM Cherylann RAMAN wrote: Reason for CRM: Patient's insurance will no longer cover his diabetic equipment that he is currently using. They offered some alternatives: Contour or Accucheck. Patient would need the glucometer and test strips. Patient would like for provider to call in one of the new ones to his preferred pharmacy of  [facility] - [place], [place] - [phone] [address] [place] [address] Phone: [phone] Fax: [phone] Hours: Not open 24 hours  Please contact patient for additional information if needed at [phone]

## 2024-03-20 ENCOUNTER — Telehealth: Payer: Self-pay

## 2024-03-20 DIAGNOSIS — E1169 Type 2 diabetes mellitus with other specified complication: Secondary | ICD-10-CM

## 2024-03-20 NOTE — Telephone Encounter (Signed)
 Copied from CRM [id number]. Topic: Clinical - Prescription Issue >> Mar 20, 2024 12:36 PM Diannia H wrote: Reason for CRM: Patient called because everything was sent in for his diabetics except the blood monitor, he doesn't have anything to tell him what his sugar is,  patient would need the glucometer and test strips. , could you please assist? Patients callback number is [phone]. The preferred pharmacy is [facility] - [place], [place] - [address] [place] [address] Phone: [phone] Fax: [phone] Hours: Not open 24 hours

## 2024-03-20 NOTE — Telephone Encounter (Signed)
 All three scripts were sent in at the same time and are visible on the patients medication list. Spoke with patient, patient states he was told the lancets were received and nothing else. Patient aware all three sent and aware I will call the pharmacy to see what the status is.    Called the pharmacy, spoke with Garrel and the accu-chek aviva is no longer being made and they had called patient to see if ok to change to the accu-chek guide and had not heard back. I gave Garrel the verbal ok to change and updated medication list    Patient aware of the above interaction

## 2024-04-02 ENCOUNTER — Encounter: Payer: Self-pay | Admitting: Sports Medicine

## 2024-04-02 ENCOUNTER — Ambulatory Visit (INDEPENDENT_AMBULATORY_CARE_PROVIDER_SITE_OTHER): Admitting: Sports Medicine

## 2024-04-02 VITALS — BP 130/70 | HR 62 | Temp 97.0°F | Resp 17 | Ht 65.0 in | Wt 218.2 lb

## 2024-04-02 DIAGNOSIS — E039 Hypothyroidism, unspecified: Secondary | ICD-10-CM | POA: Diagnosis not present

## 2024-04-02 DIAGNOSIS — I4891 Unspecified atrial fibrillation: Secondary | ICD-10-CM

## 2024-04-02 DIAGNOSIS — I10 Essential (primary) hypertension: Secondary | ICD-10-CM

## 2024-04-02 DIAGNOSIS — E1169 Type 2 diabetes mellitus with other specified complication: Secondary | ICD-10-CM | POA: Diagnosis not present

## 2024-04-02 DIAGNOSIS — N400 Enlarged prostate without lower urinary tract symptoms: Secondary | ICD-10-CM

## 2024-04-02 DIAGNOSIS — D509 Iron deficiency anemia, unspecified: Secondary | ICD-10-CM

## 2024-04-02 LAB — CBC
HCT: 41.8 % (ref 38.5–50.0)
Hemoglobin: 13 g/dL — ABNORMAL LOW (ref 13.2–17.1)
MCH: 28 pg (ref 27.0–33.0)
MCHC: 31.1 g/dL — ABNORMAL LOW (ref 32.0–36.0)
MCV: 89.9 fL (ref 80.0–100.0)
MPV: 10.5 fL (ref 7.5–12.5)
Platelets: 269 Thousand/uL (ref 140–400)
RBC: 4.65 Million/uL (ref 4.20–5.80)
RDW: 14.2 % (ref 11.0–15.0)
WBC: 9.1 Thousand/uL (ref 3.8–10.8)

## 2024-04-02 NOTE — Progress Notes (Signed)
 Careteam: Patient Care Team: Sherlynn Madden, MD as PCP - General (Internal Medicine) Renda Glance, MD as Consulting Physician (Urology) Jarold Mayo, MD as Consulting Physician (Ophthalmology) Camillo Golas, MD as Consulting Physician (Ophthalmology) Golas DOROTHA Camillo, M.D., PA  PLACE OF SERVICE:  [facility]  Advanced Directive information Does Patient Have a Medical Advance Directive?: Yes, Type of Advance Directive: Healthcare Power of [place];Living will, Does patient want to make changes to medical advance directive?: No - Patient declined  No Known Allergies  Chief Complaint  Patient presents with   Medical Management of Chronic Issues    4 month follow up. Discuss the need for Covid Booster.     Discussed the use of AI scribe software for clinical note transcription with the patient, who gave verbal consent to proceed.  History of Present Illness  Nicholas Rivera is an 83 year old male who presents for follow-up    He was hospitalized in May for GI bleeding, he has not experienced any further episodes of gastrointestinal bleeding since his last hospital visit. His hemoglobin was low post-hospitalization. No abdominal pain, nausea, vomiting, fever, or chills are reported.  He denies any chest pain, shortness of breath, or dizziness during daily activities, except in hot weather. He engages in daily walking at a moderate pace for about 30 minutes.  His current medications include Eliquis, diltiazem, iron  tablets, finasteride , levothyroxine , metformin , rosuvastatin , sotalol, telmisartan , and Zoloft. He takes most medications in the morning, with Eliquis and Zoloft taken at night. He recently switched blood sugar monitoring devices due to insurance changes and is adjusting to the new system, with blood sugars running around 120 mg/dL.  He lives independently and manages his daily activities, including driving and household chores. He prefers direct communication for  lab results. No episodes of depression, anxiety, or falls since his last visit.    Review of Systems:  Review of Systems  Constitutional:  Negative for chills and fever.  HENT:  Negative for congestion and sore throat.   Eyes:  Negative for double vision.  Respiratory:  Negative for cough, sputum production and shortness of breath.   Cardiovascular:  Negative for chest pain, palpitations and leg swelling.  Gastrointestinal:  Negative for abdominal pain, heartburn and nausea.  Genitourinary:  Negative for dysuria, frequency and hematuria.  Musculoskeletal:  Negative for falls and myalgias.  Neurological:  Negative for dizziness, sensory change and focal weakness.   Negative unless indicated in HPI.   Past Medical History:  Diagnosis Date   Atrial fibrillation (HCC)    Back pain, lumbosacral 09/05/2012   CAD (coronary artery disease), native coronary artery    Cancer of kidney (HCC) 09/05/2010   Cataract 09/06/2011   Diabetes mellitus without complication (HCC) 09/05/2010   Hypertension 09/05/2010   Nephrolithiasis 09/05/2010   Obesity, unspecified    Other and unspecified hyperlipidemia 04/03/2013   Pain, joint, ankle and foot    Unspecified hypothyroidism 09/05/2010   Past Surgical History:  Procedure Laterality Date   ANKLE SURGERY     CATARACT EXTRACTION W/ INTRAOCULAR LENS IMPLANT Bilateral 03/05/2016   Dr. Camillo   CORONARY ANGIOPLASTY WITH STENT PLACEMENT     CYST REMOVAL TRUNK  11/03/2017   KIDNEY SURGERY  12/2010   had cancer removed; Dr. Renda FRIED CUFF REPAIR     bilateral; Dr. Kay   Social History:   reports that he has never smoked. He has never used smokeless tobacco. He reports that he does not drink alcohol and  does not use drugs.  No family history on file.  Medications: Patient's Medications  New Prescriptions   No medications on file  Previous Medications   ACCU-CHEK SOFTCLIX LANCETS LANCETS    1 each by Other route daily. E11.69    APIXABAN (ELIQUIS) 5 MG TABS TABLET    Take 5 mg by mouth 2 (two) times daily.   ASCORBIC ACID (VITAMIN C) 1000 MG TABLET    Take 1,000 mg by mouth daily.   BLOOD GLUCOSE MONITORING SUPPL (ACCU-CHEK AVIVA PLUS) W/DEVICE KIT    1 Device by Does not apply route daily. E11.69   BLOOD GLUCOSE MONITORING SUPPL (ACCU-CHEK GUIDE ME) W/DEVICE KIT    1 Device by Does not apply route daily. E11.69   CHOLECALCIFEROL (VITAMIN D3) 50 MCG (2000 UT) TABS    Take 1 tablet by mouth daily.   DILTIAZEM (DILACOR XR) 240 MG 24 HR CAPSULE    Take 120 mg by mouth daily.   FINASTERIDE  (PROSCAR ) 5 MG TABLET    Take 5 mg by mouth daily.   GLUCOSE BLOOD (ACCU-CHEK GUIDE TEST) TEST STRIP    1 each by Other route daily. E11.69   IRON , FERROUS SULFATE , 325 (65 FE) MG TABS    Take 325 mg by mouth every other day.   LEVOTHYROXINE  (SYNTHROID ) 50 MCG TABLET    TAKE 1 TABLET BY MOUTH DAILY  BEFORE BREAKFAST   METFORMIN  (GLUCOPHAGE ) 500 MG TABLET    TAKE 1 TABLET BY MOUTH DAILY TO CONTROL  DIABETES   MULTIPLE VITAMINS-MINERALS (MENS MULTIVITAMIN PO)    Take 1 tablet by mouth daily.   OMEPRAZOLE  (PRILOSEC) 20 MG CAPSULE    TAKE 1 CAPSULE BY MOUTH DAILY   ROSUVASTATIN  (CRESTOR ) 10 MG TABLET    Take 10 mg by mouth daily.   SOTALOL (BETAPACE) 80 MG TABLET    Take 80 mg by mouth 2 (two) times daily.   TAMSULOSIN (FLOMAX) 0.4 MG CAPS CAPSULE    Take 0.4 mg by mouth daily.   TELMISARTAN  (MICARDIS ) 40 MG TABLET    Take 1 tablet (40 mg total) by mouth daily.  Modified Medications   No medications on file  Discontinued Medications   No medications on file    Physical Exam: Vitals:   04/02/24 0953  BP: 130/70  Pulse: 62  Resp: 17  Temp: (!) 97 F (36.1 C)  SpO2: 96%  Weight: 218 lb 3.2 oz (99 kg)  Height: 5' 5 (1.651 m)   Body mass index is 36.31 kg/m. BP Readings from Last 3 Encounters:  04/02/24 130/70  01/30/24 (!) 142/78  11/21/23 110/80   Wt Readings from Last 3 Encounters:  04/02/24 218 lb 3.2 oz (99 kg)   01/30/24 215 lb 6.4 oz (97.7 kg)  11/21/23 217 lb 6.4 oz (98.6 kg)    Physical Exam Constitutional:      Appearance: Normal appearance.  HENT:     Head: Normocephalic and atraumatic.  Cardiovascular:     Rate and Rhythm: Normal rate and regular rhythm.     Pulses: Normal pulses.     Heart sounds: Normal heart sounds.  Pulmonary:     Effort: No respiratory distress.     Breath sounds: No stridor. No wheezing or rales.  Abdominal:     General: Bowel sounds are normal. There is no distension.     Palpations: Abdomen is soft.     Tenderness: There is no abdominal tenderness. There is no guarding.  Musculoskeletal:  General: No swelling.  Neurological:     Mental Status: He is alert. Mental status is at baseline.     Motor: No weakness.     Labs reviewed: Basic Metabolic Panel: Recent Labs    07/26/23 0939 10/04/23 1107 11/21/23 0951  NA  --  139  --   K  --  4.6  --   CL  --  105  --   CO2  --  24  --   GLUCOSE  --  101*  --   BUN  --  15  --   CREATININE  --  1.07  --   CALCIUM   --  9.7  --   TSH 1.79  --  3.38   Liver Function Tests: Recent Labs    10/04/23 1107  AST 21  ALT 28  BILITOT 0.4  PROT 6.6   No results for input(s): LIPASE, AMYLASE in the last 8760 hours. No results for input(s): AMMONIA in the last 8760 hours. CBC: Recent Labs    10/04/23 1107 01/30/24 0941  WBC 9.4 11.8*  NEUTROABS 5,386  --   HGB 13.1* 10.6*  HCT 41.6 33.4*  MCV 89.5 88.4  PLT 311 437*   Lipid Panel: No results for input(s): CHOL, HDL, LDLCALC, TRIG, CHOLHDL, LDLDIRECT in the last 8760 hours. TSH: Recent Labs    07/26/23 0939 11/21/23 0951  TSH 1.79 3.38   A1C: Lab Results  Component Value Date   HGBA1C 6.3 (H) 11/21/2023    Assessment and Plan Assessment & Plan   Atrial fibrillation Heart rate well-controlled with current medication. - Continue sotalol. - Continue diltiazem.  Iron  deficiency anemia Pt followed with GI  since discharge and recommended him to follow up in 6 months   No recent GI bleeding or related symptoms. Will order CBC  Hypertension Blood pressure well-controlled at 130/70 mmHg. - Continue telmisartan .  Type 2 diabetes mellitus Blood sugar well-controlled. A1c at 5.9 indicates good control. - Continue metformin . - Monitor blood sugar levels regularly.  Hyperlipidemia Currently taking rosuvastatin  in the morning. - Switch rosuvastatin  dosing to evening.  Hypothyroidism Current medication effective. - Continue levothyroxine .  Benign prostatic hyperplasia with lower urinary tract symptoms No urinary symptoms. Current medication effective. - Continue finasteride . - Continue tamsulosin.

## 2024-04-03 ENCOUNTER — Ambulatory Visit: Payer: Self-pay | Admitting: Sports Medicine

## 2024-04-03 NOTE — Telephone Encounter (Signed)
    Latest Ref Rng & Units 04/02/2024   10:30 AM 01/30/2024    9:41 AM 10/04/2023   11:07 AM  CBC  WBC 3.8 - 10.8 Thousand/uL 9.1  11.8  9.4   Hemoglobin 13.2 - 17.1 g/dL 86.9  89.3  86.8   Hematocrit 38.5 - 50.0 % 41.8  33.4  41.6   Platelets 140 - 400 Thousand/uL 269  437  311

## 2024-05-28 LAB — LAB REPORT - SCANNED
A1c: 6.5
Creatinine, POC: 109.6 mg/dL
EGFR: 71
Microalb Creat Ratio: 6.4
Microalbumin, Urine: 0.7

## 2024-06-06 ENCOUNTER — Telehealth: Payer: Self-pay

## 2024-06-06 NOTE — Telephone Encounter (Signed)
 Nicholas Rivera is calling back to check if medical clearance form was received. [name] from [facility] confirmed.

## 2024-06-06 NOTE — Telephone Encounter (Signed)
 Patient is in dental chair now and needs to have a tooth extracted. Cindy with [facility] was calling to get the ok. Form to be faxed as well.  Dorthea stated if we can get Sherlynn Madden, MD to give the verbal ok we can have any in office provider sign on her behalf.

## 2024-06-06 NOTE — Telephone Encounter (Signed)
 Sherlynn Madden, MD to Me  Nicholas Greig BRAVO, NP  (Selected Message)     06/06/24  3:10 PM Ok for 1 tooth extraction.  As per chart review it shows he is on anti coagulation, if he is getting multiple extractions then he has bleeding risk  Dr.Veludandi called me as well to reiterate the above and to emphasize that if it is 1 (one) tooth ok with no restrictions, however if more than one patient will need to reschedule extraction.   I called [facility] and spoke with representative that confirmed that it is only 1 (one) tooth. I then asked Greig Gil, NP to sign Clearance form in Dr.Veludandi's absence (offsite). Form was signed and faxed to [phone]

## 2024-06-16 ENCOUNTER — Other Ambulatory Visit: Payer: Self-pay | Admitting: Sports Medicine

## 2024-06-16 DIAGNOSIS — E119 Type 2 diabetes mellitus without complications: Secondary | ICD-10-CM

## 2024-06-19 DIAGNOSIS — I4891 Unspecified atrial fibrillation: Secondary | ICD-10-CM | POA: Diagnosis not present

## 2024-06-19 DIAGNOSIS — R609 Edema, unspecified: Secondary | ICD-10-CM | POA: Diagnosis not present

## 2024-06-19 DIAGNOSIS — E785 Hyperlipidemia, unspecified: Secondary | ICD-10-CM | POA: Diagnosis not present

## 2024-06-19 DIAGNOSIS — I2510 Atherosclerotic heart disease of native coronary artery without angina pectoris: Secondary | ICD-10-CM | POA: Diagnosis not present

## 2024-06-19 DIAGNOSIS — I10 Essential (primary) hypertension: Secondary | ICD-10-CM | POA: Diagnosis not present

## 2024-06-28 DIAGNOSIS — R3912 Poor urinary stream: Secondary | ICD-10-CM | POA: Diagnosis not present

## 2024-07-03 ENCOUNTER — Encounter: Payer: Self-pay | Admitting: Sports Medicine

## 2024-07-03 ENCOUNTER — Ambulatory Visit: Admitting: Sports Medicine

## 2024-07-03 VITALS — BP 130/84 | HR 64 | Temp 97.8°F | Ht 65.0 in | Wt 221.0 lb

## 2024-07-03 DIAGNOSIS — E039 Hypothyroidism, unspecified: Secondary | ICD-10-CM | POA: Diagnosis not present

## 2024-07-03 DIAGNOSIS — I10 Essential (primary) hypertension: Secondary | ICD-10-CM | POA: Diagnosis not present

## 2024-07-03 DIAGNOSIS — K219 Gastro-esophageal reflux disease without esophagitis: Secondary | ICD-10-CM | POA: Diagnosis not present

## 2024-07-03 DIAGNOSIS — N400 Enlarged prostate without lower urinary tract symptoms: Secondary | ICD-10-CM

## 2024-07-03 DIAGNOSIS — I4891 Unspecified atrial fibrillation: Secondary | ICD-10-CM

## 2024-07-03 DIAGNOSIS — E1169 Type 2 diabetes mellitus with other specified complication: Secondary | ICD-10-CM

## 2024-07-03 NOTE — Progress Notes (Signed)
 Careteam: Patient Care Team: Sherlynn Madden, MD as PCP - General (Internal Medicine) Renda Glance, MD as Consulting Physician (Urology) Jarold Mayo, MD as Consulting Physician (Ophthalmology) Camillo Golas, MD as Consulting Physician (Ophthalmology) Golas DOROTHA Camillo, M.D., PA  PLACE OF SERVICE:  [facility]  Advanced Directive information    No Known Allergies  Chief Complaint  Patient presents with   Medical Management of Chronic Issues    3 month folllow up         History of Present Illness  Nicholas Rivera is an 83 year old male with atrial fibrillation and hypertension who presents for a follow-up visit.  Atrial fibrillation is managed with Eliquis 5 mg twice daily. No recent episodes of bleeding, hematuria, or melena. No palpitations or tachycardia. Cardizem was discontinued by his cardiologist due to lower extremity swelling.  He is taking telmisartan  for hypertension. He is also on Crestor  10 mg at night for hyperlipidemia management.  He is on Proscar  and Flomax for benign prostatic hyperplasia.  He walks two to three times a week but notes reduced activity due to ankle discomfort. He is concerned about weight gain due to decreased activity and frequent social meals at his residence.  He lives in a senior living community and participates in social activities.   No chest pain, dizziness, lightheadedness, or abdominal pain.      Review of Systems:  Review of Systems  Constitutional:  Negative for chills and fever.  Respiratory:  Negative for cough, sputum production and shortness of breath.   Cardiovascular:  Negative for chest pain, palpitations and leg swelling.  Gastrointestinal:  Negative for abdominal pain, heartburn and nausea.  Genitourinary:  Negative for dysuria, frequency and hematuria.  Neurological:  Negative for dizziness.   Negative unless indicated in HPI.   Past Medical History:  Diagnosis Date   Atrial fibrillation (HCC)     Back pain, lumbosacral 09/05/2012   CAD (coronary artery disease), native coronary artery    Cancer of kidney (HCC) 09/05/2010   Cataract 09/06/2011   Diabetes mellitus without complication (HCC) 09/05/2010   Hypertension 09/05/2010   Nephrolithiasis 09/05/2010   Obesity, unspecified    Other and unspecified hyperlipidemia 04/03/2013   Pain, joint, ankle and foot    Unspecified hypothyroidism 09/05/2010   Past Surgical History:  Procedure Laterality Date   ANKLE SURGERY     CATARACT EXTRACTION W/ INTRAOCULAR LENS IMPLANT Bilateral 03/05/2016   Dr. Camillo   CORONARY ANGIOPLASTY WITH STENT PLACEMENT     CYST REMOVAL TRUNK  11/03/2017   KIDNEY SURGERY  12/2010   had cancer removed; Dr. Renda FRIED CUFF REPAIR     bilateral; Dr. Kay   Social History:   reports that he has never smoked. He has never used smokeless tobacco. He reports that he does not drink alcohol and does not use drugs.  History reviewed. No pertinent family history.  Medications: Patient's Medications  New Prescriptions   No medications on file  Previous Medications   ACCU-CHEK SOFTCLIX LANCETS LANCETS    1 each by Other route daily. E11.69   APIXABAN (ELIQUIS) 5 MG TABS TABLET    Take 5 mg by mouth 2 (two) times daily.   ASCORBIC ACID (VITAMIN C) 1000 MG TABLET    Take 1,000 mg by mouth daily.   BLOOD GLUCOSE MONITORING SUPPL (ACCU-CHEK AVIVA PLUS) W/DEVICE KIT    1 Device by Does not apply route daily. E11.69   BLOOD GLUCOSE MONITORING SUPPL (ACCU-CHEK GUIDE ME) W/DEVICE  KIT    1 Device by Does not apply route daily. E11.69   CHOLECALCIFEROL (VITAMIN D3) 50 MCG (2000 UT) TABS    Take 1 tablet by mouth daily.   FINASTERIDE  (PROSCAR ) 5 MG TABLET    Take 5 mg by mouth daily.   GLUCOSE BLOOD (ACCU-CHEK GUIDE TEST) TEST STRIP    1 each by Other route daily. E11.69   LEVOTHYROXINE  (SYNTHROID ) 50 MCG TABLET    TAKE 1 TABLET BY MOUTH DAILY  BEFORE BREAKFAST   METFORMIN  (GLUCOPHAGE ) 500 MG TABLET    TAKE 1  TABLET BY MOUTH TWICE  DAILY TO CONTROL DIABETES   MULTIPLE VITAMINS-MINERALS (MENS MULTIVITAMIN PO)    Take 1 tablet by mouth daily.   NITROGLYCERIN (NITROSTAT) 0.4 MG SL TABLET    Place 0.4 mg under the tongue every 5 (five) minutes as needed for chest pain.   OMEPRAZOLE  (PRILOSEC) 20 MG CAPSULE    TAKE 1 CAPSULE BY MOUTH DAILY   ROSUVASTATIN  (CRESTOR ) 10 MG TABLET    Take 10 mg by mouth daily.   SOTALOL (BETAPACE) 80 MG TABLET    Take 80 mg by mouth 2 (two) times daily.   TAMSULOSIN (FLOMAX) 0.4 MG CAPS CAPSULE    Take 0.4 mg by mouth daily.   TELMISARTAN  (MICARDIS ) 40 MG TABLET    Take 1 tablet (40 mg total) by mouth daily.  Modified Medications   No medications on file  Discontinued Medications   DILTIAZEM (DILACOR XR) 240 MG 24 HR CAPSULE    Take 120 mg by mouth daily.    Physical Exam: Vitals:   07/03/24 0953  BP: 130/84  Pulse: 64  Temp: 97.8 F (36.6 C)  SpO2: 96%  Weight: 221 lb (100.2 kg)  Height: 5' 5 (1.651 m)   Body mass index is 36.78 kg/m. BP Readings from Last 3 Encounters:  07/03/24 130/84  04/02/24 130/70  01/30/24 (!) 142/78   Wt Readings from Last 3 Encounters:  07/03/24 221 lb (100.2 kg)  04/02/24 218 lb 3.2 oz (99 kg)  01/30/24 215 lb 6.4 oz (97.7 kg)    Physical Exam Constitutional:      Appearance: Normal appearance.  HENT:     Head: Normocephalic and atraumatic.  Cardiovascular:     Rate and Rhythm: Normal rate and regular rhythm.     Pulses: Normal pulses.     Heart sounds: Normal heart sounds.  Pulmonary:     Effort: No respiratory distress.     Breath sounds: No stridor. No wheezing or rales.  Abdominal:     General: Bowel sounds are normal. There is no distension.     Palpations: Abdomen is soft.     Tenderness: There is no abdominal tenderness. There is no guarding.  Musculoskeletal:        General: No swelling.  Neurological:     Mental Status: He is alert. Mental status is at baseline.     Sensory: No sensory deficit.      Motor: No weakness.     Labs reviewed: Basic Metabolic Panel: Recent Labs    07/26/23 0939 10/04/23 1107 11/21/23 0951  NA  --  139  --   K  --  4.6  --   CL  --  105  --   CO2  --  24  --   GLUCOSE  --  101*  --   BUN  --  15  --   CREATININE  --  1.07  --  CALCIUM   --  9.7  --   TSH 1.79  --  3.38   Liver Function Tests: Recent Labs    10/04/23 1107  AST 21  ALT 28  BILITOT 0.4  PROT 6.6   No results for input(s): LIPASE, AMYLASE in the last 8760 hours. No results for input(s): AMMONIA in the last 8760 hours. CBC: Recent Labs    10/04/23 1107 01/30/24 0941 04/02/24 1030  WBC 9.4 11.8* 9.1  NEUTROABS 5,386  --   --   HGB 13.1* 10.6* 13.0*  HCT 41.6 33.4* 41.8  MCV 89.5 88.4 89.9  PLT 311 437* 269   Lipid Panel: No results for input(s): CHOL, HDL, LDLCALC, TRIG, CHOLHDL, LDLDIRECT in the last 8760 hours. TSH: Recent Labs    07/26/23 0939 11/21/23 0951  TSH 1.79 3.38   A1C: Lab Results  Component Value Date   HGBA1C 6.3 (H) 11/21/2023    Assessment and Plan Assessment & Plan   1. Primary hypertension (Primary) At goal    Latest Ref Rng & Units 10/04/2023   11:07 AM 03/22/2023    9:59 AM 11/23/2022   11:53 AM  BMP  Glucose 65 - 99 mg/dL 898  894  881   BUN 7 - 25 mg/dL 15  16  15    Creatinine 0.70 - 1.22 mg/dL 8.92  8.96  8.89   BUN/Creat Ratio 6 - 22 (calc) SEE NOTE:  SEE NOTE:  SEE NOTE:   Sodium 135 - 146 mmol/L 139  139  139   Potassium 3.5 - 5.3 mmol/L 4.6  4.7  4.5   Chloride 98 - 110 mmol/L 105  105  102   CO2 20 - 32 mmol/L 24  27  24    Calcium  8.6 - 10.3 mg/dL 9.7  9.6  9.4       Atrial fibrillation, unspecified type (HCC) No signs of bleeding Cont with eliquis    Gastroesophageal reflux disease without esophagitis Stable Cont with prilosec   Acquired hypothyroidism Lab Results  Component Value Date   TSH 3.38 11/21/2023   Cont with synthyroid  Type 2 diabetes mellitus with other specified  complication, without long-term current use of insulin (HCC) Had recent labs drawn at [facility]  A1c at goal  Cont with metformin   Benign prostatic hyperplasia, unspecified whether lower urinary tract symptoms present Stable Cont with flomax, proscar   Other orders - nitroGLYCERIN (NITROSTAT) 0.4 MG SL tablet; Place 0.4 mg under the tongue every 5 (five) minutes as needed for chest pain.

## 2024-07-30 NOTE — Progress Notes (Signed)
 Nicholas Rivera                                          MRN: [id number]   07/30/2024   The [facility] Quality Team Specialist reviewed this patient medical record for the purposes of chart review for care gap closure. The following were reviewed: abstraction for care gap closure-kidney health evaluation for diabetes:eGFR  and uACR.    [facility] Quality Team

## 2024-09-02 LAB — OPHTHALMOLOGY REPORT-SCANNED

## 2024-09-10 ENCOUNTER — Other Ambulatory Visit: Payer: Self-pay | Admitting: Sports Medicine

## 2024-09-10 DIAGNOSIS — E119 Type 2 diabetes mellitus without complications: Secondary | ICD-10-CM

## 2024-09-11 NOTE — Telephone Encounter (Signed)
 Patient will not be following [facility] to [facility]. Please advise if ok to refill?

## 2024-09-12 NOTE — Telephone Encounter (Signed)
 Please schedule an appointment as soon as possible with one of the providers here at the office.  Will refill medication for 1 month until seen in office

## 2024-09-13 NOTE — Telephone Encounter (Signed)
 Patient states he will call back to schedule appointment. He was informed no further refills unless appt is scheduled. Patient voiced understanding.

## 2024-10-02 ENCOUNTER — Other Ambulatory Visit: Payer: Self-pay | Admitting: Nurse Practitioner

## 2024-10-02 DIAGNOSIS — E039 Hypothyroidism, unspecified: Secondary | ICD-10-CM

## 2024-11-04 ENCOUNTER — Ambulatory Visit: Admitting: Nurse Practitioner
